# Patient Record
Sex: Male | Born: 1975
Health system: Southern US, Community
[De-identification: ages and names within clinical notes are randomized; demographics above are authoritative.]

## PROBLEM LIST (undated history)

## (undated) ENCOUNTER — Emergency Department: Discharge status: Absent without leave | Payer: Self-pay

## (undated) DIAGNOSIS — I10 Essential (primary) hypertension: Secondary | ICD-10-CM

## (undated) DIAGNOSIS — T7840XA Allergy, unspecified, initial encounter: Secondary | ICD-10-CM

## (undated) DIAGNOSIS — G4730 Sleep apnea, unspecified: Secondary | ICD-10-CM

## (undated) DIAGNOSIS — J189 Pneumonia, unspecified organism: Secondary | ICD-10-CM

## (undated) DIAGNOSIS — E7401 von Gierke disease: Secondary | ICD-10-CM

## (undated) DIAGNOSIS — M1990 Unspecified osteoarthritis, unspecified site: Secondary | ICD-10-CM

## (undated) DIAGNOSIS — K602 Anal fissure, unspecified: Secondary | ICD-10-CM

## (undated) DIAGNOSIS — F329 Major depressive disorder, single episode, unspecified: Secondary | ICD-10-CM

## (undated) DIAGNOSIS — F32A Depression, unspecified: Secondary | ICD-10-CM

## (undated) DIAGNOSIS — F419 Anxiety disorder, unspecified: Secondary | ICD-10-CM

## (undated) DIAGNOSIS — E785 Hyperlipidemia, unspecified: Secondary | ICD-10-CM

## (undated) HISTORY — DX: Anxiety disorder, unspecified: F41.9

## (undated) HISTORY — DX: Unspecified osteoarthritis, unspecified site: M19.90

## (undated) HISTORY — DX: Hyperlipidemia, unspecified: E78.5

## (undated) HISTORY — DX: Von Gierke disease: E74.01

## (undated) HISTORY — DX: Allergy, unspecified, initial encounter: T78.40XA

## (undated) HISTORY — DX: Essential (primary) hypertension: I10

## (undated) HISTORY — DX: Anal fissure, unspecified: K60.2

## (undated) HISTORY — DX: Depression, unspecified: F32.A

## (undated) HISTORY — PX: APPENDECTOMY: SHX54

## (undated) HISTORY — DX: Sleep apnea, unspecified: G47.30

## (undated) HISTORY — DX: Major depressive disorder, single episode, unspecified: F32.9

---

## 1997-08-16 ENCOUNTER — Emergency Department (HOSPITAL_COMMUNITY): Admission: EM | Admit: 1997-08-16 | Discharge: 1997-08-16 | Payer: Self-pay | Admitting: Internal Medicine

## 1998-11-19 ENCOUNTER — Emergency Department (HOSPITAL_COMMUNITY): Admission: EM | Admit: 1998-11-19 | Discharge: 1998-11-19 | Payer: Self-pay | Admitting: Emergency Medicine

## 2001-11-07 ENCOUNTER — Emergency Department (HOSPITAL_COMMUNITY): Admission: EM | Admit: 2001-11-07 | Discharge: 2001-11-07 | Payer: Self-pay | Admitting: Emergency Medicine

## 2002-09-10 ENCOUNTER — Encounter: Admission: RE | Admit: 2002-09-10 | Discharge: 2002-09-10 | Payer: Self-pay | Admitting: Internal Medicine

## 2002-09-10 ENCOUNTER — Encounter: Payer: Self-pay | Admitting: Internal Medicine

## 2003-08-16 ENCOUNTER — Emergency Department (HOSPITAL_COMMUNITY): Admission: EM | Admit: 2003-08-16 | Discharge: 2003-08-17 | Payer: Self-pay | Admitting: Emergency Medicine

## 2003-08-22 ENCOUNTER — Encounter: Admission: RE | Admit: 2003-08-22 | Discharge: 2003-08-22 | Payer: Self-pay | Admitting: Internal Medicine

## 2004-04-12 ENCOUNTER — Encounter: Admission: RE | Admit: 2004-04-12 | Discharge: 2004-04-12 | Payer: Self-pay | Admitting: Internal Medicine

## 2005-04-18 ENCOUNTER — Ambulatory Visit (HOSPITAL_BASED_OUTPATIENT_CLINIC_OR_DEPARTMENT_OTHER): Admission: RE | Admit: 2005-04-18 | Discharge: 2005-04-18 | Payer: Self-pay | Admitting: General Surgery

## 2005-04-18 ENCOUNTER — Encounter (INDEPENDENT_AMBULATORY_CARE_PROVIDER_SITE_OTHER): Payer: Self-pay | Admitting: Specialist

## 2006-02-07 HISTORY — PX: CYSTECTOMY: SUR359

## 2007-12-02 ENCOUNTER — Emergency Department (HOSPITAL_BASED_OUTPATIENT_CLINIC_OR_DEPARTMENT_OTHER): Admission: EM | Admit: 2007-12-02 | Discharge: 2007-12-02 | Payer: Self-pay | Admitting: Emergency Medicine

## 2007-12-31 ENCOUNTER — Ambulatory Visit (HOSPITAL_BASED_OUTPATIENT_CLINIC_OR_DEPARTMENT_OTHER): Admission: RE | Admit: 2007-12-31 | Discharge: 2007-12-31 | Payer: Self-pay | Admitting: Unknown Physician Specialty

## 2008-10-07 ENCOUNTER — Emergency Department (HOSPITAL_BASED_OUTPATIENT_CLINIC_OR_DEPARTMENT_OTHER): Admission: EM | Admit: 2008-10-07 | Discharge: 2008-10-07 | Payer: Self-pay | Admitting: Emergency Medicine

## 2010-01-07 ENCOUNTER — Ambulatory Visit: Payer: Self-pay | Admitting: Cardiovascular Disease

## 2010-02-28 ENCOUNTER — Encounter: Payer: Self-pay | Admitting: Internal Medicine

## 2010-04-06 ENCOUNTER — Ambulatory Visit (INDEPENDENT_AMBULATORY_CARE_PROVIDER_SITE_OTHER): Payer: BC Managed Care – PPO | Admitting: Cardiovascular Disease

## 2010-04-06 DIAGNOSIS — I119 Hypertensive heart disease without heart failure: Secondary | ICD-10-CM

## 2010-05-15 LAB — CBC
HCT: 44.5 % (ref 39.0–52.0)
Hemoglobin: 15.2 g/dL (ref 13.0–17.0)
MCHC: 34.1 g/dL (ref 30.0–36.0)
MCV: 88.7 fL (ref 78.0–100.0)
RDW: 12.1 % (ref 11.5–15.5)

## 2010-05-15 LAB — GLUCOSE, CAPILLARY: Glucose-Capillary: 87 mg/dL (ref 70–99)

## 2010-05-15 LAB — DIFFERENTIAL
Basophils Absolute: 0.1 10*3/uL (ref 0.0–0.1)
Basophils Relative: 1 % (ref 0–1)
Eosinophils Relative: 1 % (ref 0–5)
Lymphocytes Relative: 23 % (ref 12–46)
Neutro Abs: 6 10*3/uL (ref 1.7–7.7)

## 2010-05-15 LAB — BASIC METABOLIC PANEL
BUN: 13 mg/dL (ref 6–23)
Chloride: 101 mEq/L (ref 96–112)
GFR calc Af Amer: >60 mL/min (ref >60)
GFR calc non Af Amer: >60 mL/min (ref >60)
Potassium: 4.1 mEq/L (ref 3.5–5.1)

## 2010-05-31 ENCOUNTER — Other Ambulatory Visit: Payer: Self-pay | Admitting: *Deleted

## 2010-05-31 DIAGNOSIS — E7800 Pure hypercholesterolemia, unspecified: Secondary | ICD-10-CM

## 2010-06-02 ENCOUNTER — Encounter: Payer: Self-pay | Admitting: Cardiovascular Disease

## 2010-06-02 DIAGNOSIS — F419 Anxiety disorder, unspecified: Secondary | ICD-10-CM | POA: Insufficient documentation

## 2010-06-02 DIAGNOSIS — F329 Major depressive disorder, single episode, unspecified: Secondary | ICD-10-CM | POA: Insufficient documentation

## 2010-06-08 ENCOUNTER — Telehealth: Payer: Self-pay | Admitting: *Deleted

## 2010-06-08 ENCOUNTER — Ambulatory Visit: Payer: BC Managed Care – PPO | Admitting: Cardiovascular Disease

## 2010-06-08 ENCOUNTER — Other Ambulatory Visit: Payer: BC Managed Care – PPO | Admitting: *Deleted

## 2010-06-08 NOTE — Telephone Encounter (Signed)
Sent missed visit and fasting lab letter.Alfonso Ramus RN

## 2010-06-25 NOTE — Op Note (Signed)
NAMEEUSTACE, HUR              ACCOUNT NO.:  [id number]   MEDICAL RECORD NO.:  [id number]          PATIENT TYPE:  AMB   LOCATION:  DSC                          FACILITY:  [facility]   PHYSICIAN:  Ollen Gross. Vernell Morgans, M.D. DATE OF BIRTH:  Oct 04, 1975   DATE OF PROCEDURE:  04/18/2005  DATE OF DISCHARGE:  04/18/2005                                 OPERATIVE REPORT   PREOPERATIVE DIAGNOSIS:  Sebaceous cyst of the left axilla.   POSTOPERATIVE DIAGNOSIS:  Sebaceous cyst of the left axilla.   PROCEDURE:  Excision of sebaceous cyst from left axilla.   SURGEON:  Ollen Gross. Carolynne Edouard, M.D.   ANESTHESIA:  General via LMA.   DESCRIPTION OF PROCEDURE:  After informed consent was obtained, the patient  was brought to the operating room and placed in the supine position on the  operating table.  After adequate induction of general anesthesia, the  patient's left axilla was prepped with Betadine and draped in the usual  sterile manner.  The mass in the left axilla was palpable.  An elliptical  skin incision was made overlying this mass.  This incision was carried down  through the skin and into the subcutaneous tissue of the axilla sharply with  the electrocautery. The mass itself was completely excised in a  circumferential manner from the rest of subcutaneous fatty tissue. Once it  was removed, it was sent to pathology for further evaluation.  Hemostasis  was achieved using Bovie electrocautery.  The wound was irrigated with  copious amounts of saline.  The deep layer of the wound was then closed with  interrupted 3-0 Vicryl stitches and the skin was with a closed with a  running for Monocryl subcuticular stitch. Dermabond dressing was applied.  The patient tolerated the procedure well.  At the end of the case, all  needle, sponge and instrument counts were correct.  The patient was then  awakened and taken to the recovery room in stable condition.      Ollen Gross. Vernell Morgans, M.D.  Electronically  Signed     PST/MEDQ  D:  04/19/2005  T:  04/20/2005  Job:  [id number]

## 2010-11-09 LAB — CBC: Hemoglobin: 14.5

## 2010-11-09 LAB — DIFFERENTIAL
Basophils Absolute: 0
Eosinophils Relative: 2
Lymphs Abs: 2.2
Monocytes Absolute: 0.6

## 2010-11-09 LAB — COMPREHENSIVE METABOLIC PANEL
ALT: 20
Alkaline Phosphatase: 68
CO2: 27
Calcium: 9.3
Chloride: 103
GFR calc Af Amer: >60
GFR calc non Af Amer: >60
Sodium: 139
Total Protein: 7.2

## 2010-11-09 LAB — CK: Total CK: 121

## 2010-11-09 LAB — LIPASE, BLOOD: Lipase: 178

## 2010-11-09 LAB — SEDIMENTATION RATE: Sed Rate: 0

## 2010-12-07 ENCOUNTER — Telehealth: Payer: Self-pay | Admitting: Nurse Practitioner

## 2010-12-07 NOTE — Telephone Encounter (Signed)
Paul Freeman called in this evening stating that he wasn't feeling 'great,' and checked his bp and noted that it was 160/110.  He's wondering what he should do.  i advised to take an additional lisinopril 20mg  x 1 now and call the office in the am to get an appt with lori gerhardt, np or dr. Elease Hashimoto in the near future as it has been some time since he was last seen.  He verbalized understanding and plans to call the office in am.

## 2010-12-08 ENCOUNTER — Telehealth: Payer: Self-pay | Admitting: Cardiovascular Disease

## 2010-12-08 MED ORDER — NEBIVOLOL HCL 20 MG PO TABS: 20.0000 mg | ORAL_TABLET | Freq: Two times a day (BID) | ORAL | Status: DC

## 2010-12-08 NOTE — Telephone Encounter (Signed)
Called, will discuss with dr Ian Bushman

## 2010-12-08 NOTE — Telephone Encounter (Signed)
Spoke wih pt and confirmed bp issues, spoke with dr Elease Hashimoto and he advised him to take an extra bystolic daily/ monitor bp daily and have f/u app. Pt advised to call with questions and concerns.

## 2010-12-08 NOTE — Telephone Encounter (Signed)
Pt has a elevated b/p last night at 8:30p it was 167/110 he has not taken this am. Pt having headaches and been hot.

## 2010-12-08 NOTE — Telephone Encounter (Signed)
Can you address

## 2010-12-29 ENCOUNTER — Ambulatory Visit (INDEPENDENT_AMBULATORY_CARE_PROVIDER_SITE_OTHER): Payer: BC Managed Care – PPO | Admitting: Cardiovascular Disease

## 2010-12-29 ENCOUNTER — Encounter: Payer: Self-pay | Admitting: Cardiovascular Disease

## 2010-12-29 VITALS — BP 135/85 | HR 92 | Resp 18 | Ht 74.0 in | Wt 278.4 lb

## 2010-12-29 DIAGNOSIS — I10 Essential (primary) hypertension: Secondary | ICD-10-CM

## 2010-12-29 MED ORDER — HYDROCHLOROTHIAZIDE 25 MG PO TABS: 25.0000 mg | ORAL_TABLET | Freq: Every day | ORAL | Status: DC

## 2010-12-29 MED ORDER — POTASSIUM CHLORIDE ER 10 MEQ PO TBCR: 10.0000 meq | EXTENDED_RELEASE_TABLET | Freq: Every day | ORAL | Status: DC

## 2010-12-29 MED ORDER — NEBIVOLOL HCL 20 MG PO TABS: 20.0000 mg | ORAL_TABLET | Freq: Two times a day (BID) | ORAL | Status: DC

## 2010-12-29 MED ORDER — POTASSIUM CHLORIDE ER 10 MEQ PO TBCR: 10.0000 meq | EXTENDED_RELEASE_TABLET | Freq: Two times a day (BID) | ORAL | Status: DC

## 2010-12-29 MED ORDER — LISINOPRIL 20 MG PO TABS: 20.0000 mg | ORAL_TABLET | Freq: Two times a day (BID) | ORAL | Status: DC

## 2010-12-29 NOTE — Progress Notes (Signed)
Paul Freeman Date of Birth  03-28-1975 [facility] [address]    [address] [place], [place]  [address] [phone]  Fax  [phone]  History of Present Illness:  Paul Freeman is a 35 year old gentleman with a history of hypertension. He has had some problems with elevated blood pressures recently. We increased his lisinopril and his Bystolic. He states his blood pressure supplement elevated.  He's not getting any exercise. 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 He's been trying to avoid eating extra salt.  He quit smoking 3 weeks ago.  He also found that  his testosterone levels were low. He's been getting testosterone injections  Current Outpatient Prescriptions on File Prior to Visit  Medication Sig Dispense Refill  . fish oil-omega-3 fatty acids 1000 MG capsule Take 2 g by mouth daily. 2 DAILY       . lisinopril (PRINIVIL,ZESTRIL) 20 MG tablet Take 20 mg by mouth 2 (two) times daily.       . nebivolol 20 MG TABS Take 1 tablet (20 mg total) by mouth 2 (two) times daily.      . propranolol (INDERAL) 10 MG tablet Take 10 mg by mouth as needed.          Allergies  Allergen Reactions  . Penicillins     Past Medical History  Diagnosis Date  . Hypertension   . Anxiety   . Depression     No past surgical history on file.  History  Smoking status  . Current Everyday Smoker -- 0.5 packs/day  Smokeless tobacco  . Not on file    History  Alcohol Use No    No family history on file.  Reviw of Systems:  Reviewed in the HPI.  All other systems are negative.  Physical Exam: BP 135/85  Pulse 92  Resp 18  Ht 6\' 2"  (1.88 m)  Wt 278 lb 6.4 oz (126.281 kg)  BMI 35.74 kg/m2 The patient is alert and oriented x 3.  The mood and affect are normal.   Skin: warm and dry.  Color is normal.    HEENT:   Normocephalic/atraumatic. He is normal carotids. No JVD.  Lungs: His lung exam is clear.   Heart: Regular S1-S2.     Abdomen: The abdomen is soft it is no hepatomegaly. He has good bowel sounds.  Extremities:  No clubbing cyanosis or edema.  Neuro:  Exam is nonfocal. Gait is normal.    ECG: Normal sinus rhythm. He has no ST or T wave changes. Has early repolarization changes.  Assessment / Plan:

## 2010-12-29 NOTE — Assessment & Plan Note (Signed)
His diastolic blood pressure remains mildly elevated. We'll add HCTZ 25 mg a day as well as potassium chloride 10 mEq a day. We'll recheck him again in 3 months. We'll check basic metabolic profile at that time.

## 2010-12-29 NOTE — Patient Instructions (Signed)
Your physician recommends that you schedule a follow-up appointment in: 3 months  Your physician has recommended you make the following change in your medication:   1) start HCTZ 25 mg one time in morning 2) start potassium 10 meq one time daily in morning  Your physician recommends that you return for lab work in: 3 months/ bmp

## 2011-01-10 ENCOUNTER — Telehealth: Payer: Self-pay | Admitting: Cardiology

## 2011-01-10 NOTE — Telephone Encounter (Signed)
Call from [facility] to clarify Lisinopril 20 mg BID per last OV; and K-dur 10 meq daily per last OV. They will dispense the additional medication for Lisinopril.

## 2011-01-10 NOTE — Telephone Encounter (Signed)
New message:  Has a question about her potassium medication and dosage/  Please use reference number [id number].

## 2011-03-06 ENCOUNTER — Encounter (HOSPITAL_BASED_OUTPATIENT_CLINIC_OR_DEPARTMENT_OTHER): Payer: Self-pay | Admitting: *Deleted

## 2011-03-06 ENCOUNTER — Emergency Department (HOSPITAL_BASED_OUTPATIENT_CLINIC_OR_DEPARTMENT_OTHER)
Admission: EM | Admit: 2011-03-06 | Discharge: 2011-03-06 | Disposition: A | Discharge status: Home / Self Care | Payer: BC Managed Care – PPO | Attending: Emergency Medicine | Admitting: Emergency Medicine

## 2011-03-06 DIAGNOSIS — IMO0001 Reserved for inherently not codable concepts without codable children: Secondary | ICD-10-CM | POA: Insufficient documentation

## 2011-03-06 DIAGNOSIS — I10 Essential (primary) hypertension: Secondary | ICD-10-CM | POA: Insufficient documentation

## 2011-03-06 DIAGNOSIS — J111 Influenza due to unidentified influenza virus with other respiratory manifestations: Secondary | ICD-10-CM | POA: Insufficient documentation

## 2011-03-06 DIAGNOSIS — F341 Dysthymic disorder: Secondary | ICD-10-CM | POA: Insufficient documentation

## 2011-03-06 LAB — RAPID STREP SCREEN (MED CTR MEBANE ONLY): Streptococcus, Group A Screen (Direct): NEGATIVE

## 2011-03-06 MED ORDER — OSELTAMIVIR PHOSPHATE 75 MG PO CAPS: 75.0000 mg | ORAL_CAPSULE | Freq: Two times a day (BID) | ORAL | Status: AC

## 2011-03-06 MED ORDER — OSELTAMIVIR PHOSPHATE 75 MG PO CAPS: 75.0000 mg | ORAL_CAPSULE | Freq: Two times a day (BID) | ORAL | Status: DC

## 2011-03-06 NOTE — ED Provider Notes (Signed)
History     CSN: [id number]  Arrival date & time 03/06/11  1227   First MD Initiated Contact with Patient 03/06/11 1316      Chief Complaint  Patient presents with  . Cough  . Generalized Body Aches    (Consider location/radiation/quality/duration/timing/severity/associated sxs/prior treatment) Patient is a 36 y.o. male presenting with cough. The history is provided by the patient.  Cough This is a new problem. The current episode started yesterday. The problem occurs constantly. The problem has been gradually worsening. The cough is non-productive. The maximum temperature recorded prior to his arrival was 100 to 100.9 F. Associated symptoms include chills, sweats, ear congestion, ear pain, rhinorrhea and sore throat. Pertinent negatives include no chest pain, no shortness of breath and no wheezing. He has tried decongestants for the symptoms. The treatment provided no relief. He is a smoker. His past medical history does not include bronchitis or pneumonia.    Past Medical History  Diagnosis Date  . Hypertension   . Anxiety   . Depression     Past Surgical History  Procedure Date  . Appendectomy     No family history on file.  History  Substance Use Topics  . Smoking status: Former Smoker -- 0.5 packs/day    Quit date: 09/06/2010  . Smokeless tobacco: Not on file  . Alcohol Use: No      Review of Systems  Constitutional: Positive for chills.  HENT: Positive for ear pain, sore throat and rhinorrhea.   Respiratory: Positive for cough. Negative for shortness of breath and wheezing.   Cardiovascular: Negative for chest pain.  All other systems reviewed and are negative.    Allergies  Penicillins  Home Medications   Current Outpatient Rx  Name Route Sig Dispense Refill  . OMEGA-3 FATTY ACIDS 1000 MG PO CAPS Oral Take 2 g by mouth daily. 2 DAILY     . HYDROCHLOROTHIAZIDE 25 MG PO TABS Oral Take 1 tablet (25 mg total) by mouth daily. 90 tablet 3  . LISINOPRIL  20 MG PO TABS Oral Take 1 tablet (20 mg total) by mouth 2 (two) times daily. 180 tablet 3  . NEBIVOLOL HCL 20 MG PO TABS Oral Take 1 tablet (20 mg total) by mouth 2 (two) times daily. 180 tablet 3  . POTASSIUM CHLORIDE ER 10 MEQ PO TBCR Oral Take 1 tablet (10 mEq total) by mouth daily. 90 tablet 3  . PROPRANOLOL HCL 10 MG PO TABS Oral Take 10 mg by mouth as needed.      . TESTOSTERONE ENANTHATE 200 MG/ML IM OIL Intramuscular Inject into the muscle every 14 (fourteen) days. For IM use only       BP 131/79  Pulse 101  Temp(Src) 98.7 F (37.1 C) (Oral)  Resp 20  Ht 6\' 2"  (1.88 m)  Wt 270 lb (122.471 kg)  BMI 34.67 kg/m2  SpO2 99%  Physical Exam  Nursing note and vitals reviewed. Constitutional: He is oriented to person, place, and time. He appears well-developed and well-nourished. No distress.  HENT:  Head: Normocephalic and atraumatic.  Right Ear: Tympanic membrane and ear canal normal.  Left Ear: Tympanic membrane and ear canal normal.  Nose: Mucosal edema and rhinorrhea present.  Mouth/Throat: Posterior oropharyngeal erythema present. No oropharyngeal exudate or posterior oropharyngeal edema.  Eyes: Conjunctivae and EOM are normal. Pupils are equal, round, and reactive to light.  Neck: Normal range of motion. Neck supple.  Cardiovascular: Normal rate, regular rhythm and intact distal pulses.  No murmur heard. Pulmonary/Chest: Effort normal and breath sounds normal. No respiratory distress. He has no wheezes. He has no rales.  Abdominal: Soft. He exhibits no distension. There is no tenderness. There is no rebound and no guarding.  Musculoskeletal: Normal range of motion. He exhibits no edema and no tenderness.  Lymphadenopathy:    He has no cervical adenopathy.  Neurological: He is alert and oriented to person, place, and time.  Skin: Skin is warm and dry. No rash noted. No erythema.  Psychiatric: He has a normal mood and affect. His behavior is normal.    ED Course    Procedures (including critical care time)   Labs Reviewed  RAPID STREP SCREEN   No results found.   No diagnosis found.    MDM   Pt with symptoms consistent with influenza.  Normal exam here but is febrile.  No signs of breathing difficulty  No signs of strep pharyngitis, otitis or abnormal abdominal findings.   rapid strep wnl.  Will continue antipyretica and rest and fluids and return for any further problems and tamiflu.         Gwyneth Sprout, MD 03/06/11 1425

## 2011-03-06 NOTE — ED Notes (Signed)
Cough productive for yellow sputum, body aches , chills x 1 day

## 2011-03-06 NOTE — ED Notes (Signed)
Patient c/o generalized aches, HA, spitting up congestion

## 2011-04-05 ENCOUNTER — Ambulatory Visit (INDEPENDENT_AMBULATORY_CARE_PROVIDER_SITE_OTHER): Payer: BC Managed Care – PPO | Admitting: Cardiovascular Disease

## 2011-04-05 ENCOUNTER — Encounter: Payer: Self-pay | Admitting: Cardiovascular Disease

## 2011-04-05 DIAGNOSIS — G4730 Sleep apnea, unspecified: Secondary | ICD-10-CM

## 2011-04-05 DIAGNOSIS — R079 Chest pain, unspecified: Secondary | ICD-10-CM

## 2011-04-05 NOTE — Assessment & Plan Note (Addendum)
Paul Freeman Presents with an hour-long left-sided chest pain. It occurred while he was driving and it lasted for about an hour. He has a long history of smoking but he did quit smoking 6 months ago.  The chest pain was described as an intense pressure. It radiated from left-sided and then to the right side.  He has a long history hypertension.  We performed a stress echo on him in the past and he was not able to reach target heart rate. For that reason I think it would be to perform a [employer].     The study will have to be done over a two-day period because of his weight 275  pounds.

## 2011-04-05 NOTE — Progress Notes (Signed)
Sincere F Brimage Date of Birth  12-23-1975 [facility]     [place]  [address]    [address]   [address] [place], [place]  [address]    [place], [place]  [address] [phone]  Fax  [phone]  [phone]  Fax [phone]  Problem List: 1. Hypertension 2.  History of Present Illness:  Paul Freeman is doing fairly well.  He did have some chest pain 4 days ago while driving to [place] to race his [place] [employer].  The pain lasted about 1 hour and has not recurred.  He stopped his HCTZ because it made him urinate too much.  He is under lots of stress.  He quit smoking 6 ago.  Current Outpatient Prescriptions on File Prior to Visit  Medication Sig Dispense Refill  . fish oil-omega-3 fatty acids 1000 MG capsule Take 2 g by mouth daily. 2 DAILY       . lisinopril (PRINIVIL,ZESTRIL) 20 MG tablet Take 1 tablet (20 mg total) by mouth 2 (two) times daily.  180 tablet  3  . Nebivolol HCl 20 MG TABS Take 1 tablet (20 mg total) by mouth 2 (two) times daily.  180 tablet  3  . propranolol (INDERAL) 10 MG tablet Take 10 mg by mouth as needed.          Allergies  Allergen Reactions  . Penicillins     Past Medical History  Diagnosis Date  . Hypertension   . Anxiety   . Depression     Past Surgical History  Procedure Date  . Appendectomy     History  Smoking status  . Former Smoker -- 0.5 packs/day  . Quit date: 09/06/2010  Smokeless tobacco  . Not on file    History  Alcohol Use No    No family history on file.  Reviw of Systems:  Reviewed in the HPI.  All other systems are negative.  Physical Exam: Blood pressure 130/82, pulse 75, height 6\' 2"  (1.88 m), weight 275 lb 12.8 oz (125.102 kg). General: Well developed, well nourished, in no acute distress.  Head: Normocephalic, atraumatic, sclera non-icteric, mucus membranes are moist,   Neck: Supple. Carotids are 2 + without bruits. No JVD  Lungs: Clear bilaterally to auscultation.  Heart: regular  rate  With normal  S1 S2. No murmurs, gallops or rubs.  Abdomen: Soft, non-tender, non-distended with normal bowel sounds. No hepatomegaly. No rebound/guarding. No masses.  Msk:  Strength and tone are normal  Extremities: No clubbing or cyanosis. No edema.  Distal pedal pulses are 2+ and equal bilaterally.  Neuro: Alert and oriented X 3. Moves all extremities spontaneously.  Psych:  Responds to questions appropriately with a normal affect.  ECG:  Assessment / Plan:

## 2011-04-05 NOTE — Patient Instructions (Addendum)
Your physician recommends that you schedule a follow-up appointment in: 3 months    Your physician has requested that you have a lexiscan myoview. Please follow instruction sheet, as given.

## 2011-04-18 ENCOUNTER — Ambulatory Visit (HOSPITAL_COMMUNITY): Payer: BC Managed Care – PPO | Attending: Cardiology | Admitting: Radiology

## 2011-04-18 ENCOUNTER — Other Ambulatory Visit (INDEPENDENT_AMBULATORY_CARE_PROVIDER_SITE_OTHER): Payer: BC Managed Care – PPO

## 2011-04-18 DIAGNOSIS — I10 Essential (primary) hypertension: Secondary | ICD-10-CM

## 2011-04-18 DIAGNOSIS — E7800 Pure hypercholesterolemia, unspecified: Secondary | ICD-10-CM

## 2011-04-18 DIAGNOSIS — Z87891 Personal history of nicotine dependence: Secondary | ICD-10-CM | POA: Insufficient documentation

## 2011-04-18 DIAGNOSIS — R079 Chest pain, unspecified: Secondary | ICD-10-CM | POA: Insufficient documentation

## 2011-04-18 LAB — HEPATIC FUNCTION PANEL
ALT: 48 U/L (ref 0–53)
AST: 27 U/L (ref 0–37)
Albumin: 4.2 g/dL (ref 3.5–5.2)

## 2011-04-18 LAB — LIPID PANEL
Cholesterol: 153 mg/dL (ref 0–200)
LDL Cholesterol: 97 mg/dL (ref 0–99)

## 2011-04-18 LAB — BASIC METABOLIC PANEL
BUN: 13 mg/dL (ref 6–23)
Chloride: 104 mEq/L (ref 96–112)
Glucose, Bld: 100 mg/dL — ABNORMAL HIGH (ref 70–99)
Potassium: 4 mEq/L (ref 3.5–5.1)

## 2011-04-18 MED ORDER — REGADENOSON 0.4 MG/5ML IV SOLN
0.4000 mg | Freq: Once | INTRAVENOUS | Status: AC
  Administered 2011-04-18: 0.4 mg via INTRAVENOUS

## 2011-04-18 MED ORDER — TECHNETIUM TC 99M TETROFOSMIN IV KIT
30.0000 | PACK | Freq: Once | INTRAVENOUS | Status: AC | PRN
  Administered 2011-04-18: 30 via INTRAVENOUS

## 2011-04-18 NOTE — Progress Notes (Signed)
[facility] SITE 3 NUCLEAR MED [address] [place] [address] [phone]  Cardiology Nuclear Med Study  Paul Freeman is a 36 y.o. male [id number] 06/10/1975   Nuclear Med Background Indication for Stress Test:  Evaluation for Ischemia History: '09 ECHO: Stress: (-) NL LVH trace trace MR/TR Cardiac Risk Factors: History of Smoking and Hypertension  Symptoms:  Chest Pain with Exertion    Nuclear Pre-Procedure Caffeine/Decaff Intake:  None NPO After: 7:00pm   Lungs:  clear IV 0.9% NS with Angio Cath:  20g  IV Site: R Antecubital  IV Started by:  Stanton Kidney, EMT-P  Chest Size (in):  48 Cup Size: n/a  Height: 6\' 2"  (1.88 m)  Weight:  268 lb (121.564 kg)  BMI:  Body mass index is 34.41 kg/(m^2). Tech Comments:  Bystolic held since 12 pm yesterday. The patient was going to walk but he was unable due to vasovagal response.    Nuclear Med Study 1 or 2 day study: 2 day  Stress Test Type:  Eugenie Birks  Reading MD: Charlton Haws, MD  Order Authorizing Provider:  P.Nahser  Resting Radionuclide: Technetium 74m Tetrofosmin  Resting Radionuclide Dose: 32.8 mCi on 04/19/11   Stress Radionuclide:  Technetium 60m Tetrofosmin  Stress Radionuclide Dose: 33.0 mCi on 04/18/11           Stress Protocol Rest HR:59 Stress HR: 87  Rest BP: 115/72 Stress BP: 117/40  Exercise Time (min): n/a METS: n/a   Predicted Max HR: 186 bpm % Max HR: 46.77 bpm Rate Pressure Product: 21308   Dose of Adenosine (mg):  n/a Dose of Lexiscan: 0.4 mg  Dose of Atropine (mg): n/a Dose of Dobutamine: n/a mcg/kg/min (at max HR)  Stress Test Technologist: Milana Na, EMT-P  Nuclear Technologist:  Doyne Keel, CNMT     Rest Procedure:  Myocardial perfusion imaging was performed at rest 45 minutes following the intravenous administration of Technetium 62m Tetrofosmin. Rest ECG: Sinus Bradycardia  Stress Procedure:  The patient received IV Lexiscan 0.4 mg over 15-seconds.  Technetium 16m  Tetrofosmin injected at 30-seconds.  There were no significant changes and sob with Lexiscan.  Quantitative spect images were obtained after a 45 minute delay. Stress ECG: No significant change from baseline ECG  QPS Raw Data Images:  Normal; no motion artifact; normal heart/lung ratio. Stress Images:  Normal homogeneous uptake in all areas of the myocardium. Rest Images:  Normal homogeneous uptake in all areas of the myocardium. Subtraction (SDS):  Normal Transient Ischemic Dilatation (Normal <1.22):  0.90 Lung/Heart Ratio (Normal <0.45):  0.36  Quantitative Gated Spect Images QGS EDV:  138 ml QGS ESV:  55 ml QGS cine images:  NL LV Function; NL Wall Motion QGS EF: 60%  Impression Exercise Capacity:  Lexiscan with no exercise. BP Response:  Normal blood pressure response. Clinical Symptoms:  There is dyspnea. ECG Impression:  No significant ST segment change suggestive of ischemia. Comparison with Prior Nuclear Study: No images to compare  Overall Impression:  Normal stress nuclear study.    Charlton Haws

## 2011-04-19 ENCOUNTER — Ambulatory Visit (HOSPITAL_COMMUNITY): Payer: BC Managed Care – PPO | Attending: Cardiovascular Disease

## 2011-04-19 DIAGNOSIS — R0989 Other specified symptoms and signs involving the circulatory and respiratory systems: Secondary | ICD-10-CM

## 2011-04-19 MED ORDER — TECHNETIUM TC 99M TETROFOSMIN IV KIT
30.0000 | PACK | Freq: Once | INTRAVENOUS | Status: AC | PRN
  Administered 2011-04-19: 30 via INTRAVENOUS

## 2011-04-20 NOTE — Progress Notes (Signed)
Addended by: Domenic Polite on: 04/20/2011 09:37 AM   Modules accepted: Orders

## 2011-05-05 ENCOUNTER — Emergency Department (HOSPITAL_BASED_OUTPATIENT_CLINIC_OR_DEPARTMENT_OTHER)
Admission: EM | Admit: 2011-05-05 | Discharge: 2011-05-05 | Disposition: A | Discharge status: Home / Self Care | Payer: BC Managed Care – PPO | Attending: Emergency Medicine | Admitting: Emergency Medicine

## 2011-05-05 ENCOUNTER — Encounter (HOSPITAL_BASED_OUTPATIENT_CLINIC_OR_DEPARTMENT_OTHER): Payer: Self-pay | Admitting: *Deleted

## 2011-05-05 DIAGNOSIS — I10 Essential (primary) hypertension: Secondary | ICD-10-CM | POA: Insufficient documentation

## 2011-05-05 DIAGNOSIS — F3289 Other specified depressive episodes: Secondary | ICD-10-CM | POA: Insufficient documentation

## 2011-05-05 DIAGNOSIS — Z87891 Personal history of nicotine dependence: Secondary | ICD-10-CM | POA: Insufficient documentation

## 2011-05-05 DIAGNOSIS — Z880 Allergy status to penicillin: Secondary | ICD-10-CM | POA: Insufficient documentation

## 2011-05-05 DIAGNOSIS — F329 Major depressive disorder, single episode, unspecified: Secondary | ICD-10-CM | POA: Insufficient documentation

## 2011-05-05 DIAGNOSIS — F411 Generalized anxiety disorder: Secondary | ICD-10-CM | POA: Insufficient documentation

## 2011-05-05 DIAGNOSIS — J329 Chronic sinusitis, unspecified: Secondary | ICD-10-CM | POA: Insufficient documentation

## 2011-05-05 MED ORDER — AZITHROMYCIN 250 MG PO TABS: 250.0000 mg | ORAL_TABLET | Freq: Every day | ORAL | Status: AC

## 2011-05-05 MED ORDER — FLUTICASONE PROPIONATE 50 MCG/ACT NA SUSP: 2.0000 | Freq: Every day | NASAL | Status: DC

## 2011-05-05 MED ORDER — KETOROLAC TROMETHAMINE 60 MG/2ML IM SOLN
60.0000 mg | Freq: Once | INTRAMUSCULAR | Status: AC
  Administered 2011-05-05: 60 mg via INTRAMUSCULAR

## 2011-05-05 MED ORDER — KETOROLAC TROMETHAMINE 60 MG/2ML IM SOLN
INTRAMUSCULAR | Status: AC
  Filled 2011-05-05: qty 2

## 2011-05-05 NOTE — Discharge Instructions (Signed)

## 2011-05-05 NOTE — ED Notes (Signed)
Drove himself here. Head pain feels like sinusitis.

## 2011-05-05 NOTE — ED Provider Notes (Signed)
Medical screening examination/treatment/procedure(s) were performed by non-physician practitioner and as supervising physician I was immediately available for consultation/collaboration.   Abbygail Willhoite, MD 05/05/11 2326 

## 2011-05-05 NOTE — ED Provider Notes (Signed)
History     CSN: [id number]  Arrival date & time 05/05/11  4098   First MD Initiated Contact with Patient 05/05/11 1847      Chief Complaint  Patient presents with  . Headache    (Consider location/radiation/quality/duration/timing/severity/associated sxs/prior treatment) HPI Comments: Pt states that he has tried otc medications without relief:pt states that he had a cold for a couple of days before the symptoms started  Patient is a 36 y.o. male presenting with headaches. The history is provided by the patient. No language interpreter was used.  Headache  This is a new problem. The current episode started more than 2 days ago. The problem occurs constantly. The problem has not changed since onset.Associated with: congestion and certain positions. The pain is located in the frontal region. The quality of the pain is described as throbbing. The pain is moderate. The pain radiates to the face. Pertinent negatives include no fever, no shortness of breath, no nausea and no vomiting.    Past Medical History  Diagnosis Date  . Hypertension   . Anxiety   . Depression     Past Surgical History  Procedure Date  . Appendectomy     No family history on file.  History  Substance Use Topics  . Smoking status: Former Smoker -- 0.5 packs/day    Quit date: 09/06/2010  . Smokeless tobacco: Not on file  . Alcohol Use: No      Review of Systems  Constitutional: Negative for fever.  Eyes: Negative.   Respiratory: Negative for shortness of breath.   Cardiovascular: Negative.   Gastrointestinal: Negative for nausea and vomiting.  Neurological: Positive for headaches.    Allergies  Penicillins  Home Medications   Current Outpatient Rx  Name Route Sig Dispense Refill  . ALPRAZOLAM 0.5 MG PO TABS Oral Take 0.5 mg by mouth Once daily as needed. For anxiety.    Marland Kitchen LISINOPRIL 20 MG PO TABS Oral Take 20 mg by mouth 2 (two) times daily.    . NEBIVOLOL HCL 20 MG PO TABS Oral Take 20  mg by mouth 2 (two) times daily.    Marland Kitchen PROPRANOLOL HCL 10 MG PO TABS Oral Take 10 mg by mouth daily as needed. For high blood pressure.    . AZITHROMYCIN 250 MG PO TABS Oral Take 1 tablet (250 mg total) by mouth daily. Take first 2 tablets together, then 1 every day until finished. 6 tablet 0  . FLUTICASONE PROPIONATE 50 MCG/ACT NA SUSP Nasal Place 2 sprays into the nose daily. 16 g 2    BP 139/87  Pulse 71  Temp(Src) 98.2 F (36.8 C) (Oral)  Resp 20  Ht 6\' 2"  (1.88 m)  Wt 268 lb (121.564 kg)  BMI 34.41 kg/m2  SpO2 99%  Physical Exam  Nursing note and vitals reviewed. Constitutional: He appears well-developed and well-nourished.  HENT:  Right Ear: External ear normal.  Left Ear: External ear normal.  Nose: Mucosal edema present. Right sinus exhibits frontal sinus tenderness. Left sinus exhibits frontal sinus tenderness.  Mouth/Throat: Oropharynx is clear and moist.       Yellowish green drainage  Cardiovascular: Normal rate and regular rhythm.   Pulmonary/Chest: Effort normal and breath sounds normal.    ED Course  Procedures (including critical care time)  Labs Reviewed - No data to display No results found.   1. Sinusitis       MDM  Pt treated for sinusitis based on history and exam  Teressa Lower, NP 05/05/11 1924

## 2011-05-05 NOTE — ED Notes (Signed)
Headache x 4 days. Has been taking Advil and Tylenol with no relief.

## 2011-05-05 NOTE — ED Notes (Signed)
Patient states that his headache started Sunday night. Patient states that he has tried multiple remedies with no relief. Patent stated that he has tried tylenol sinus today, zyrtec today, Advil last night 800 mg. Patient stated that he usually does not have headaches. Headache seems to be throbbing and pulsating in nature. Patient states that he occasionally gets a sharp pain. Patient has sinus congestion, some sputum expectorated yellowish in color.

## 2011-06-20 ENCOUNTER — Ambulatory Visit: Payer: BC Managed Care – PPO | Admitting: Cardiovascular Disease

## 2011-11-16 ENCOUNTER — Emergency Department (HOSPITAL_BASED_OUTPATIENT_CLINIC_OR_DEPARTMENT_OTHER): Payer: BC Managed Care – PPO

## 2011-11-16 ENCOUNTER — Encounter (HOSPITAL_BASED_OUTPATIENT_CLINIC_OR_DEPARTMENT_OTHER): Payer: Self-pay

## 2011-11-16 ENCOUNTER — Emergency Department (HOSPITAL_BASED_OUTPATIENT_CLINIC_OR_DEPARTMENT_OTHER)
Admission: EM | Admit: 2011-11-16 | Discharge: 2011-11-16 | Disposition: A | Discharge status: Home / Self Care | Payer: BC Managed Care – PPO | Attending: Emergency Medicine | Admitting: Emergency Medicine

## 2011-11-16 DIAGNOSIS — R059 Cough, unspecified: Secondary | ICD-10-CM | POA: Insufficient documentation

## 2011-11-16 DIAGNOSIS — I10 Essential (primary) hypertension: Secondary | ICD-10-CM | POA: Insufficient documentation

## 2011-11-16 DIAGNOSIS — Z87891 Personal history of nicotine dependence: Secondary | ICD-10-CM | POA: Insufficient documentation

## 2011-11-16 DIAGNOSIS — F329 Major depressive disorder, single episode, unspecified: Secondary | ICD-10-CM | POA: Insufficient documentation

## 2011-11-16 DIAGNOSIS — F3289 Other specified depressive episodes: Secondary | ICD-10-CM | POA: Insufficient documentation

## 2011-11-16 DIAGNOSIS — R05 Cough: Secondary | ICD-10-CM | POA: Insufficient documentation

## 2011-11-16 DIAGNOSIS — F411 Generalized anxiety disorder: Secondary | ICD-10-CM | POA: Insufficient documentation

## 2011-11-16 DIAGNOSIS — R51 Headache: Secondary | ICD-10-CM | POA: Insufficient documentation

## 2011-11-16 DIAGNOSIS — Z79899 Other long term (current) drug therapy: Secondary | ICD-10-CM | POA: Insufficient documentation

## 2011-11-16 HISTORY — DX: Pneumonia, unspecified organism: J18.9

## 2011-11-16 LAB — COMPREHENSIVE METABOLIC PANEL
ALT: 53 U/L (ref 0–53)
Alkaline Phosphatase: 63 U/L (ref 39–117)
BUN: 15 mg/dL (ref 6–23)
CO2: 28 mEq/L (ref 19–32)
Chloride: 101 mEq/L (ref 96–112)
GFR calc Af Amer: >90 mL/min (ref >90)
GFR calc non Af Amer: >90 mL/min (ref >90)
Glucose, Bld: 100 mg/dL — ABNORMAL HIGH (ref 70–99)
Potassium: 4.4 mEq/L (ref 3.5–5.1)
Total Bilirubin: 0.9 mg/dL (ref 0.3–1.2)
Total Protein: 7.2 g/dL (ref 6.0–8.3)

## 2011-11-16 LAB — CBC WITH DIFFERENTIAL/PLATELET
Eosinophils Absolute: 0.1 10*3/uL (ref 0.0–0.7)
Hemoglobin: 14.9 g/dL (ref 13.0–17.0)
Lymphocytes Relative: 23 % (ref 12–46)
Lymphs Abs: 1.8 10*3/uL (ref 0.7–4.0)
MCH: 30.1 pg (ref 26.0–34.0)
MCV: 86.5 fL (ref 78.0–100.0)
Monocytes Relative: 6 % (ref 3–12)
Neutrophils Relative %: 69 % (ref 43–77)
RBC: 4.95 MIL/uL (ref 4.22–5.81)
WBC: 7.9 10*3/uL (ref 4.0–10.5)

## 2011-11-16 MED ORDER — DIPHENHYDRAMINE HCL 50 MG/ML IJ SOLN
50.0000 mg | Freq: Once | INTRAMUSCULAR | Status: AC
  Administered 2011-11-16: 50 mg via INTRAVENOUS
  Filled 2011-11-16: qty 1

## 2011-11-16 MED ORDER — PROMETHAZINE HCL 25 MG/ML IJ SOLN
INTRAMUSCULAR | Status: AC
  Administered 2011-11-16: 25 mg
  Filled 2011-11-16: qty 1

## 2011-11-16 MED ORDER — OXYCODONE-ACETAMINOPHEN 5-325 MG PO TABS: 1.0000 | ORAL_TABLET | ORAL | Status: DC | PRN

## 2011-11-16 MED ORDER — HYDROMORPHONE HCL PF 1 MG/ML IJ SOLN
1.0000 mg | Freq: Once | INTRAMUSCULAR | Status: AC
  Administered 2011-11-16: 1 mg via INTRAVENOUS
  Filled 2011-11-16: qty 1

## 2011-11-16 MED ORDER — METHYLPREDNISOLONE SODIUM SUCC 125 MG IJ SOLR
125.0000 mg | Freq: Once | INTRAMUSCULAR | Status: AC
  Administered 2011-11-16: 125 mg via INTRAVENOUS
  Filled 2011-11-16: qty 2

## 2011-11-16 MED ORDER — SODIUM CHLORIDE 0.9 % IV BOLUS (SEPSIS)
1000.0000 mL | Freq: Once | INTRAVENOUS | Status: AC
  Administered 2011-11-16: 1000 mL via INTRAVENOUS

## 2011-11-16 NOTE — ED Notes (Signed)
Patient transported to CT via stretcher.

## 2011-11-16 NOTE — ED Provider Notes (Signed)
History     CSN: [id number]  Arrival date & time 11/16/11  1478   First MD Initiated Contact with Patient 11/16/11 [phone]      Chief Complaint  Patient presents with  . Migraine    (Consider location/radiation/quality/duration/timing/severity/associated sxs/prior treatment) HPI Patient complaining of headache for 2 days with gradual onset. He has had headaches in the past but states that this does feel somewhat different from prior headache that he was seen here 4. This headache came on gradually and is bifrontal in nature. It is dull and pressure like with maximum 8/10. He has taken over-the-counter medicines with some intermittent relief. He also has had some productive cough with subjective fever and chills. He states that he was seen 2 months ago at an urgent care and told that he had pneumonia. He completed the course of antibiotics but has continued to have some productive cough. He quit smoking a year ago. He denies any alcohol use. He has not noted any change in vision, sore throat, chest pain, vomiting, or diarrhea. He has had some nausea. He has not had any focal neurological deficits or difficulty walking. Past Medical History  Diagnosis Date  . Hypertension   . Anxiety   . Depression   . Pneumonia     Past Surgical History  Procedure Date  . Appendectomy     No family history on file.  History  Substance Use Topics  . Smoking status: Former Smoker -- 0.5 packs/day    Quit date: 09/06/2010  . Smokeless tobacco: Not on file  . Alcohol Use: No      Review of Systems  Constitutional: Negative for fever and chills.  HENT: Negative for neck stiffness.   Eyes: Negative for visual disturbance.  Respiratory: Negative for shortness of breath.   Cardiovascular: Negative for chest pain.  Gastrointestinal: Negative for vomiting, diarrhea and blood in stool.  Genitourinary: Negative for dysuria, frequency and decreased urine volume.  Musculoskeletal: Negative for  myalgias and joint swelling.  Skin: Negative for rash.  Neurological: Negative for weakness.  Hematological: Negative for adenopathy.  Psychiatric/Behavioral: Negative for agitation.    Allergies  Penicillins  Home Medications   Current Outpatient Rx  Name Route Sig Dispense Refill  . ALPRAZOLAM 0.5 MG PO TABS Oral Take 0.5 mg by mouth Once daily as needed. For anxiety.    Marland Kitchen FLUTICASONE PROPIONATE 50 MCG/ACT NA SUSP Nasal Place 2 sprays into the nose daily. 16 g 2  . LISINOPRIL 20 MG PO TABS Oral Take 20 mg by mouth 2 (two) times daily.    . NEBIVOLOL HCL 20 MG PO TABS Oral Take 20 mg by mouth 2 (two) times daily.    Marland Kitchen PROPRANOLOL HCL 10 MG PO TABS Oral Take 10 mg by mouth daily as needed. For high blood pressure.      BP 145/93  Pulse 80  Temp 98.2 F (36.8 C) (Oral)  Resp 18  Ht 6\' 2"  (1.88 m)  Wt 270 lb (122.471 kg)  BMI 34.67 kg/m2  SpO2 95%  Physical Exam  Nursing note and vitals reviewed. Constitutional: He is oriented to person, place, and time. He appears well-developed and well-nourished.  HENT:  Head: Normocephalic and atraumatic.  Right Ear: Tympanic membrane, external ear and ear canal normal.  Left Ear: Tympanic membrane and external ear normal.  Nose: Nose normal.  Mouth/Throat: Oropharynx is clear and moist.  Eyes: Conjunctivae normal and EOM are normal. Pupils are equal, round, and reactive to light.  Neck: Normal range of motion.  Cardiovascular: Normal rate, regular rhythm and normal heart sounds.   Pulmonary/Chest: Effort normal and breath sounds normal.  Abdominal: Soft. Bowel sounds are normal.  Musculoskeletal: Normal range of motion.  Neurological: He is alert and oriented to person, place, and time. He has normal reflexes.  Skin: Skin is warm and dry.  Psychiatric: He has a normal mood and affect. His behavior is normal. Judgment and thought content normal.    ED Course  Procedures (including critical care time)  Labs Reviewed    COMPREHENSIVE METABOLIC PANEL - Abnormal; Notable for the following:    Glucose, Bld 100 (*)     All other components within normal limits  CBC WITH DIFFERENTIAL   Dg Chest 2 View  11/16/2011  *RADIOLOGY REPORT*  Clinical Data: Cough  CHEST - 2 VIEW  Comparison: Chest radiograph 04/12/2004  Findings: The heart, mediastinal, and hilar contours are within normal limits.  Pulmonary vascularity is normal.  The lung volumes are low.  No airspace disease, effusion, or pneumothorax. The trachea is midline.  The bony thorax and visualized upper abdomen are unremarkable.  IMPRESSION: Low lung volumes.  No acute findings.   Original Report Authenticated By: Britta Mccreedy, M.D.    Ct Head Wo Contrast  11/16/2011  *RADIOLOGY REPORT*  Clinical Data: Acute onset severe headache  CT HEAD WITHOUT CONTRAST  Technique:  Contiguous axial images were obtained from the base of the skull through the vertex without contrast.  Comparison: None.  Findings:  Ventricles are normal in size and configuration.  There is no mass, hemorrhage, extra-axial fluid collection, or midline shift.  Gray-white compartments are normal.  Bony calvarium appears intact.  The mastoid air cells are clear.  IMPRESSION: Normal study.   Original Report Authenticated By: Arvin Collard. WOODRUFF III, M.D.      No diagnosis found.    MDM  Patient received drill and Solu-Medrol without leak. He additionally received Phenergan without relief. He has had a head CT that did not show any acute abnormality as well as a chest x-Mycheal V[place]hu[place] without any acute infiltrates. He has received 1 mg Dilaudid earlier awaiting his response to   Patient states pain decreased after dilaudid to 3/10.     Hilario Quarry, MD 11/16/11 [phone]

## 2011-11-16 NOTE — ED Notes (Signed)
Pt reports a migraine x 2 weeks. States he had pneumonia 2 months ago and not fully recovered. He continues to cough.

## 2011-12-01 ENCOUNTER — Other Ambulatory Visit (HOSPITAL_COMMUNITY): Payer: Self-pay | Admitting: Endocrinology

## 2011-12-01 DIAGNOSIS — E0590 Thyrotoxicosis, unspecified without thyrotoxic crisis or storm: Secondary | ICD-10-CM

## 2011-12-09 ENCOUNTER — Other Ambulatory Visit: Payer: Self-pay | Admitting: Cardiovascular Disease

## 2011-12-09 MED ORDER — NEBIVOLOL HCL 20 MG PO TABS: 20.0000 mg | ORAL_TABLET | Freq: Every day | ORAL | Status: DC

## 2011-12-09 NOTE — Telephone Encounter (Signed)
Pt needs appointment then refill can be made 

## 2011-12-10 ENCOUNTER — Other Ambulatory Visit: Payer: Self-pay | Admitting: Cardiovascular Disease

## 2011-12-13 ENCOUNTER — Ambulatory Visit (HOSPITAL_COMMUNITY): Payer: BC Managed Care – PPO

## 2011-12-14 ENCOUNTER — Other Ambulatory Visit (HOSPITAL_COMMUNITY): Payer: BC Managed Care – PPO

## 2011-12-15 ENCOUNTER — Other Ambulatory Visit: Payer: Self-pay | Admitting: *Deleted

## 2011-12-15 ENCOUNTER — Encounter (HOSPITAL_COMMUNITY)
Admission: RE | Admit: 2011-12-15 | Discharge: 2011-12-15 | Disposition: A | Discharge status: Home / Self Care | Payer: BC Managed Care – PPO | Source: Ambulatory Visit | Attending: Endocrinology | Admitting: Endocrinology

## 2011-12-15 DIAGNOSIS — E0590 Thyrotoxicosis, unspecified without thyrotoxic crisis or storm: Secondary | ICD-10-CM

## 2011-12-15 NOTE — Telephone Encounter (Signed)
Pt needs appointment then refill can be made 

## 2011-12-16 ENCOUNTER — Encounter (HOSPITAL_COMMUNITY)
Admission: RE | Admit: 2011-12-16 | Discharge: 2011-12-16 | Disposition: A | Discharge status: Home / Self Care | Payer: BC Managed Care – PPO | Source: Ambulatory Visit | Attending: Endocrinology | Admitting: Endocrinology

## 2011-12-16 MED ORDER — SODIUM IODIDE I 131 CAPSULE
10.5000 | Freq: Once | INTRAVENOUS | Status: AC | PRN
  Administered 2011-12-15: 10.5 via ORAL

## 2011-12-16 MED ORDER — SODIUM PERTECHNETATE TC 99M INJECTION
10.5000 | Freq: Once | INTRAVENOUS | Status: AC | PRN
  Administered 2011-12-16: 11 via INTRAVENOUS

## 2012-04-06 ENCOUNTER — Other Ambulatory Visit: Payer: Self-pay | Admitting: *Deleted

## 2012-04-06 MED ORDER — LISINOPRIL 20 MG PO TABS: 20.0000 mg | ORAL_TABLET | Freq: Two times a day (BID) | ORAL | Status: DC

## 2012-04-06 MED ORDER — NEBIVOLOL HCL 20 MG PO TABS: 20.0000 mg | ORAL_TABLET | Freq: Every day | ORAL | Status: DC

## 2012-04-25 ENCOUNTER — Emergency Department
Admission: EM | Admit: 2012-04-25 | Discharge: 2012-04-25 | Disposition: A | Discharge status: Home / Self Care | Payer: BC Managed Care – PPO | Source: Home / Self Care | Attending: Family Medicine | Admitting: Family Medicine

## 2012-04-25 ENCOUNTER — Encounter: Payer: Self-pay | Admitting: *Deleted

## 2012-04-25 DIAGNOSIS — J069 Acute upper respiratory infection, unspecified: Secondary | ICD-10-CM

## 2012-04-25 LAB — POCT RAPID STREP A (OFFICE): Rapid Strep A Screen: NEGATIVE

## 2012-04-25 NOTE — ED Provider Notes (Signed)
History     CSN: [id number]  Arrival date & time 04/25/12  4098   First MD Initiated Contact with Patient 04/25/12 [phone]      Chief Complaint  Patient presents with  . Sore Throat  . Generalized Body Aches   HPI  SORE THROAT  Onset: 2 days  Description: sore throat, generalized malaise, mild cough  Modifying factors: none   Symptoms  Fever:  no URI symptoms: mild Cough: mild Headache: no Rash:  no Swollen glands:   mild Recent Strep Exposure: unsure  LUQ pain: no Heartburn/brash: no Allergy Symptoms: no  Red Flags STD exposure: no Breathing difficulty: no Drooling: no Trismus: no   Past Medical History  Diagnosis Date  . Hypertension   . Anxiety   . Depression   . Pneumonia     Past Surgical History  Procedure Laterality Date  . Appendectomy      Family History  Problem Relation Age of Onset  . Cancer Mother     breast  . Cancer Father     prostate    History  Substance Use Topics  . Smoking status: Former Smoker -- 0.50 packs/day    Quit date: 09/06/2010  . Smokeless tobacco: Not on file  . Alcohol Use: No      Review of Systems  All other systems reviewed and are negative.    Allergies  Penicillins  Home Medications   Current Outpatient Rx  Name  Route  Sig  Dispense  Refill  . ALPRAZolam (XANAX) 0.5 MG tablet   Oral   Take 0.5 mg by mouth Once daily as needed. For anxiety.         . fluticasone (FLONASE) 50 MCG/ACT nasal spray   Nasal   Place 2 sprays into the nose daily.   16 g   2   . lisinopril (PRINIVIL,ZESTRIL) 20 MG tablet      TAKE 1 TABLET TWICE A DAY   180 tablet   0     Patient must have office visit for additional refi ...   . lisinopril (PRINIVIL,ZESTRIL) 20 MG tablet   Oral   Take 1 tablet (20 mg total) by mouth 2 (two) times daily.   180 tablet   1     Patient is due to see Cardiologist ASAP for furthe ...   . Nebivolol HCl (BYSTOLIC) 20 MG TABS   Oral   Take 1 tablet (20 mg total) by mouth  daily.   180 tablet   1     Patient is due to see Cardiologist ASAP for furthe ...   . oxyCODONE-acetaminophen (PERCOCET/ROXICET) 5-325 MG per tablet   Oral   Take 1 tablet by mouth every 4 (four) hours as needed for pain.   6 tablet   0   . propranolol (INDERAL) 10 MG tablet   Oral   Take 10 mg by mouth daily as needed. For high blood pressure.           BP 152/79  Pulse 72  Temp(Src) 98 F (36.7 C) (Oral)  Ht 6\' 2"  (1.88 m)  Wt 261 lb (118.389 kg)  BMI 33.5 kg/m2  SpO2 97%  Physical Exam  Constitutional: He appears well-developed and well-nourished.  HENT:  Head: Normocephalic and atraumatic.  Right Ear: External ear normal.  Left Ear: External ear normal.  Mouth/Throat: No oropharyngeal exudate.  +nasal erythema, rhinorrhea bilaterally, + post oropharyngeal erythema    Eyes: Conjunctivae are normal. Pupils are equal, round, and  reactive to light.  Neck: Normal range of motion. Neck supple.  Cardiovascular: Normal rate, regular rhythm and normal heart sounds.   Pulmonary/Chest: Effort normal and breath sounds normal.  Abdominal: Soft.  Musculoskeletal: Normal range of motion.  Lymphadenopathy:    He has cervical adenopathy.  Neurological: He is alert.  Skin: Skin is warm.    ED Course  Procedures (including critical care time)  Labs Reviewed  POCT RAPID STREP A (OFFICE)   No results found.   1. URI (upper respiratory infection)       MDM  Likely viral URI.  Rapid strep negative. Centor score 2. Will culture.  Discussed supportive care and infectious/resp/ENT red flags.  Follow up as needed.     The patient and/or caregiver has been counseled thoroughly with regard to treatment plan and/or medications prescribed including dosage, schedule, interactions, rationale for use, and possible side effects and they verbalize understanding. Diagnoses and expected course of recovery discussed and will return if not improved as expected or if the condition  worsens. Patient and/or caregiver verbalized understanding.             Doree Albee, MD 04/25/12 [phone]

## 2012-04-25 NOTE — ED Notes (Signed)
Paul Freeman c/o sore throat, body aches and fatigue x yesterday.

## 2012-04-27 ENCOUNTER — Telehealth: Payer: Self-pay | Admitting: Emergency Medicine

## 2012-05-29 ENCOUNTER — Emergency Department (HOSPITAL_BASED_OUTPATIENT_CLINIC_OR_DEPARTMENT_OTHER)
Admission: EM | Admit: 2012-05-29 | Discharge: 2012-05-29 | Disposition: A | Discharge status: Home / Self Care | Payer: BC Managed Care – PPO | Attending: Emergency Medicine | Admitting: Emergency Medicine

## 2012-05-29 ENCOUNTER — Emergency Department (HOSPITAL_BASED_OUTPATIENT_CLINIC_OR_DEPARTMENT_OTHER): Payer: BC Managed Care – PPO

## 2012-05-29 ENCOUNTER — Encounter (HOSPITAL_BASED_OUTPATIENT_CLINIC_OR_DEPARTMENT_OTHER): Payer: Self-pay | Admitting: Emergency Medicine

## 2012-05-29 DIAGNOSIS — F3289 Other specified depressive episodes: Secondary | ICD-10-CM | POA: Insufficient documentation

## 2012-05-29 DIAGNOSIS — Z87891 Personal history of nicotine dependence: Secondary | ICD-10-CM | POA: Insufficient documentation

## 2012-05-29 DIAGNOSIS — Z79899 Other long term (current) drug therapy: Secondary | ICD-10-CM | POA: Insufficient documentation

## 2012-05-29 DIAGNOSIS — F329 Major depressive disorder, single episode, unspecified: Secondary | ICD-10-CM | POA: Insufficient documentation

## 2012-05-29 DIAGNOSIS — M546 Pain in thoracic spine: Secondary | ICD-10-CM

## 2012-05-29 DIAGNOSIS — F411 Generalized anxiety disorder: Secondary | ICD-10-CM | POA: Insufficient documentation

## 2012-05-29 DIAGNOSIS — I10 Essential (primary) hypertension: Secondary | ICD-10-CM | POA: Insufficient documentation

## 2012-05-29 DIAGNOSIS — Z880 Allergy status to penicillin: Secondary | ICD-10-CM | POA: Insufficient documentation

## 2012-05-29 DIAGNOSIS — R0789 Other chest pain: Secondary | ICD-10-CM

## 2012-05-29 DIAGNOSIS — Z8701 Personal history of pneumonia (recurrent): Secondary | ICD-10-CM | POA: Insufficient documentation

## 2012-05-29 LAB — BASIC METABOLIC PANEL
BUN: 15 mg/dL (ref 6–23)
Calcium: 10 mg/dL (ref 8.4–10.5)
Creatinine, Ser: 1 mg/dL (ref 0.50–1.35)
GFR calc Af Amer: >90 mL/min (ref >90)
GFR calc non Af Amer: >90 mL/min (ref >90)
Glucose, Bld: 109 mg/dL — ABNORMAL HIGH (ref 70–99)

## 2012-05-29 LAB — CBC
Hemoglobin: 15.1 g/dL (ref 13.0–17.0)
MCH: 30.1 pg (ref 26.0–34.0)
MCHC: 34.8 g/dL (ref 30.0–36.0)
Platelets: 181 10*3/uL (ref 150–400)
RDW: 12.1 % (ref 11.5–15.5)

## 2012-05-29 LAB — URINALYSIS, ROUTINE W REFLEX MICROSCOPIC
Glucose, UA: NEGATIVE mg/dL
Ketones, ur: NEGATIVE mg/dL
Leukocytes, UA: NEGATIVE
Protein, ur: NEGATIVE mg/dL
Urobilinogen, UA: 0.2 mg/dL (ref 0.0–1.0)

## 2012-05-29 NOTE — ED Notes (Signed)
Pt c/o of left flank pain that radiates to left lower back, denies cp, sob, n/v, does report difficulty urinating at times and is followed by urologist

## 2012-05-29 NOTE — ED Notes (Signed)
EKG done and shown to Dr. Manus Gunning. An old EKG was pulled from the [facility] system and also given to the doctor.

## 2012-05-29 NOTE — ED Notes (Signed)
Pt c/o pain under left side of rib in axillary area, radiating to left shoulder and scapula area x 3 weeks.

## 2012-05-29 NOTE — ED Provider Notes (Signed)
History     CSN: [id number]  Arrival date & time 05/29/12  2026   First MD Initiated Contact with Patient 05/29/12 2135      Chief Complaint  Patient presents with  . Back Pain    (Consider location/radiation/quality/duration/timing/severity/associated sxs/prior treatment) HPI  Patient presents with chest and back discomfort on the left side that has been going on for at least 3 weeks, but he is unsure exactly how long, says it could be up to 3 months. He says during the day time it is in his left chest over his lower ribs on the side.  He says at night time it seems to move to his L back and shoulder.  He says switching positions can help.  He denies dyspnea, cough, fever, exertional changes of the discomfort, palpitations, LE edema and calf pain.  Past Medical History  Diagnosis Date  . Hypertension   . Anxiety   . Depression   . Pneumonia     Past Surgical History  Procedure Laterality Date  . Appendectomy      Family History  Problem Relation Age of Onset  . Cancer Mother     breast  . Cancer Father     prostate    History  Substance Use Topics  . Smoking status: Former Smoker -- 0.50 packs/day    Quit date: 09/06/2010  . Smokeless tobacco: Not on file  . Alcohol Use: No      Review of Systems  Allergies  Penicillins  Home Medications   Current Outpatient Rx  Name  Route  Sig  Dispense  Refill  . ALPRAZolam (XANAX) 0.5 MG tablet   Oral   Take 0.5 mg by mouth Once daily as needed. For anxiety.         Marland Kitchen buPROPion (WELLBUTRIN XL) 300 MG 24 hr tablet   Oral   Take 300 mg by mouth daily.         Marland Kitchen lisinopril (PRINIVIL,ZESTRIL) 20 MG tablet   Oral   Take 1 tablet (20 mg total) by mouth 2 (two) times daily.   180 tablet   1     Patient is due to see Cardiologist ASAP for furthe ...   . Nebivolol HCl (BYSTOLIC) 20 MG TABS   Oral   Take 1 tablet (20 mg total) by mouth daily.   180 tablet   1     Patient is due to see Cardiologist ASAP  for furthe ...   . EXPIRED: fluticasone (FLONASE) 50 MCG/ACT nasal spray   Nasal   Place 2 sprays into the nose daily.   16 g   2   . lisinopril (PRINIVIL,ZESTRIL) 20 MG tablet      TAKE 1 TABLET TWICE A DAY   180 tablet   0     Patient must have office visit for additional refi ...   . oxyCODONE-acetaminophen (PERCOCET/ROXICET) 5-325 MG per tablet   Oral   Take 1 tablet by mouth every 4 (four) hours as needed for pain.   6 tablet   0   . propranolol (INDERAL) 10 MG tablet   Oral   Take 10 mg by mouth daily as needed. For high blood pressure.           BP 134/84  Pulse 85  Temp(Src) 98.4 F (36.9 C) (Oral)  Resp 18  Ht 6\' 2"  (1.88 m)  Wt 250 lb (113.399 kg)  BMI 32.08 kg/m2  SpO2 96%  Physical Exam General appearance:  alert, cooperative and no distress Neck: no adenopathy, no JVD and supple, symmetrical, trachea midline Lungs: clear to auscultation bilaterally Chest wall: no tenderness Heart: regular rate and rhythm, S1, S2 normal, no murmur, click, rub or gallop Abdomen: soft, non-tender; bowel sounds normal; no masses,  no organomegaly Extremities: extremities normal, atraumatic, no cyanosis or edema and Homans sign is negative, no sign of DVT Pulses: 2+ and symmetric ED Course  Procedures (including critical care time)  Labs Reviewed  BASIC METABOLIC PANEL - Abnormal; Notable for the following:    Glucose, Bld 109 (*)    All other components within normal limits  TROPONIN I  CBC  URINALYSIS, ROUTINE W REFLEX MICROSCOPIC  D-DIMER, QUANTITATIVE   Dg Chest 2 View  05/29/2012  *RADIOLOGY REPORT*  Clinical Data: Chest pain.  CHEST - 2 VIEW  Comparison: 11/18/2011 and 04/12/2004 chest radiographs  Findings: The cardiomediastinal silhouette is unremarkable. Mild peribronchial thickening is unchanged. There is no evidence of focal airspace disease, pulmonary edema, suspicious pulmonary nodule/mass, pleural effusion, or pneumothorax. No acute bony abnormalities  are identified. There has been no significant change since 2006.  IMPRESSION: No evidence of active cardiopulmonary disease.   Original Report Authenticated By: Harmon Pier, M.D.     Date: 05/29/2012  Rate: 72  Rhythm: normal sinus rhythm  QRS Axis: normal  Intervals: normal  ST/T Wave abnormalities: normal  Conduction Disutrbances:none  Narrative Interpretation: Normal ECG  Old EKG Reviewed: unchanged    1. Chest pain, non-cardiac   2. Back pain, thoracic       MDM  Discussed normal imaging, ECG and blood work with patient.  Advised f/u with PCP.         Ardyth Gal, MD 05/29/12 2241

## 2012-05-30 NOTE — ED Provider Notes (Signed)
I saw and evaluated the patient, reviewed the resident's note and I agree with the findings and plan. If applicable, I agree with the resident's interpretation of the EKG.  If applicable, I was present for critical portions of any procedures performed.   3 weeks-3 months of L chest pain, worse with palpation, worse with movement.  No SOB, cough, fever. PERC negative.  Glynn Octave, MD 05/30/12 [phone]

## 2012-08-10 ENCOUNTER — Emergency Department (HOSPITAL_BASED_OUTPATIENT_CLINIC_OR_DEPARTMENT_OTHER): Payer: BC Managed Care – PPO

## 2012-08-10 ENCOUNTER — Encounter (HOSPITAL_BASED_OUTPATIENT_CLINIC_OR_DEPARTMENT_OTHER): Payer: Self-pay | Admitting: *Deleted

## 2012-08-10 ENCOUNTER — Emergency Department (HOSPITAL_BASED_OUTPATIENT_CLINIC_OR_DEPARTMENT_OTHER)
Admission: EM | Admit: 2012-08-10 | Discharge: 2012-08-10 | Disposition: A | Discharge status: Home / Self Care | Payer: BC Managed Care – PPO | Attending: Emergency Medicine | Admitting: Emergency Medicine

## 2012-08-10 DIAGNOSIS — Z8701 Personal history of pneumonia (recurrent): Secondary | ICD-10-CM | POA: Insufficient documentation

## 2012-08-10 DIAGNOSIS — R071 Chest pain on breathing: Secondary | ICD-10-CM | POA: Insufficient documentation

## 2012-08-10 DIAGNOSIS — Z880 Allergy status to penicillin: Secondary | ICD-10-CM | POA: Insufficient documentation

## 2012-08-10 DIAGNOSIS — R0789 Other chest pain: Secondary | ICD-10-CM

## 2012-08-10 DIAGNOSIS — Z87891 Personal history of nicotine dependence: Secondary | ICD-10-CM | POA: Insufficient documentation

## 2012-08-10 DIAGNOSIS — R109 Unspecified abdominal pain: Secondary | ICD-10-CM | POA: Insufficient documentation

## 2012-08-10 DIAGNOSIS — F411 Generalized anxiety disorder: Secondary | ICD-10-CM | POA: Insufficient documentation

## 2012-08-10 DIAGNOSIS — F3289 Other specified depressive episodes: Secondary | ICD-10-CM | POA: Insufficient documentation

## 2012-08-10 DIAGNOSIS — I10 Essential (primary) hypertension: Secondary | ICD-10-CM | POA: Insufficient documentation

## 2012-08-10 DIAGNOSIS — F329 Major depressive disorder, single episode, unspecified: Secondary | ICD-10-CM | POA: Insufficient documentation

## 2012-08-10 DIAGNOSIS — Z79899 Other long term (current) drug therapy: Secondary | ICD-10-CM | POA: Insufficient documentation

## 2012-08-10 LAB — CBC WITH DIFFERENTIAL/PLATELET
Basophils Absolute: 0 10*3/uL (ref 0.0–0.1)
Basophils Relative: 0 % (ref 0–1)
Eosinophils Absolute: 0.1 10*3/uL (ref 0.0–0.7)
Hemoglobin: 14.8 g/dL (ref 13.0–17.0)
MCH: 30.7 pg (ref 26.0–34.0)
MCHC: 35.4 g/dL (ref 30.0–36.0)
Monocytes Absolute: 0.6 10*3/uL (ref 0.1–1.0)
Monocytes Relative: 8 % (ref 3–12)
Neutrophils Relative %: 56 % (ref 43–77)
RDW: 11.9 % (ref 11.5–15.5)

## 2012-08-10 LAB — COMPREHENSIVE METABOLIC PANEL
Albumin: 4.3 g/dL (ref 3.5–5.2)
BUN: 16 mg/dL (ref 6–23)
Creatinine, Ser: 1.1 mg/dL (ref 0.50–1.35)
Potassium: 3.7 mEq/L (ref 3.5–5.1)
Total Protein: 6.8 g/dL (ref 6.0–8.3)

## 2012-08-10 MED ORDER — IOHEXOL 350 MG/ML SOLN
80.0000 mL | Freq: Once | INTRAVENOUS | Status: AC | PRN
  Administered 2012-08-10: 80 mL via INTRAVENOUS

## 2012-08-10 NOTE — ED Notes (Signed)
Left upper quadrant pain for a while per patient. Was here a few months ago with same and cause of pain was never found. Patient states he is worried because he does not have an answer to the cause of the pain.

## 2012-08-10 NOTE — ED Provider Notes (Signed)
History    CSN: [id number] Arrival date & time 08/10/12  1924  First MD Initiated Contact with Patient 08/10/12 1938     Chief Complaint  Patient presents with  . Abdominal Pain   (Consider location/radiation/quality/duration/timing/severity/associated sxs/prior Treatment) HPI Comments: Patient presents with a four month history of intermittent pain in the left side of his chest.  The pain is located between two ribs and is worse when he pushes on the area or breathes deep.  He denies any injury or trauma.  The pain comes and goes and seems to be more prominent when he is stressed.  He was seen here two months ago and has xrays and labs but nothing turned up.  He returns because the symptoms continue and have been much worse the past two days.  Patient is a 37 y.o. male presenting with chest pain. The history is provided by the patient.  Chest Pain Pain location:  L chest Pain quality: sharp   Pain radiates to:  Does not radiate Pain radiates to the back: no   Pain severity:  Moderate Onset quality:  Gradual Duration:  4 months Timing:  Intermittent Progression:  Worsening Chronicity:  New Context: not breathing and no movement   Relieved by:  Nothing Exacerbated by: palpation, deep breath. Ineffective treatments:  None tried Associated symptoms: no abdominal pain, no fever and no shortness of breath    Past Medical History  Diagnosis Date  . Hypertension   . Anxiety   . Depression   . Pneumonia    Past Surgical History  Procedure Laterality Date  . Appendectomy     Family History  Problem Relation Age of Onset  . Cancer Mother     breast  . Cancer Father     prostate   History  Substance Use Topics  . Smoking status: Former Smoker -- 0.50 packs/day    Quit date: 09/06/2010  . Smokeless tobacco: Not on file  . Alcohol Use: No    Review of Systems  Constitutional: Negative for fever.  Respiratory: Negative for shortness of breath.   Cardiovascular:  Positive for chest pain.  Gastrointestinal: Negative for abdominal pain.  All other systems reviewed and are negative.    Allergies  Penicillins  Home Medications   Current Outpatient Rx  Name  Route  Sig  Dispense  Refill  . ALPRAZolam (XANAX) 0.5 MG tablet   Oral   Take 0.5 mg by mouth Once daily as needed. For anxiety.         Marland Kitchen buPROPion (WELLBUTRIN XL) 300 MG 24 hr tablet   Oral   Take 300 mg by mouth daily.         Marland Kitchen EXPIRED: fluticasone (FLONASE) 50 MCG/ACT nasal spray   Nasal   Place 2 sprays into the nose daily.   16 g   2   . lisinopril (PRINIVIL,ZESTRIL) 20 MG tablet      TAKE 1 TABLET TWICE A DAY   180 tablet   0     Patient must have office visit for additional refi ...   . lisinopril (PRINIVIL,ZESTRIL) 20 MG tablet   Oral   Take 1 tablet (20 mg total) by mouth 2 (two) times daily.   180 tablet   1     Patient is due to see Cardiologist ASAP for furthe ...   . Nebivolol HCl (BYSTOLIC) 20 MG TABS   Oral   Take 1 tablet (20 mg total) by mouth daily.  180 tablet   1     Patient is due to see Cardiologist ASAP for furthe ...   . oxyCODONE-acetaminophen (PERCOCET/ROXICET) 5-325 MG per tablet   Oral   Take 1 tablet by mouth every 4 (four) hours as needed for pain.   6 tablet   0   . propranolol (INDERAL) 10 MG tablet   Oral   Take 10 mg by mouth daily as needed. For high blood pressure.          BP 148/86  Pulse 79  Temp(Src) 98.3 F (36.8 C) (Oral)  Resp 20  Ht 6\' 1"  (1.854 m)  Wt 250 lb (113.399 kg)  BMI 32.99 kg/m2  SpO2 98% Physical Exam  Nursing note and vitals reviewed. Constitutional: He is oriented to person, place, and time. He appears well-developed and well-nourished. No distress.  HENT:  Head: Normocephalic and atraumatic.  Mouth/Throat: Oropharynx is clear and moist.  Neck: Normal range of motion. Neck supple.  Cardiovascular: Normal rate, regular rhythm and normal heart sounds.   No murmur  heard. Pulmonary/Chest: Breath sounds normal. No respiratory distress. He has no wheezes.  There is ttp in the left lateral lower ribs.  I am unable to palpate any abnormality and lungs are clear and equal.  Abdominal: Soft. Bowel sounds are normal. He exhibits no distension. There is no tenderness.  Musculoskeletal: Normal range of motion. He exhibits no edema.  Lymphadenopathy:    He has no cervical adenopathy.  Neurological: He is alert and oriented to person, place, and time.  Skin: Skin is warm and dry. He is not diaphoretic.    ED Course  Procedures (including critical care time) Labs Reviewed  CBC WITH DIFFERENTIAL  COMPREHENSIVE METABOLIC PANEL   No results found. No diagnosis found.  MDM  The patient presents here with pain in the left posterior chest wall that seems musculoskeletal in nature.  It has lingered from quite some time and he is concerned there is something bad.  The ct and labs are unremarkable revealing no pathology.  He will discharged with instructions to take nsaids, rest, and follow up prn.  Geoffery Lyons, MD 08/10/12 [phone]

## 2013-10-18 ENCOUNTER — Encounter: Payer: Self-pay | Admitting: Cardiovascular Disease

## 2016-03-10 ENCOUNTER — Institutional Professional Consult (permissible substitution): Payer: Self-pay | Admitting: Cardiovascular Disease

## 2016-04-15 ENCOUNTER — Other Ambulatory Visit: Payer: Self-pay | Admitting: Internal Medicine

## 2016-04-15 DIAGNOSIS — Z8249 Family history of ischemic heart disease and other diseases of the circulatory system: Secondary | ICD-10-CM

## 2016-04-22 ENCOUNTER — Other Ambulatory Visit: Payer: Self-pay

## 2016-05-02 ENCOUNTER — Other Ambulatory Visit: Payer: Self-pay

## 2016-05-03 ENCOUNTER — Institutional Professional Consult (permissible substitution): Payer: Self-pay | Admitting: Cardiovascular Disease

## 2016-05-10 ENCOUNTER — Ambulatory Visit
Admission: RE | Admit: 2016-05-10 | Discharge: 2016-05-10 | Disposition: A | Discharge status: Home / Self Care | Payer: No Typology Code available for payment source | Source: Ambulatory Visit | Attending: Internal Medicine | Admitting: Internal Medicine

## 2016-05-10 DIAGNOSIS — Z8249 Family history of ischemic heart disease and other diseases of the circulatory system: Secondary | ICD-10-CM

## 2017-02-13 DIAGNOSIS — I2584 Coronary atherosclerosis due to calcified coronary lesion: Secondary | ICD-10-CM | POA: Diagnosis not present

## 2017-02-13 DIAGNOSIS — I10 Essential (primary) hypertension: Secondary | ICD-10-CM | POA: Diagnosis not present

## 2017-02-13 DIAGNOSIS — I2510 Atherosclerotic heart disease of native coronary artery without angina pectoris: Secondary | ICD-10-CM | POA: Diagnosis not present

## 2017-02-13 DIAGNOSIS — E785 Hyperlipidemia, unspecified: Secondary | ICD-10-CM | POA: Diagnosis not present

## 2017-02-24 DIAGNOSIS — E785 Hyperlipidemia, unspecified: Secondary | ICD-10-CM | POA: Diagnosis not present

## 2017-02-24 DIAGNOSIS — I10 Essential (primary) hypertension: Secondary | ICD-10-CM | POA: Diagnosis not present

## 2017-02-24 DIAGNOSIS — I2510 Atherosclerotic heart disease of native coronary artery without angina pectoris: Secondary | ICD-10-CM | POA: Diagnosis not present

## 2017-02-28 DIAGNOSIS — I10 Essential (primary) hypertension: Secondary | ICD-10-CM | POA: Diagnosis not present

## 2017-02-28 DIAGNOSIS — I2510 Atherosclerotic heart disease of native coronary artery without angina pectoris: Secondary | ICD-10-CM | POA: Diagnosis not present

## 2017-03-09 DIAGNOSIS — I10 Essential (primary) hypertension: Secondary | ICD-10-CM | POA: Diagnosis not present

## 2017-03-09 DIAGNOSIS — I2584 Coronary atherosclerosis due to calcified coronary lesion: Secondary | ICD-10-CM | POA: Diagnosis not present

## 2017-03-09 DIAGNOSIS — E785 Hyperlipidemia, unspecified: Secondary | ICD-10-CM | POA: Diagnosis not present

## 2017-03-09 DIAGNOSIS — I2510 Atherosclerotic heart disease of native coronary artery without angina pectoris: Secondary | ICD-10-CM | POA: Diagnosis not present

## 2017-04-24 DIAGNOSIS — Z0000 Encounter for general adult medical examination without abnormal findings: Secondary | ICD-10-CM | POA: Diagnosis not present

## 2017-04-24 DIAGNOSIS — I10 Essential (primary) hypertension: Secondary | ICD-10-CM | POA: Diagnosis not present

## 2017-04-24 DIAGNOSIS — E039 Hypothyroidism, unspecified: Secondary | ICD-10-CM | POA: Diagnosis not present

## 2017-05-18 DIAGNOSIS — Z0001 Encounter for general adult medical examination with abnormal findings: Secondary | ICD-10-CM | POA: Diagnosis not present

## 2017-06-08 DIAGNOSIS — I2510 Atherosclerotic heart disease of native coronary artery without angina pectoris: Secondary | ICD-10-CM | POA: Diagnosis not present

## 2017-06-08 DIAGNOSIS — J029 Acute pharyngitis, unspecified: Secondary | ICD-10-CM | POA: Diagnosis not present

## 2017-06-08 DIAGNOSIS — E785 Hyperlipidemia, unspecified: Secondary | ICD-10-CM | POA: Diagnosis not present

## 2017-06-09 DIAGNOSIS — J029 Acute pharyngitis, unspecified: Secondary | ICD-10-CM | POA: Diagnosis not present

## 2017-06-12 DIAGNOSIS — R5381 Other malaise: Secondary | ICD-10-CM | POA: Diagnosis not present

## 2017-06-12 DIAGNOSIS — I10 Essential (primary) hypertension: Secondary | ICD-10-CM | POA: Diagnosis not present

## 2017-06-12 DIAGNOSIS — R5383 Other fatigue: Secondary | ICD-10-CM | POA: Diagnosis not present

## 2017-06-12 DIAGNOSIS — E785 Hyperlipidemia, unspecified: Secondary | ICD-10-CM | POA: Diagnosis not present

## 2017-06-23 DIAGNOSIS — I10 Essential (primary) hypertension: Secondary | ICD-10-CM | POA: Insufficient documentation

## 2017-06-23 DIAGNOSIS — H43392 Other vitreous opacities, left eye: Secondary | ICD-10-CM | POA: Diagnosis not present

## 2017-07-14 ENCOUNTER — Emergency Department (HOSPITAL_BASED_OUTPATIENT_CLINIC_OR_DEPARTMENT_OTHER)
Admission: EM | Admit: 2017-07-14 | Discharge: 2017-07-14 | Disposition: A | Discharge status: Home / Self Care | Payer: BLUE CROSS/BLUE SHIELD | Attending: Emergency Medicine | Admitting: Emergency Medicine

## 2017-07-14 ENCOUNTER — Other Ambulatory Visit: Payer: Self-pay

## 2017-07-14 ENCOUNTER — Encounter (HOSPITAL_BASED_OUTPATIENT_CLINIC_OR_DEPARTMENT_OTHER): Payer: Self-pay

## 2017-07-14 ENCOUNTER — Emergency Department (HOSPITAL_BASED_OUTPATIENT_CLINIC_OR_DEPARTMENT_OTHER): Payer: BLUE CROSS/BLUE SHIELD

## 2017-07-14 DIAGNOSIS — Z79899 Other long term (current) drug therapy: Secondary | ICD-10-CM | POA: Insufficient documentation

## 2017-07-14 DIAGNOSIS — R0602 Shortness of breath: Secondary | ICD-10-CM | POA: Diagnosis not present

## 2017-07-14 DIAGNOSIS — R0789 Other chest pain: Secondary | ICD-10-CM | POA: Diagnosis not present

## 2017-07-14 DIAGNOSIS — Z87891 Personal history of nicotine dependence: Secondary | ICD-10-CM | POA: Insufficient documentation

## 2017-07-14 DIAGNOSIS — I10 Essential (primary) hypertension: Secondary | ICD-10-CM | POA: Diagnosis not present

## 2017-07-14 DIAGNOSIS — R079 Chest pain, unspecified: Secondary | ICD-10-CM | POA: Diagnosis not present

## 2017-07-14 LAB — BASIC METABOLIC PANEL
ANION GAP: 9 (ref 5–15)
BUN: 15 mg/dL (ref 6–20)
CHLORIDE: 103 mmol/L (ref 101–111)
CO2: 28 mmol/L (ref 22–32)
Calcium: 9.3 mg/dL (ref 8.9–10.3)
Creatinine, Ser: 0.92 mg/dL (ref 0.61–1.24)
GFR calc Af Amer: >60 mL/min (ref >60)
GFR calc non Af Amer: >60 mL/min (ref >60)
Glucose, Bld: 115 mg/dL — ABNORMAL HIGH (ref 65–99)
POTASSIUM: 3.2 mmol/L — AB (ref 3.5–5.1)
SODIUM: 140 mmol/L (ref 135–145)

## 2017-07-14 LAB — CBC WITH DIFFERENTIAL/PLATELET
Basophils Absolute: 0 10*3/uL (ref 0.0–0.1)
Basophils Relative: 0 %
EOS ABS: 0.1 10*3/uL (ref 0.0–0.7)
Eosinophils Relative: 1 %
HCT: 40.3 % (ref 39.0–52.0)
HEMOGLOBIN: 14 g/dL (ref 13.0–17.0)
LYMPHS ABS: 2.8 10*3/uL (ref 0.7–4.0)
LYMPHS PCT: 31 %
MCH: 30.6 pg (ref 26.0–34.0)
MCHC: 34.7 g/dL (ref 30.0–36.0)
MCV: 88.2 fL (ref 78.0–100.0)
Monocytes Absolute: 0.6 10*3/uL (ref 0.1–1.0)
Monocytes Relative: 7 %
NEUTROS PCT: 61 %
Neutro Abs: 5.7 10*3/uL (ref 1.7–7.7)
Platelets: 164 10*3/uL (ref 150–400)
RBC: 4.57 MIL/uL (ref 4.22–5.81)
RDW: 12.3 % (ref 11.5–15.5)
WBC: 9.2 10*3/uL (ref 4.0–10.5)

## 2017-07-14 LAB — D-DIMER, QUANTITATIVE: D-Dimer, Quant: <0.27 ug/mL-FEU (ref 0.00–0.50)

## 2017-07-14 LAB — TROPONIN I: Troponin I: <0.03 ng/mL (ref <0.03)

## 2017-07-14 MED ORDER — POTASSIUM CHLORIDE CRYS ER 20 MEQ PO TBCR
40.0000 meq | EXTENDED_RELEASE_TABLET | Freq: Once | ORAL | Status: AC
  Administered 2017-07-14: 40 meq via ORAL
  Filled 2017-07-14: qty 2

## 2017-07-14 MED ORDER — ASPIRIN 81 MG PO CHEW
162.0000 mg | CHEWABLE_TABLET | Freq: Once | ORAL | Status: AC
  Administered 2017-07-14: 162 mg via ORAL
  Filled 2017-07-14: qty 2

## 2017-07-14 NOTE — ED Notes (Signed)
Patient transported to X-ray 

## 2017-07-14 NOTE — ED Notes (Signed)
At registration: pt alert, NAD, calm, interactive, resps e/u, speaking in clear complete sentences, no dyspnea noted, steady gait to b/r, urine sample up given.

## 2017-07-14 NOTE — ED Triage Notes (Signed)
C/o SOB yesterday then better-pain to left axilla and left arm started yesterday was better and started back approx 11am-NAD-steady gait

## 2017-07-14 NOTE — ED Notes (Signed)
Pt verbalizes understanding of d/c instructions and denies any further needs at this time. 

## 2017-07-14 NOTE — ED Provider Notes (Signed)
[facility] EMERGENCY DEPARTMENT Provider Note   CSN: [id number] Arrival date & time: 07/14/17  1946     History   Chief Complaint Chief Complaint  Patient presents with  . Shortness of Breath    HPI Paul Freeman is a 42 y.o. male.  HPI   42 year old male with history of anxiety, hypertension, depression, pneumonia who presents the emergency department today to be evaluated for chest pain and shortness of breath that began yesterday.  Patient reports chest pain is to the left side of his chest.  Radiates into the left axilla down the left arm and into the left upper part of the back.  feels like a throbbing pain. States that pain is mild currently and rates it 2/10.  It is constant in nature. Pain is not worse with movement or palpation.  Is not worse with exertion.  He also reports that he feels short of breath and chest pain is worse with inspiration.  States he has a history of chronic headaches, sweating and nausea and his symptoms are at baseline right now.  No nausea.  No vomiting.  No abdominal pain or flank pain.  No vision changes, lightheadedness or dizziness.  No recent fevers.  No URI symptoms. Denies leg pain/swelling, hemoptysis, recent surgery/trauma, recent long travel, hormone use, personal hx of cancer, or hx of DVT/PE.   Denies current tobacco use, states that he smoked from the age of 70 to 45 years old.  Quit 6 years ago.  Of note, patient states that he sees Dr. Einar Gip, cardiologist, and had a stress echo 2 months ago which was negative.   Past Medical History:  Diagnosis Date  . Anxiety   . Depression   . Hypertension   . Pneumonia     Patient Active Problem List   Diagnosis Date Noted  . Sleep apnea 04/05/2011  . Chest pain 04/05/2011  . Hypertension   . Anxiety   . Depression     Past Surgical History:  Procedure Laterality Date  . APPENDECTOMY          Home Medications    Prior to Admission medications   Medication Sig  Start Date End Date Taking? Authorizing Provider  ALPRAZolam Duanne Moron) 0.5 MG tablet Take 0.5 mg by mouth Once daily as needed. For anxiety.    [provider]  buPROPion (WELLBUTRIN XL) 300 MG 24 hr tablet Take 300 mg by mouth daily.    [provider]  fluticasone (FLONASE) 50 MCG/ACT nasal spray Place 2 sprays into the nose daily. 05/05/11 05/04/12  Glendell Docker, NP  lisinopril (PRINIVIL,ZESTRIL) 20 MG tablet TAKE 1 TABLET TWICE A DAY 12/10/11   Nahser, Wonda Cheng, MD  lisinopril (PRINIVIL,ZESTRIL) 20 MG tablet Take 1 tablet (20 mg total) by mouth 2 (two) times daily. 04/06/12   Nahser, Wonda Cheng, MD  Nebivolol HCl (BYSTOLIC) 20 MG TABS Take 1 tablet (20 mg total) by mouth daily. 04/06/12   Nahser, Wonda Cheng, MD  oxyCODONE-acetaminophen (PERCOCET/ROXICET) 5-325 MG per tablet Take 1 tablet by mouth every 4 (four) hours as needed for pain. 11/16/11   Pattricia Boss, MD  propranolol (INDERAL) 10 MG tablet Take 10 mg by mouth daily as needed. For high blood pressure.    [provider]    Family History Family History  Problem Relation Age of Onset  . Cancer Mother        breast  . Cancer Father        prostate  Social History Social History   Tobacco Use  . Smoking status: Former Smoker    Packs/day: 0.50    Last attempt to quit: 09/06/2010    Years since quitting: 6.8  . Smokeless tobacco: Never Used  Substance Use Topics  . Alcohol use: Yes    Comment: occ  . Drug use: No     Allergies   Penicillins   Review of Systems Review of Systems  Constitutional: Negative for fever.  HENT: Negative for congestion, ear pain and sore throat.   Eyes: Negative for visual disturbance.  Respiratory: Positive for shortness of breath. Negative for cough and wheezing.   Cardiovascular: Positive for chest pain. Negative for palpitations and leg swelling.  Gastrointestinal: Positive for nausea (chronic). Negative for abdominal pain, blood in stool, constipation,  diarrhea and vomiting.  Genitourinary: Negative for flank pain.  Musculoskeletal: Positive for back pain. Negative for neck pain.  Skin: Negative for wound.  Neurological: Positive for headaches (chronic, unchanged). Negative for dizziness, weakness, light-headedness and numbness.    Physical Exam Updated Vital Signs BP (!) 124/91 (BP Location: Right Arm)   Pulse 75   Temp 97.8 F (36.6 C) (Oral)   Resp 16   Ht 6\' 2"  (1.88 m)   Wt 120.5 kg (265 lb 10.5 oz)   SpO2 96%   BMI 34.11 kg/m   Physical Exam  Constitutional: He appears well-developed and well-nourished.  Non-toxic appearance. He does not appear ill. No distress.  HENT:  Head: Normocephalic and atraumatic.  Mouth/Throat: Oropharynx is clear and moist.  Eyes: Pupils are equal, round, and reactive to light. Conjunctivae and EOM are normal.  Neck: Neck supple.  Cardiovascular: Normal rate, regular rhythm, normal heart sounds and intact distal pulses.  No murmur heard. Pulmonary/Chest: Effort normal and breath sounds normal. No respiratory distress. He has no decreased breath sounds. He has no wheezes. He has no rhonchi. He has no rales.  Abdominal: Soft. Bowel sounds are normal. He exhibits no distension and no mass. There is no tenderness. There is no guarding.  Musculoskeletal: He exhibits no edema.       Right lower leg: Normal. He exhibits no tenderness and no edema.       Left lower leg: Normal. He exhibits no tenderness and no edema.  No sniffing tenderness to the chest wall, left upper back or left axilla.  Neurological: He is alert.  Skin: Skin is warm and dry. Capillary refill takes less than 2 seconds.  Psychiatric: He has a normal mood and affect.  Nursing note and vitals reviewed.  ED Treatments / Results  Labs (all labs ordered are listed, but only abnormal results are displayed) Labs Reviewed  BASIC METABOLIC PANEL - Abnormal; Notable for the following components:      Result Value   Potassium 3.2 (*)      Glucose, Bld 115 (*)    All other components within normal limits  CBC WITH DIFFERENTIAL/PLATELET  TROPONIN I  D-DIMER, QUANTITATIVE (NOT AT [facility])    EKG EKG Interpretation  Date/Time:  Friday July 14 2017 20:01:35 EDT Ventricular Rate:  72 PR Interval:    QRS Duration: 117 QT Interval:  399 QTC Calculation: 437 R Axis:   72 Text Interpretation:  Sinus rhythm Nonspecific intraventricular conduction delay Baseline wander in lead(s) V1 V2 V3 similar to prior 4/14 Confirmed by Aletta Edouard [phone]) on 07/14/2017 8:34:58 PM   Radiology Dg Chest 2 View  Result Date: 07/14/2017 CLINICAL DATA:  Upper left chest pain  for 24 hours EXAM: CHEST - 2 VIEW COMPARISON:  May 29, 2012 FINDINGS: The heart size and mediastinal contours are within normal limits. Both lungs are clear. The visualized skeletal structures are unremarkable. IMPRESSION: No active cardiopulmonary disease. Electronically Signed   By: Abelardo Diesel M.D.   On: 07/14/2017 21:13    Procedures Procedures (including critical care time)  Medications Ordered in ED Medications  potassium chloride SA (K-DUR,KLOR-CON) CR tablet 40 mEq (40 mEq Oral Given 07/14/17 2124)  aspirin chewable tablet 162 mg (162 mg Oral Given 07/14/17 2227)     Initial Impression / Assessment and Plan / ED Course  I have reviewed the triage vital signs and the nursing notes.  Pertinent labs & imaging results that were available during my care of the patient were reviewed by me and considered in my medical decision making (see chart for details).    Case has been discussed with Dr. Melina Copa who agrees with the above plan.   Reevaluated patient and he states that he feels fine.  He denies any chest pain or shortness of breath.  Nursing ambulated the patient and he maintains sats at 94 to 97% while walking.  Final Clinical Impressions(s) / ED Diagnoses   Final diagnoses:  Atypical chest pain   Patient with mild 2/10 chest pain and intermittent  shortness of breath for the last 2 days.  No URI symptoms.  Normal vital signs.  Cardiopulmonary exam is within normal limits.  Chest x-ray negative for acute infiltrate, pneumothorax or widened mediastinum.  No evidence of pulmonary edema or cardiomegaly.  Doubt CHF.  Doubt dissection.  Patient is PERC negative, d-dimer is negative and doubt PE.  Doubt ACS.  Patient's EKG is unchanged.  His troponin is negative.  Patient advised to contact his cardiologist in regards to today's visit and to follow-up with PCP in 1 week.  Pt has been advised to return to the ED if CP becomes exertional, associated with diaphoresis or nausea, radiates to left jaw/arm, worsens or becomes concerning in any way. Pt appears reliable for follow up and is agreeable to discharge.   ED Discharge Orders    None       Bishop Dublin 07/14/17 2250    Hayden Rasmussen, MD 07/15/17 1240

## 2017-07-14 NOTE — Discharge Instructions (Signed)
Please follow-up with your cardiologist in regards to today's visit.  Follow-up with your primary care doctor in 1 week for reevaluation and return to the ER if you have any new or worsening symptoms.

## 2017-07-14 NOTE — ED Notes (Signed)
O2 while ambulating ranged 94%-97% HR 94

## 2017-09-20 DIAGNOSIS — J029 Acute pharyngitis, unspecified: Secondary | ICD-10-CM | POA: Diagnosis not present

## 2017-10-05 DIAGNOSIS — K1379 Other lesions of oral mucosa: Secondary | ICD-10-CM | POA: Diagnosis not present

## 2017-11-16 DIAGNOSIS — F411 Generalized anxiety disorder: Secondary | ICD-10-CM | POA: Diagnosis not present

## 2017-11-16 DIAGNOSIS — I10 Essential (primary) hypertension: Secondary | ICD-10-CM | POA: Diagnosis not present

## 2018-01-01 ENCOUNTER — Encounter: Payer: Self-pay | Admitting: Neurology

## 2018-01-03 ENCOUNTER — Encounter: Payer: Self-pay | Admitting: Neurology

## 2018-01-03 ENCOUNTER — Ambulatory Visit (INDEPENDENT_AMBULATORY_CARE_PROVIDER_SITE_OTHER): Payer: BLUE CROSS/BLUE SHIELD | Admitting: Neurology

## 2018-01-03 VITALS — BP 132/79 | HR 74 | Ht 74.0 in | Wt 267.0 lb

## 2018-01-03 DIAGNOSIS — G478 Other sleep disorders: Secondary | ICD-10-CM | POA: Diagnosis not present

## 2018-01-03 DIAGNOSIS — M791 Myalgia, unspecified site: Secondary | ICD-10-CM | POA: Insufficient documentation

## 2018-01-03 DIAGNOSIS — R5382 Chronic fatigue, unspecified: Secondary | ICD-10-CM | POA: Diagnosis not present

## 2018-01-03 DIAGNOSIS — G4733 Obstructive sleep apnea (adult) (pediatric): Secondary | ICD-10-CM

## 2018-01-03 DIAGNOSIS — R945 Abnormal results of liver function studies: Secondary | ICD-10-CM

## 2018-01-03 DIAGNOSIS — R7989 Other specified abnormal findings of blood chemistry: Secondary | ICD-10-CM

## 2018-01-03 DIAGNOSIS — R0683 Snoring: Secondary | ICD-10-CM

## 2018-01-03 DIAGNOSIS — M2619 Other specified anomalies of jaw-cranial base relationship: Secondary | ICD-10-CM

## 2018-01-03 DIAGNOSIS — G8929 Other chronic pain: Secondary | ICD-10-CM

## 2018-01-03 NOTE — Progress Notes (Addendum)
SLEEP MEDICINE CLINIC   Provider:  Larey Seat, MD   Primary Care Physician:  Deland Pretty, MD   Referring Provider: Adrian Prows, MD    Chief Complaint  Patient presents with  . Sleep Consult    Np for sleep consult. Alone. Rm 11. Patient mentioned that he has been dealing with disturbed sleep and fatigue for 25 years. Third sleep evaluation in 10 years - "exhausted 24/ 7, chronic pain 24/7, weak 24/7 ."    HPI:  Paul Freeman is a 42 y.o. male patient , seen on 01-03-2018  Upon referral from Dr. Einar Gip.  I have the pleasure of meeting today for the first time [name], originally from [place], [place]. 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   Paul Freeman reports that he cannot remember ever having restorative or refreshing sleep, he goes to bed he knows he sleeps but when he wakes up he is as fatigued and exhausted as he felt the evening before.   He had sleep 3 preceding sleep evaluations, the first one 12 -15 years ago in [place], [place], by [facility] Dr. Tonia Ghent. He was given a sleep study and told to use CPAP - and he couldn' t tolerate CPAP. Dr Shelia Media had seen him afterwards for a new evaluation, ONO was normal, ended in a referral to [facility]. He started on anxiety medications but he felt his sleep did not benefit. He also felt sore all the time.  He is not sure if he had another sleep test since. Dr Einar Gip tested him for fatigue - and found no evidence of any cardiac disease- normal Echo, EKG and he referred here.  He started him on cholesterol medication. He mentioned his generalized anxiety disorder, possible ADD, ADHD.  The patient was tested for attention deficit disorder by a psychologist and the results are negative..   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   He usually comes home for dinner at about 6 PM, he will spend the evening with his wife and family, he goes to the bedroom at 10 PM but he is not asleep before midnight, and he watches TV in his bedroom. His wife is equally unhappy about this habit but she has told him that he snores less than he used to.  He usually goes to sleep on his left side, he has one pillow that he embraces or cradles as he sleeps.  He wakes up at least once in the morning at 4 AM for bathroom break. He used not to dream for many years.  Rises at 6.45 with difficulties- his wife tries to wake him from before 6 AM on. He drives the children to school.  He is groggy and achy until 10 AM- its better after that.   Sleep Medical history:  OSA 12 years ago, couldn't tolerate CPAP- anxiety, ADD(?). Coronary calcification.  Detailed review of Dr. Irven Shelling notes.  Had elevated LFTs in 2005- " through the roof" north of 800 units.   Brother with MS, sees Dr.Sater, all paternal male 38 members had severe fatigue, achiness and soreness.- starting in their teens or twenties.  Mother from [place].  Social history: married, self employed, 2 children 90 year old daughter, 19 year old son, former smoker - quit 2013- ETOH , 1-2  drinks a month, caffeine - coffee 2-6 cups a day ( Espresso).   Review of Systems: Out of a complete 14 system review, the patient complains of only the following symptoms, and all other reviewed systems are negative. Snoring, fatigue- no naps.   Epworth Sleepiness Score : 9/ 24  , Fatigue severity score 41/ 63 points   , depression score- n/a    Social History   Socioeconomic History  . Marital status: Married    Spouse name: Not on file  . Number of children: Not on file  . Years of education: Not on file  . Highest education level: Not on file  Occupational History  . Not on file  Social Needs  . Financial resource strain: Not on file  . Food insecurity:    Worry: Not on file    Inability: Not on  file  . Transportation needs:    Medical: Not on file    Non-medical: Not on file  Tobacco Use  . Smoking status: Former Smoker    Packs/day: 0.50    Years: 24.00    Pack years: 12.00    Last attempt to quit: 2013    Years since quitting: 6.9  . Smokeless tobacco: Never Used  Substance and Sexual Activity  . Alcohol use: Yes    Comment: occ  . Drug use: No Quit smoking 2013   . Caffeine  Espresso - up to 8 a day!!  Lifestyle  . Physical activity:    Days per week:     Minutes per session:   . Stress:   Relationships  . Social connections:                                . Intimate partner violence:                   Forced sexual activity:   Other Topics Concern  . Not on file  Social History Narrative  . Not on file    Family History  Problem Relation Age of Onset  . Cancer Mother        breast  . Cancer Father        prostate  . Hypertension Father   . Atrial fibrillation Father   . Multiple sclerosis Brother     Past Medical History:  Diagnosis Date  . Anxiety   . Depression   . Hypertension   . Mild hyperlipidemia   . Pneumonia   . Sleep apnea     Past Surgical History:  Procedure Laterality Date  . APPENDECTOMY    . CYSTECTOMY  2008   cyst removal from left armpit    Current Outpatient Medications  Medication Sig Dispense Refill  . Ascorbic Acid (VITAMIN C) 1000 MG tablet Take 1,000 mg by mouth daily.    Marland Kitchen aspirin EC 81 MG tablet Take 81 mg by mouth daily.    Marland Kitchen buPROPion (WELLBUTRIN XL) 150 MG 24 hr tablet Take 3 tablets by mouth daily.  2  . butalbital-acetaminophen-caffeine (FIORICET, ESGIC) 50-325-40 MG tablet     . Coenzyme Q10 (CO Q-10) 100 MG CAPS Take by mouth.    . diazepam (VALIUM) 10 MG tablet TAKE 1/2 TO 1 TABLET BY MOUTH 2-3 TIMES DAILY AS NEEDED    . lisinopril-hydrochlorothiazide (PRINZIDE,ZESTORETIC) 20-12.5 MG tablet 1 tablet daily.    . methocarbamol (ROBAXIN) 500 MG tablet TAKE 1 TO 2 TABLETS TWICE A  DAY AS NEEDED     . metoprolol tartrate (LOPRESSOR) 100 MG tablet Take 100 mg by mouth 2 (two) times daily.     . Multiple Vitamin (MULTIVITAMIN) tablet Take 1 tablet by mouth daily.    . rosuvastatin (CRESTOR) 10 MG tablet TAKE 1 TABLET BY MOUTH EVERY DAY    . fluticasone (FLONASE) 50 MCG/ACT nasal spray Place 2 sprays into the nose daily. 16 g 2   No current facility-administered medications for this visit.     Allergies as of 01/03/2018 - Review Complete 01/03/2018  Allergen Reaction Noted  . Penicillins Other (See Comments) 06/02/2010    Vitals: BP 132/79   Pulse 74   Ht 6\' 2"  (1.88 m)   Wt 267 lb (121.1 kg)   BMI 34.28 kg/m  Last Weight:  Wt Readings from Last 1 Encounters:  01/03/18 267 lb (121.1 kg)   ZOX:WRUE mass index is 34.28 kg/m.     Last Height:   Ht Readings from Last 1 Encounters:  01/03/18 6\' 2"  (1.88 m)    Physical exam:  General: The patient is awake, alert and appears not in acute distress. The patient is well groomed. Head: Normocephalic, atraumatic. Neck is supple. Mallampati 2 elongated uvula. ,  neck circumference:18.25 . Nasal airflow congested ,Retrognathia is noted. .  Cardiovascular:  Regular rate and rhythm , without  murmurs or carotid bruit, and without distended neck veins. Respiratory: Lungs are clear to auscultation. Skin:  Without evidence of edema, or rash Trunk: BMI is 34.28. The patient's posture is stooped.   Neurologic exam : The patient is awake and alert, oriented to place and time.   Attention span & concentration ability appears normal.  Speech is fluent, without dysarthria, dysphonia or aphasia.  Mood and affect are aloof   Cranial nerves: Pupils are equal and briskly reactive to light.  Extraocular movements  in vertical and horizontal planes intact and without nystagmus. Visual fields by finger perimetry are intact. Hearing to finger rub intact. Facial sensation intact to fine touch.Facial motor strength is symmetric and tongue and uvula  move midline. Shoulder shrug was symmetrical.   Motor exam:   Normal tone, muscle bulk and symmetric strength in all extremities.  Sensory:  Fine touch, pinprick and vibration were tested in all extremities. Proprioception tested in the upper extremities was normal.  Coordination: Rapid alternating movements in the fingers/hands was normal. Finger-to-nose maneuver  normal without evidence of ataxia, dysmetria or tremor.  Gait and station: Patient walks without assistive device and is able unassisted to climb up to the exam table. Strength within normal limits.  Stance is stable and normal.  Toe and heel stand were tested . Turns with 3 Steps. Romberg testing is negative.  Deep tendon reflexes: in the  upper and lower extremities are symmetric and intact. Babinski maneuver response is  downgoing.    Assessment:  After physical and neurologic examination, review of laboratory studies,  Personal review of imaging studies, reports of other /same  Imaging studies, results of polysomnography and / or neurophysiology testing and pre-existing records as far as provided in visit., my assessment is   1) Favism, Thalassemia- SICILIAN ANCESTRY . I believe this causes his soreness, achiness and tendency to elevated LFTs.   2) I am happy to check for OSA- he has retrognathia - he has anxiety, and this interfered with CPAP compliance. He may well respond to a dental device !   3) Obesity- needs to change diet - high protein and  lower carb.   The patient was advised of the nature of the diagnosed disorder, the treatment options and the  risks for general health and wellness arising from not treating the condition.   I spent more than 50 minutes of face to face time with the patient.  Greater than 50% of time was spent in counseling and coordination of care. We have discussed the diagnosis and differential and I answered the patient's questions.    Plan:  Treatment plan and additional workup  :  Thalassemia panel, CK CKMB, sed rate , c-reactive protein, ANA and sleep study.    Rv after sleep test/   Paul Seat, MD 02/77/4128, 7:86 AM  Certified in Neurology by ABPN Certified in [place] by [facility] [address] [place], [place] [address]    Addendum 01-17-2018- still waiting for thalassemia panel!

## 2018-01-03 NOTE — Patient Instructions (Signed)
Hemolytic Anemia Anemia is a condition in which you do not have enough red blood cells to carry oxygen throughout your body. Hemolytic anemia occurs when your red blood cells are being destroyed faster than they are being produced. Hemolytic anemia can affect people of all ages. It may worsen existing heart or lung disease. There are many types of hemolytic anemia, and they can be divided into two different groups: inherited and acquired. Inherited hemolytic anemia is due to a gene that your parents passed on to you. The abnormal cells may break down while moving through your circulatory system. Your spleen may remove the abnormal blood cells and debris from your blood stream. Acquired hemolytic anemia occurs when your red blood cells are destroyed either by certain medicines that you have used or as a result of infections or diseases that you have. What are the causes? Hemolytic anemia is caused by red blood cell destruction. Sometimes the reasons for the destruction are not clear. Known causes include:  Inherited disorders, such as  thalassemias.  Use of certain medicines.  Blood infection (septicemia).  Exposure to toxic chemicals or excessive radiation.  Reactions to blood transfusions.  Certain immune disorders.  Artificial heart valves.  Enlarged spleens.  What are the signs or symptoms?  Pale skin, eyes, and fingernails.  Irregular or fast heartbeat.  Headaches.  Tiredness (fatigue) and weakness, muscle pain and aches.  Dizziness or fainting.  Shortness of breath.  Yellowing of the skin or eyes (jaundice).  Chest pain.  Cold hands and feet. How is this diagnosed? Your health care provider will do a physical exam and ask questions about your symptoms. Blood tests, urine tests, and taking bone marrow tissue (biopsies) may be done to help find the cause of your anemia. How is this treated? Treatment depends on the cause of your anemia. Treatment may  include:  Medicines.  Blood transfusions.  Plasmapheresis.  Blood and bone marrow stem cell transplant.  Surgery to remove the spleen.  Follow these instructions at home:  Only take over-the-counter or prescription medicines as directed by your health care provider. If you are given antibiotics, take them as directed. Finish them even if you start to feel better.  Take over-the-counter iron supplements as directed by your health care provider.  Decrease the chances of getting sick by: ? Washing your hands often. ? Staying away from people who are sick. ? Getting a flu shot and pneumonia shot if recommended by your health care provider.  Avoid certain kinds of foods that can expose you to bacteria, such as uncooked foods.  Keep all follow-up appointments with your health care provider. Contact a health care provider if:  You become dizzy or tired easily.  Your skin looks pale.  You feel your heart beating faster than normal.  You feel like your heart has skipped or stopped beats (irregular heartbeat). Get help right away if:  Your skin and eyes turn yellow.  You develop chest pain.  You become short of breath.  You faint.  You develop an uncontrolled cough. This information is not intended to replace advice given to you by your health care provider. Make sure you discuss any questions you have with your health care provider. Document Released: 01/24/2005 Document Revised: 07/02/2015 Document Reviewed: 06/13/2012 Elsevier Interactive Patient Education  2017 [employer].

## 2018-01-09 DIAGNOSIS — L600 Ingrowing nail: Secondary | ICD-10-CM | POA: Diagnosis not present

## 2018-01-18 ENCOUNTER — Telehealth: Payer: Self-pay | Admitting: *Deleted

## 2018-01-18 LAB — C-REACTIVE PROTEIN: CRP: 2 mg/L (ref 0–10)

## 2018-01-18 LAB — CK TOTAL AND CKMB (NOT AT ARMC)
CK-MB Index: <1 ng/mL (ref 0.0–10.4)
Total CK: 131 U/L (ref 24–204)

## 2018-01-18 LAB — ANA W/REFLEX IF POSITIVE: Anti Nuclear Antibody(ANA): NEGATIVE

## 2018-01-18 LAB — ALPHA-THALASSEMIA GENOTYPR

## 2018-01-18 LAB — SEDIMENTATION RATE: Sed Rate: 3 mm/hr (ref 0–15)

## 2018-01-18 NOTE — Telephone Encounter (Signed)
Per Dr Brett Fairy, spoke with patient and informed him that his labs that are results are normal. Advised him the Thalassemia lab is still pending. He asked to be called as soon as it is available, verbalized understanding, appreciation of call.

## 2018-01-24 ENCOUNTER — Other Ambulatory Visit: Payer: Self-pay | Admitting: Neurology

## 2018-01-24 DIAGNOSIS — M791 Myalgia, unspecified site: Secondary | ICD-10-CM

## 2018-01-24 DIAGNOSIS — G4733 Obstructive sleep apnea (adult) (pediatric): Secondary | ICD-10-CM

## 2018-01-24 DIAGNOSIS — R0683 Snoring: Secondary | ICD-10-CM

## 2018-01-24 DIAGNOSIS — R5382 Chronic fatigue, unspecified: Secondary | ICD-10-CM

## 2018-01-24 DIAGNOSIS — G8929 Other chronic pain: Secondary | ICD-10-CM

## 2018-01-24 NOTE — Telephone Encounter (Signed)
Called and advised the patient that the last lab work came back negative. Dr Brett Fairy is asking that the patient come in and have another lab draw to rule out. Pt verbalized understanding. Gave the pt the lab hours and advised the order was placed. Patient will come in Thursday he states to have it drawn.

## 2018-01-25 ENCOUNTER — Other Ambulatory Visit (INDEPENDENT_AMBULATORY_CARE_PROVIDER_SITE_OTHER): Payer: Self-pay

## 2018-01-25 ENCOUNTER — Ambulatory Visit (INDEPENDENT_AMBULATORY_CARE_PROVIDER_SITE_OTHER): Payer: BLUE CROSS/BLUE SHIELD | Admitting: Neurology

## 2018-01-25 DIAGNOSIS — G4733 Obstructive sleep apnea (adult) (pediatric): Secondary | ICD-10-CM | POA: Diagnosis not present

## 2018-01-25 DIAGNOSIS — R5382 Chronic fatigue, unspecified: Secondary | ICD-10-CM

## 2018-01-25 DIAGNOSIS — M2619 Other specified anomalies of jaw-cranial base relationship: Secondary | ICD-10-CM

## 2018-01-25 DIAGNOSIS — M791 Myalgia, unspecified site: Secondary | ICD-10-CM | POA: Diagnosis not present

## 2018-01-25 DIAGNOSIS — G8929 Other chronic pain: Secondary | ICD-10-CM

## 2018-01-25 DIAGNOSIS — G478 Other sleep disorders: Secondary | ICD-10-CM

## 2018-01-25 DIAGNOSIS — R0683 Snoring: Secondary | ICD-10-CM | POA: Diagnosis not present

## 2018-01-25 DIAGNOSIS — Z0289 Encounter for other administrative examinations: Secondary | ICD-10-CM

## 2018-01-26 NOTE — Procedures (Signed)
IENT'S NAME:  Paul Freeman, Paul Freeman DOB:      12-03-1975      MR#:    [id number]     DATE OF RECORDING: 01/25/2018 MR REFERRING M.D.:  Yates Decamp, MD Study Performed:   Baseline Polysomnogram HISTORY:  Chief complaint according to patient:  "High degree of fatigue- non restorative sleep "  Paul Freeman is a 42 y.o. male patient who reports that he cannot remember ever having restorative or refreshing sleep, he goes to bed and knows that he sleeps but when he wakes up he is as fatigued and exhausted as he felt the evening before.   He had sleep 3 preceding sleep evaluations, the first one 12 -15 years ago in [place], [place], by [facility] Dr. Johnell Comings. He was given a sleep study and told to use CPAP - and he couldn't tolerate CPAP. Dr. Renne Crigler had seen him afterwards for a new evaluation, ONO was normal, ending in a referral to [facility]. He started on anxiety medications but he felt his sleep did not benefit. He also felt sore all the time. Dr. Jacinto Halim tested him for fatigue - and found no evidence of any cardiac disease- normal Echo, EKG and he referred here.  He started him on cholesterol medication. He mentioned his generalized anxiety disorder, possible ADD, ADHD.  The patient was tested for attention deficit disorder by a psychologist and the results are negative. Drinks a lot of coffee, 6-8 Espressi.   The patient endorsed the Epworth Sleepiness Scale at 9 points, the FSS at 41/63 points.   The patient's weight 267 pounds with a height of 74 (inches), resulting in a BMI of 34.2 kg/m2. The patient's neck circumference measured 18.2 inches.  CURRENT MEDICATIONS: Vitamin C, Aspirin, Wellbutrin, Fioricet, CoQ10, Valium, Prinzide, Robaxin, Lopressor, Multivitamin, Crestor, Flonase   PROCEDURE:  This is a multichannel digital polysomnogram utilizing the Somnostar 11.2 system.  Electrodes and sensors were applied and monitored per AASM Specifications.   EEG, EOG, Chin and Limb  EMG, were sampled at 200 Hz.  ECG, Snore and Nasal Pressure, Thermal Airflow, Respiratory Effort, CPAP Flow and Pressure, Oximetry was sampled at 50 Hz. Digital video and audio were recorded.      BASELINE STUDY: Lights Out was at 22:19 and Lights On at 05:01.  Total recording time (TRT) was 402.5 minutes, with a total sleep time (TST) of 384 minutes.   The patient's sleep latency was brief at 4.5 minutes.  REM latency was prolonged at 151.5 minutes.  The sleep efficiency was very high at 95.4 %.     SLEEP ARCHITECTURE: WASO (Wake after sleep onset) was 12 minutes.  There were 8 minutes in Stage N1, 330.5 minutes Stage N2, 0 minutes Stage N3 and 45.5 minutes in Stage REM.  The percentage of Stage N1 was 2.1%, Stage N2 was 88.1%, Stage N3 was 0% and Stage R (REM sleep) was 9.8%.   RESPIRATORY ANALYSIS:  There were a total of 18 respiratory events:  1 obstructive apnea, 0 central apneas and 17 hypopneas. The patient also had several respiratory event related arousals (RERAs). The total APNEA/HYPOPNEA INDEX (AHI) was 2.8 /hour and the total RESPIRATORY DISTURBANCE INDEX was 3.5 /hour.  6 events occurred in REM sleep and 22 events in NREM. The REM AHI was 7.9 /hour, versus a non-REM AHI of 2.1. The patient spent 275 minutes of total sleep time in the supine position and 109 minutes in non-supine.. The supine AHI was 3.7 versus a  non-supine AHI of 0.6.  OXYGEN SATURATION & C02:  The Wake baseline 02 saturation was 94%, with the lowest being 83%. Time spent below 89% saturation equaled 41 minutes.   AROUSALS/ PERIODIC LIMB MOVEMENTS:  The arousals were noted as: 18 were spontaneous, 0 were associated with PLMs, and 3 were associated with respiratory events. The patient had a total of 0 Periodic Limb Movements.    Audio and video analysis did not show any abnormal or unusual movements, behaviors, phonations or vocalizations. It was unusual how long the patient slept in one position- motionless. The EMG leg  channels were barely activated for several hours.   Snoring was noted. EKG was in keeping with normal sinus rhythm (NSR).  Post-study, the patient indicated that sleep was better than usual. He achieved 6.5 hours of almost uninterrupted sleep,   IMPRESSION:  1. Primary Snoring- Loud, but not with significant apnea, hypoxemia, EKG changes. Snoring can be addressed by dental device.  2. High Sleep efficiency without N3 sleep. 3. Reduced REM sleep in proportion to NREM sleep- likely a side effect of medication. 4. Direct arousals out of REM sleep (Dream induced?) 5. Unusual was the motionless sleep as seen on video and lack of activation of EMG channels.     RECOMMENDATIONS: There is a different sleep perception from the objective sleep data- and fatigue can be related to medication side effects.  In context with the patient's description of a severe fatigue, I have pursued testing for thalassemia and for hemolytic Fava anemia.     I certify that I have reviewed the entire raw data recording prior to the issuance of this report in accordance with the Standards of Accreditation of the American Academy of Sleep Medicine (AASM)    Melvyn Novas, MD    01-26-2018  Diplomat, American Board of Psychiatry and Neurology  Diplomat, American Board of Sleep Medicine Medical Director, [facility] [employer]

## 2018-01-30 LAB — GLUCOSE 6 PHOSPHATE DEHYDROGENASE: Hemoglobin: 15.5 g/dL (ref 13.0–17.7)

## 2018-02-01 ENCOUNTER — Telehealth: Payer: Self-pay | Admitting: Neurology

## 2018-02-01 NOTE — Telephone Encounter (Signed)
-----   Message from Larey Seat, MD sent at 02/01/2018  2:10 PM EST ----- Yes, Favism, hemolytic anemia can be aggravated by antibiotics, any infection and is known to leave fatigue and lethargy - this can be an axplanaition for the sensation of fatigue.  Note the normal sleep study in comparison.

## 2018-02-01 NOTE — Telephone Encounter (Signed)
Called the patient and reviewed the sleep study with him advising that the study was pretty normal with minimal apnea. The lab work that we had tested to look to see if he carried the gene for favism came back decreased which would confirm that he has this and its hemolytic anemia. This can be aggravated by antibiotics and infections and can leave the patient lethargic. There is not a treatment necessarily that can be started to help this as it is mainly avoiding certain medications and foods that cause the fatigue to become worse. Patient was thrilled to have and answer but wanted to scheduled a follow up apt. Offered first available was 03/14/17 at 10:30 am. Will place on wait list in case something else opens up. Pt verbalized understanding.

## 2018-02-28 ENCOUNTER — Encounter: Payer: Self-pay | Admitting: Neurology

## 2018-02-28 ENCOUNTER — Ambulatory Visit: Payer: BLUE CROSS/BLUE SHIELD | Admitting: Neurology

## 2018-02-28 VITALS — BP 130/90 | HR 76 | Ht 74.0 in | Wt 267.0 lb

## 2018-02-28 DIAGNOSIS — G4734 Idiopathic sleep related nonobstructive alveolar hypoventilation: Secondary | ICD-10-CM | POA: Diagnosis not present

## 2018-02-28 DIAGNOSIS — D550 Anemia due to glucose-6-phosphate dehydrogenase [G6PD] deficiency: Secondary | ICD-10-CM

## 2018-02-28 DIAGNOSIS — R0683 Snoring: Secondary | ICD-10-CM | POA: Diagnosis not present

## 2018-02-28 NOTE — Patient Instructions (Signed)
  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

## 2018-02-28 NOTE — Progress Notes (Signed)
0.3 level of  G-6-PD enzyme.  SLEEP MEDICINE CLINIC   Provider:  Larey Seat, MD   Primary Care Physician:  Paul Pretty, MD, but referral through Paul Paul Gip, MD.    Referring Provider: Adrian Prows, MD    Chief Complaint  Patient presents with  . Sleep Consult    Np for sleep consult. Alone. Rm 11. Patient mentioned that he has been dealing with disturbed sleep and fatigue for 25 years. 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 The disease is known to cause LFT elevation , sometimes even  jaundice and nausea, and severe fatigue, often followed by abdominal pain and flank pain and myalgia.    HPI:  Paul Freeman is a 43 y.o. male patient , seen on 01-03-2018  Upon referral from Paul. Einar Freeman. I have the pleasure of meeting today for the first time [name], originally from [place], [place]. 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   Mr. Paul Freeman reports that he cannot remember ever having restorative or refreshing sleep, he goes to bed he knows he sleeps but when he wakes up he is as fatigued and exhausted as he felt the evening before.   He had sleep 3 preceding sleep evaluations, the first one 12 -15 years ago in [place], [place], by [facility] Paul. Tonia Freeman. He was given a sleep study and told to use CPAP - and he couldn' t tolerate CPAP. Paul Freeman had seen him afterwards for a new evaluation, ONO was normal, ended in a referral to Paul Freeman. He started on anxiety medications but he felt his sleep did not benefit. He also felt sore all the time.  He is not sure if he had another sleep test since. Paul Paul Freeman tested him for fatigue - and found no evidence of any cardiac disease- normal Echo, EKG and he referred here.  He started  him on cholesterol medication. He mentioned his generalized anxiety disorder, possible ADD, ADHD.  The patient was tested for attention deficit disorder by a psychologist and the results are negative..   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  He usually comes home for dinner at about 6 PM, he will spend the evening with his wife and family, he goes to the bedroom at 10 PM but he is not asleep before midnight, and he watches TV in his bedroom. His wife is equally unhappy about this habit but she has told him that he snores less than he used to.  He usually goes to sleep on his left side, he has one pillow that he embraces or cradles as he sleeps.  He wakes up at least once in the morning at 4 AM for bathroom break. He used not to dream for many years.  Rises at 6.45 with difficulties- his wife tries to wake him from before 6 AM on. He drives the children to school.  He is groggy and achy until 10 AM- its better after that.   Sleep Medical history:  OSA 12 years ago, couldn't tolerate CPAP- anxiety, ADD(?). Coronary calcification.  Detailed  review of Paul. Irven Freeman notes.   Had elevated LFTs in 2005- " through the roof" north of 800 units. Lasted for 6 month , trigger unknown, resolved. Nausea stayed.   Brother with MS, sees Paul Freeman, all paternal male 26 members had severe fatigue, achiness and soreness.- starting in their teens or twenties.  Mother of [place] ancestry. .  Social history: married, self employed, 2 children 40 year old daughter, 58 year old son, former smoker - quit 2013- ETOH , 1-2 drinks a month, caffeine - coffee 2-6 cups a day ( Espresso).   Review of Systems: Out of a complete 14 system review, the patient complains of only the following symptoms, and all other reviewed systems are negative. Snoring, fatigue-  no naps.   Epworth Sleepiness Score : 9/ 24  , Fatigue severity score 41/ 63 points   , depression score- n/a     IMPRESSION:  1. Primary Snoring- Loud, but not with significant apnea,  hypoxemia, EKG changes. Snoring can be addressed by dental  device.  2. High Sleep efficiency without N3 sleep. 3. Reduced REM sleep in proportion to NREM sleep- likely a side  effect of medication. 4. Direct arousals out of REM sleep (Dream induced?) 5. Unusual was the motionless sleep as seen on video and lack of  activation of EMG channels.  Social History   Socioeconomic History  . Marital status: Married    Spouse name: Not on file  . Number of children: Not on file  . Years of education: Not on file  . Highest education level: Not on file  Occupational History  . Not on file  Social Needs  . Financial resource strain: Not on file  . Food insecurity:    Worry: Not on file    Inability: Not on file  . Transportation needs:    Medical: Not on file    Non-medical: Not on file  Tobacco Use  . Smoking status: Former Smoker    Packs/day: 0.50    Years: 24.00    Pack years: 12.00    Last attempt to quit: 2013    Years since quitting: 6.9  . Smokeless tobacco: Never Used  Substance and Sexual Activity  . Alcohol use: Yes    Comment: occ  . Drug use: No Quit smoking 2013   . Caffeine  Espresso - up to 8 a day!!  Lifestyle  . Physical activity:    Days per week:     Minutes per session:   . Stress:   Relationships  . Social connections:                                . Intimate partner violence:                   Forced sexual activity:   Other Topics Concern  . Not on file  Social History Narrative  . Not on file    Family History  Problem Relation Age of Onset  . Cancer Mother        breast  . Cancer Father        prostate  . Hypertension Father   . Atrial fibrillation Father   . Multiple sclerosis Brother     Past Medical History:  Diagnosis Date  .  Anxiety   . Depression   . Hypertension   . Mild hyperlipidemia   . Pneumonia   . Sleep apnea  Past Surgical History:  Procedure Laterality Date  . APPENDECTOMY    . CYSTECTOMY  2008   cyst removal from left armpit    Current Outpatient Medications  Medication Sig Dispense Refill  . buPROPion (WELLBUTRIN XL) 150 MG 24 hr tablet Take 3 tablets by mouth daily.  2  . butalbital-acetaminophen-caffeine (FIORICET, ESGIC) 50-325-40 MG tablet     . Coenzyme Q10 (CO Q-10) 100 MG CAPS Take by mouth.    . diazepam (VALIUM) 10 MG tablet TAKE 1/2 TO 1 TABLET BY MOUTH 2-3 TIMES DAILY AS NEEDED    . fluticasone (FLONASE) 50 MCG/ACT nasal spray Place into both nostrils daily.    . methocarbamol (ROBAXIN) 500 MG tablet TAKE 1 TO 2 TABLETS TWICE A DAY AS NEEDED    . metoprolol tartrate (LOPRESSOR) 100 MG tablet Take 100 mg by mouth 2 (two) times daily.     . Multiple Vitamin (MULTIVITAMIN) tablet Take 1 tablet by mouth daily.    . rosuvastatin (CRESTOR) 10 MG tablet TAKE 1 TABLET BY MOUTH EVERY DAY    . valsartan (DIOVAN) 160 MG tablet     . escitalopram (LEXAPRO) 10 MG tablet      No current facility-administered medications for this visit.     Allergies as of 02/28/2018 - Review Complete 02/28/2018  Allergen Reaction Noted  . Penicillins Other (See Comments) 06/02/2010    Vitals: BP 130/90   Pulse 76   Ht 6\' 2"  (1.88 m)   Wt 267 lb (121.1 kg)   BMI 34.28 kg/m  Last Weight:  Wt Readings from Last 1 Encounters:  02/28/18 267 lb (121.1 kg)   RCV:ELFY mass index is 34.28 kg/m.     Last Height:   Ht Readings from Last 1 Encounters:  02/28/18 6\' 2"  (1.88 m)    Physical exam:  General: The patient is awake, alert and appears not in acute distress. The patient is well groomed. Head: Normocephalic, atraumatic. Neck is supple. Mallampati 2 elongated uvula. ,  neck circumference:18.25 . Nasal airflow congested ,Retrognathia is noted. .  Cardiovascular:  Regular rate and rhythm ,  without  murmurs or carotid bruit, and without distended neck veins. Respiratory: Lungs are clear to auscultation. Skin:  Without evidence of edema, or rash Trunk: BMI is 34.28. The patient's posture is stooped.   Neurologic exam : The patient is awake and alert, oriented to place and time.   Attention span & concentration ability appears normal.  Speech is fluent, without dysarthria, dysphonia or aphasia.  Mood and affect are aloof   Cranial nerves: Pupils are equal and briskly reactive to light.  Extraocular movements  in vertical and horizontal planes intact and without nystagmus. Visual fields by finger perimetry are intact. Hearing to finger rub intact. Facial sensation intact to fine touch.Facial motor strength is symmetric and tongue and uvula move midline. Shoulder shrug was symmetrical.   Motor exam:   Normal tone, muscle bulk and symmetric strength in all extremities.  Sensory:  Fine touch, pinprick and vibration were tested in all extremities. Proprioception tested in the upper extremities was normal.  Coordination: Rapid alternating movements in the fingers/hands was normal. Finger-to-nose maneuver  normal without evidence of ataxia, dysmetria or tremor.  Gait and station: Patient walks without assistive device and is able unassisted to climb up to the exam table. Strength within normal limits.  Stance is stable and normal.  Toe and heel stand were tested . Turns with 3 Steps. Romberg testing is negative.  Deep tendon  reflexes: in the  upper and lower extremities are symmetric and intact. Babinski maneuver response is  downgoing.    Assessment:  After physical and neurologic examination, review of laboratory studies,  Personal review of imaging studies, reports of other /same  Imaging studies, results of polysomnography and / or neurophysiology testing and pre-existing records as far as provided in visit., my assessment is   1) Favism, Thalassemia- SICILIAN ANCESTRY . I  believe this causes his soreness, achiness and tendency to elevated LFTs.   2) no significant OSA- he has retrognathia - he has anxiety.  He may well respond to a dental device !   3) Obesity- needs to change diet - high protein and lower carb. We need to look at this in order to avoid oxidative stress.  No red wine, no [place] pale ale or over 8 % alcohol beer (light beer is allowed). No ASA, high dose vit C, and no malaria prophylaxis.   The patient was advised of the nature of the diagnosed disorder, the treatment options and the  risks for general health and wellness arising from not treating the condition.   I spent more than 50 minutes of face to face time with the patient.  Greater than 50% of time was spent in counseling and coordination of care. We have discussed the diagnosis and differential and I answered the patient's questions.    Plan:  Treatment plan and additional workup :  Thalassemia panel, CK CKMB, sed rate , c-reactive protein, ANA and sleep study.    Rv after sleep test/   Paul Seat, MD 6/94/5038, 8:82 AM  Certified in Neurology by ABPN Certified in [place] by [facility] [address], [place], [place] [address]    Addendum 01-17-2018- still waiting for thalassemia panel!       Favism hemolytic Common More than 200,000 [place] cases per year Treatment can help, but this condition can't be cured Requires a medical diagnosis Lab tests or imaging always required Chronic: can last for years or be lifelong Glucose-6-phosphate dehydrogenase (G6PD) deficiency is an inherited condition usually occurring in males. It's more common in those of African and Mediterranean descent. Triggers include infections, stress, fava beans, aspirin, and other drugs. When symptoms are triggered, they include fever, dark urine, abdominal and back pain, fatigue, and pale skin. Most people recover in a few days without treatment. However,  patients are at risk of recurrent episodes, so avoidance of triggers is critical.

## 2018-03-02 DIAGNOSIS — B360 Pityriasis versicolor: Secondary | ICD-10-CM | POA: Diagnosis not present

## 2018-03-02 DIAGNOSIS — D1801 Hemangioma of skin and subcutaneous tissue: Secondary | ICD-10-CM | POA: Diagnosis not present

## 2018-03-02 DIAGNOSIS — D225 Melanocytic nevi of trunk: Secondary | ICD-10-CM | POA: Diagnosis not present

## 2018-03-09 DIAGNOSIS — E785 Hyperlipidemia, unspecified: Secondary | ICD-10-CM | POA: Diagnosis not present

## 2018-03-09 DIAGNOSIS — D75A Glucose-6-phosphate dehydrogenase (G6PD) deficiency without anemia: Secondary | ICD-10-CM | POA: Diagnosis not present

## 2018-03-09 DIAGNOSIS — I10 Essential (primary) hypertension: Secondary | ICD-10-CM | POA: Diagnosis not present

## 2018-03-14 ENCOUNTER — Ambulatory Visit: Payer: Self-pay | Admitting: Neurology

## 2018-04-04 ENCOUNTER — Encounter: Payer: Self-pay | Admitting: Cardiology

## 2018-04-04 ENCOUNTER — Ambulatory Visit (INDEPENDENT_AMBULATORY_CARE_PROVIDER_SITE_OTHER): Payer: BLUE CROSS/BLUE SHIELD | Admitting: Cardiology

## 2018-04-04 VITALS — BP 147/92 | HR 76 | Ht 74.0 in | Wt 260.0 lb

## 2018-04-04 VITALS — BP 147/92 | HR 72 | Ht 74.0 in

## 2018-04-04 DIAGNOSIS — I10 Essential (primary) hypertension: Secondary | ICD-10-CM | POA: Diagnosis not present

## 2018-04-04 DIAGNOSIS — D75A Glucose-6-phosphate dehydrogenase (G6PD) deficiency without anemia: Secondary | ICD-10-CM

## 2018-04-04 DIAGNOSIS — I2510 Atherosclerotic heart disease of native coronary artery without angina pectoris: Secondary | ICD-10-CM

## 2018-04-04 DIAGNOSIS — E785 Hyperlipidemia, unspecified: Secondary | ICD-10-CM

## 2018-04-04 DIAGNOSIS — R5382 Chronic fatigue, unspecified: Secondary | ICD-10-CM | POA: Diagnosis not present

## 2018-04-04 MED ORDER — ATENOLOL 100 MG PO TABS: 100.0000 mg | ORAL_TABLET | Freq: Two times a day (BID) | ORAL | 0 refills | Status: DC

## 2018-04-04 NOTE — Progress Notes (Signed)
Subjective:  Primary Physician:  [name], MD  Patient ID: Paul Freeman, male    DOB: [birth year], 43 y.o.   MRN: [id number]  Chief Complaint  Patient presents with  . Hypertension    HPI: Paul Freeman  is a 43 y.o. male  with difficulty in controlling his blood pressure, he does not have sleep apnea, he was recently diagnosed with G6PD deficiency, hence lisinopril HCT was discontinued.  Since then he has noticed marked increase in blood pressure.  But he also attributes his elevated blood pressure to be related to extreme anxiety.  He comes in for blood pressure evaluation.  No chest pain, no shortness of breath.  Otherwise remains active.  Past Medical History:  Diagnosis Date  . Anxiety   . Depression   . Hypertension   . Mild hyperlipidemia   . Pneumonia   . Sleep apnea     Past Surgical History:  Procedure Laterality Date  . APPENDECTOMY    . CYSTECTOMY  2008   cyst removal from left armpit    Social History   Socioeconomic History  . Marital status: Married    Spouse name: Not on file  . Number of children: Not on file  . Years of education: Not on file  . Highest education level: Not on file  Occupational History  . Not on file  Social Needs  . Financial resource strain: Not on file  . Food insecurity:    Worry: Not on file    Inability: Not on file  . Transportation needs:    Medical: Not on file    Non-medical: Not on file  Tobacco Use  . Smoking status: Former Smoker    Packs/day: 0.50    Years: 24.00    Pack years: 12.00    Last attempt to quit: 2013    Years since quitting: 7.1  . Smokeless tobacco: Never Used  Substance and Sexual Activity  . Alcohol use: Yes    Comment: occ  . Drug use: No  . Sexual activity: Not on file  Lifestyle  . Physical activity:    Days per week: Not on file    Minutes per session: Not on file  . Stress: Not on file  Relationships  . Social connections:    Talks on phone: Not on file    Gets  together: Not on file    Attends religious service: Not on file    Active member of club or organization: Not on file    Attends meetings of clubs or organizations: Not on file    Relationship status: Not on file  . Intimate partner violence:    Fear of current or ex partner: Not on file    Emotionally abused: Not on file    Physically abused: Not on file    Forced sexual activity: Not on file  Other Topics Concern  . Not on file  Social History Narrative  . Not on file    Current Outpatient Medications on File Prior to Visit  Medication Sig Dispense Refill  . buPROPion (WELLBUTRIN XL) 150 MG 24 hr tablet Take 3 tablets by mouth daily.  2  . diazepam (VALIUM) 10 MG tablet TAKE 1/2 TO 1 TABLET BY MOUTH 2-3 TIMES DAILY AS NEEDED    . fluticasone (FLONASE) 50 MCG/ACT nasal spray Place into both nostrils daily.    . methocarbamol (ROBAXIN) 500 MG tablet TAKE 1 TO 2 TABLETS TWICE A DAY AS NEEDED    .  Multiple Vitamin (MULTIVITAMIN) tablet Take 1 tablet by mouth daily.    . rosuvastatin (CRESTOR) 10 MG tablet TAKE 1 TABLET BY MOUTH EVERY DAY    . Coenzyme Q10 (CO Q-10) 100 MG CAPS Take by mouth.    Marland Kitchen lisinopril-hydrochlorothiazide (PRINZIDE,ZESTORETIC) 20-12.5 MG tablet Take 1 tablet by mouth daily.     No current facility-administered medications on file prior to visit.     Review of Systems  Constitutional: Positive for malaise/fatigue. Negative for weight loss.  Respiratory: Negative for cough, hemoptysis and shortness of breath.   Cardiovascular: Negative for chest pain, palpitations, claudication and leg swelling.  Gastrointestinal: Negative for abdominal pain, blood in stool, constipation, heartburn and vomiting.  Genitourinary: Negative for dysuria.  Musculoskeletal: Negative for joint pain and myalgias.  Neurological: Negative for dizziness, focal weakness and headaches.  Endo/Heme/Allergies: Does not bruise/bleed easily.  Psychiatric/Behavioral: Negative for depression. The  patient is nervous/anxious and has insomnia.   All other systems reviewed and are negative.      Objective:  Blood pressure (!) 147/92, pulse 76, height 6\' 2"  (1.88 m), weight 260 lb (117.9 kg), SpO2 96 %. Body mass index is 33.38 kg/m.  Physical Exam  Constitutional: He appears well-developed and well-nourished. No distress.  HENT:  Head: Atraumatic.  Eyes: Conjunctivae are normal.  Neck: Neck supple. No JVD present. No thyromegaly present.  Cardiovascular: Normal rate, regular rhythm, normal heart sounds and intact distal pulses. Exam reveals no gallop.  No murmur heard. Pulmonary/Chest: Effort normal and breath sounds normal.  Abdominal: Soft. Bowel sounds are normal.  Musculoskeletal: Normal range of motion.        General: No edema.  Neurological: He is alert.  Skin: Skin is warm and dry.  Psychiatric: He has a normal mood and affect.    CARDIAC STUDIES:   Echo- 02/28/2017 1. Left ventricle cavity is normal in size. Mild concentric hypertrophy of the left ventricle. Normal global wall motion. Normal diastolic filling pattern. Calculated EF 59%. 2. Left atrial cavity is mildly dilated. 3. Mild (Grade I) mitral regurgitation. 4. Mild tricuspid regurgitation. No evidence of pulmonary hypertension. 5. IVC is dilated with respiratory variation.   Coronary calcium score 05/10/2016: On a calcium score of 83.6, 94 percentile for subjects in the same age group. Based on age 50 years. Old granular  disease.    Treadmill exercise stress test 02/24/2017: Indication: Screening for CAD, hypertension The patient exercised on Bruce protocol for 9:44 min. Patient achieved 11.36 METS and reached HR 185 bpm, which is 102 % of maximum age-predicted HR. Stress test terminated due to fatigue. Resting EKG demonstrates Normal sinus rhythm. ST Changes: With peak exercise there was no ST-T changes of ischemia. Chest Pain: none. BP Response to Exercise: Normal resting BP- appropriate response.  Hypertensive at rest 164/102 mm Hg. However in recovery, patient developed severe hypotension with BP 80/60 mm Hg. Took 10 minutes before he recuperated with normal BP and dizziness subsided. No arrhythmias. Rec: Patient's BP response is probably normal but exaggerated response with vasodilatation post exercise. Excellent effort without chest pain or dyspnea. Recommend echocardiogram to evaluate LV function.   Assessment & Recommendations:   1. Essential hypertension - atenolol (TENORMIN) 100 MG tablet; Take 1 tablet (100 mg total) by mouth 2 (two) times daily for 30 days.  2. Mild hyperlipidemia 06/09/2017: Cholesterol 139, triglycerides 134, HDL 48, LDL 64. Creatinine 0.91, EGFR 105/121, potassium 4.4, CMP normal. Labs 06/23/2016: CBC normal, CMP normal, TSH normal, vitamin D 31. Total cholesterol 163,  triglycerides 132, HDL 42, LDL 95. PSA normal.   3. G6PD deficiency 01/25/18: G-6-PD, Quant 4.6 - 13.5 U/g Hb <.3Low      4. Chronic fatigue   Recommendation:  Patient is presently doing well and blood pressure is still elevated, due to G6PD deficiency, do not want to use greater than 12.5 mg of HCTZ.  However blood pressure still remains elevated.  He is also developing side effects from metoprolol and may include his chronic fatigue and also cold extremity.  I would like to try atenolol.  Patient has previously tried Arts development officer with no success.  He had use propranolol in the past, but caused him to have profound fatigue as well.  Otherwise he seems to be doing well, he is on statin therapy for hyperlipidemia, continues to follow-up with Dr. [name] for primary care needs.  I'll see him back in 2 months for follow-up.  Yates Decamp, MD, [facility] 04/04/2018, 9:26 AM [facility] Pager: [phone] Office: [phone] If no answer Cell [phone]

## 2018-04-06 ENCOUNTER — Other Ambulatory Visit: Payer: Self-pay | Admitting: Cardiology

## 2018-04-06 DIAGNOSIS — I10 Essential (primary) hypertension: Secondary | ICD-10-CM

## 2018-04-18 NOTE — Progress Notes (Deleted)
Subjective:  Primary Physician:  [name], MD  Patient ID: Paul Freeman, male    DOB: 09/21/1975, 43 y.o.   MRN: [id number]  No chief complaint on file.   HPI: Paul Freeman  is a 43 y.o. male  with difficulty in controlling his blood pressure, he does not have sleep apnea, he was recently diagnosed with G6PD deficiency, hence lisinopril HCT was discontinued.  Since then he has noticed marked increase in blood pressure.  But he also attributes his elevated blood pressure to be related to extreme anxiety.  He comes in for blood pressure evaluation.  No chest pain, no shortness of breath.  Otherwise remains active.  Past Medical History:  Diagnosis Date  . Anxiety   . Depression   . Hypertension   . Mild hyperlipidemia   . Pneumonia   . Sleep apnea     Past Surgical History:  Procedure Laterality Date  . APPENDECTOMY    . CYSTECTOMY  2008   cyst removal from left armpit    Social History   Socioeconomic History  . Marital status: Married    Spouse name: Not on file  . Number of children: Not on file  . Years of education: Not on file  . Highest education level: Not on file  Occupational History  . Not on file  Social Needs  . Financial resource strain: Not on file  . Food insecurity:    Worry: Not on file    Inability: Not on file  . Transportation needs:    Medical: Not on file    Non-medical: Not on file  Tobacco Use  . Smoking status: Former Smoker    Packs/day: 0.50    Years: 24.00    Pack years: 12.00    Last attempt to quit: 2013    Years since quitting: 7.1  . Smokeless tobacco: Never Used  Substance and Sexual Activity  . Alcohol use: Yes    Comment: occ  . Drug use: No  . Sexual activity: Not on file  Lifestyle  . Physical activity:    Days per week: Not on file    Minutes per session: Not on file  . Stress: Not on file  Relationships  . Social connections:    Talks on phone: Not on file    Gets together: Not on file    Attends  religious service: Not on file    Active member of club or organization: Not on file    Attends meetings of clubs or organizations: Not on file    Relationship status: Not on file  . Intimate partner violence:    Fear of current or ex partner: Not on file    Emotionally abused: Not on file    Physically abused: Not on file    Forced sexual activity: Not on file  Other Topics Concern  . Not on file  Social History Narrative  . Not on file    Current Outpatient Medications on File Prior to Visit  Medication Sig Dispense Refill  . atenolol (TENORMIN) 100 MG tablet Take 1 tablet (100 mg total) by mouth 2 (two) times daily for 30 days. 60 tablet 0  . buPROPion (WELLBUTRIN XL) 150 MG 24 hr tablet Take 3 tablets by mouth daily.  2  . Coenzyme Q10 (CO Q-10) 100 MG CAPS Take by mouth.    . diazepam (VALIUM) 10 MG tablet TAKE 1/2 TO 1 TABLET BY MOUTH 2-3 TIMES DAILY AS NEEDED    .  fluticasone (FLONASE) 50 MCG/ACT nasal spray Place into both nostrils daily.    Marland Kitchen lisinopril (PRINIVIL,ZESTRIL) 20 MG tablet TAKE 1 TABLET BY MOUTH EVERY DAY 30 tablet 1  . lisinopril-hydrochlorothiazide (PRINZIDE,ZESTORETIC) 20-12.5 MG tablet Take 1 tablet by mouth daily.    . methocarbamol (ROBAXIN) 500 MG tablet TAKE 1 TO 2 TABLETS TWICE A DAY AS NEEDED    . Multiple Vitamin (MULTIVITAMIN) tablet Take 1 tablet by mouth daily.    . rosuvastatin (CRESTOR) 10 MG tablet TAKE 1 TABLET BY MOUTH EVERY DAY     No current facility-administered medications on file prior to visit.     Review of Systems  Constitutional: Positive for malaise/fatigue. Negative for weight loss.  Respiratory: Negative for cough, hemoptysis and shortness of breath.   Cardiovascular: Negative for chest pain, palpitations, claudication and leg swelling.  Gastrointestinal: Negative for abdominal pain, blood in stool, constipation, heartburn and vomiting.  Genitourinary: Negative for dysuria.  Musculoskeletal: Negative for joint pain and myalgias.   Neurological: Negative for dizziness, focal weakness and headaches.  Endo/Heme/Allergies: Does not bruise/bleed easily.  Psychiatric/Behavioral: Negative for depression. The patient is nervous/anxious and has insomnia.   All other systems reviewed and are negative.      Objective:  There were no vitals taken for this visit. There is no height or weight on file to calculate BMI.  Physical Exam  Constitutional: He appears well-developed and well-nourished. No distress.  HENT:  Head: Atraumatic.  Eyes: Conjunctivae are normal.  Neck: Neck supple. No JVD present. No thyromegaly present.  Cardiovascular: Normal rate, regular rhythm, normal heart sounds and intact distal pulses. Exam reveals no gallop.  No murmur heard. Pulmonary/Chest: Effort normal and breath sounds normal.  Abdominal: Soft. Bowel sounds are normal.  Musculoskeletal: Normal range of motion.        General: No edema.  Neurological: He is alert.  Skin: Skin is warm and dry.  Psychiatric: He has a normal mood and affect.    CARDIAC STUDIES:   Echo- 02/28/2017 1. Left ventricle cavity is normal in size. Mild concentric hypertrophy of the left ventricle. Normal global wall motion. Normal diastolic filling pattern. Calculated EF 59%. 2. Left atrial cavity is mildly dilated. 3. Mild (Grade I) mitral regurgitation. 4. Mild tricuspid regurgitation. No evidence of pulmonary hypertension. 5. IVC is dilated with respiratory variation.   Coronary calcium score 05/10/2016: On a calcium score of 83.6, 94 percentile for subjects in the same age group. Based on age 23 years. Old granular  disease.    Treadmill exercise stress test 02/24/2017: Indication: Screening for CAD, hypertension The patient exercised on Bruce protocol for 9:44 min. Patient achieved 11.36 METS and reached HR 185 bpm, which is 102 % of maximum age-predicted HR. Stress test terminated due to fatigue. Resting EKG demonstrates Normal sinus rhythm. ST  Changes: With peak exercise there was no ST-T changes of ischemia. Chest Pain: none. BP Response to Exercise: Normal resting BP- appropriate response. Hypertensive at rest 164/102 mm Hg. However in recovery, patient developed severe hypotension with BP 80/60 mm Hg. Took 10 minutes before he recuperated with normal BP and dizziness subsided. No arrhythmias. Rec: Patient's BP response is probably normal but exaggerated response with vasodilatation post exercise. Excellent effort without chest pain or dyspnea. Recommend echocardiogram to evaluate LV function.   Assessment & Recommendations:   1. Essential hypertension - atenolol (TENORMIN) 100 MG tablet; Take 1 tablet (100 mg total) by mouth 2 (two) times daily for 30 days.  2. Mild hyperlipidemia  06/09/2017: Cholesterol 139, triglycerides 134, HDL 48, LDL 64. Creatinine 0.91, EGFR 105/121, potassium 4.4, CMP normal. Labs 06/23/2016: CBC normal, CMP normal, TSH normal, vitamin D 31. Total cholesterol 163, triglycerides 132, HDL 42, LDL 95. PSA normal.   3. G6PD deficiency 01/25/18: G-6-PD, Quant 4.6 - 13.5 U/g Hb <.3Low      4. Chronic fatigue   Recommendation:  Patient is presently doing well and blood pressure is still elevated, due to G6PD deficiency, do not want to use greater than 12.5 mg of HCTZ.  However blood pressure still remains elevated.  He is also developing side effects from metoprolol and may include his chronic fatigue and also cold extremity.  I would like to try atenolol.  Patient has previously tried Arts development officer with no success.  He had use propranolol in the past, but caused him to have profound fatigue as well.  Otherwise he seems to be doing well, he is on statin therapy for hyperlipidemia, continues to follow-up with Dr. [name] for primary care needs.  I'll see him back in 2 months for follow-up.  Colon Branch, MD, [facility] 04/18/2018, 2:12 PM [facility] Pager: [phone] Office: [phone] If no  answer Cell [phone]

## 2018-04-19 ENCOUNTER — Ambulatory Visit: Payer: Self-pay | Admitting: Cardiology

## 2018-04-29 ENCOUNTER — Other Ambulatory Visit: Payer: Self-pay | Admitting: Cardiology

## 2018-04-29 DIAGNOSIS — I10 Essential (primary) hypertension: Secondary | ICD-10-CM

## 2018-04-30 NOTE — Telephone Encounter (Signed)
Is this ok to refill for pt

## 2018-04-30 NOTE — Telephone Encounter (Signed)
yes

## 2018-05-02 ENCOUNTER — Other Ambulatory Visit: Payer: Self-pay

## 2018-05-02 DIAGNOSIS — I10 Essential (primary) hypertension: Secondary | ICD-10-CM

## 2018-05-02 MED ORDER — LISINOPRIL 20 MG PO TABS
20.0000 mg | ORAL_TABLET | Freq: Every day | ORAL | 3 refills | Status: DC
Start: 2018-05-02 — End: 2018-07-31

## 2018-05-02 MED ORDER — ATENOLOL 100 MG PO TABS
100.0000 mg | ORAL_TABLET | Freq: Two times a day (BID) | ORAL | 2 refills | Status: DC
Start: 2018-05-02 — End: 2018-09-19

## 2018-05-02 MED ORDER — LISINOPRIL-HYDROCHLOROTHIAZIDE 20-12.5 MG PO TABS: 1.0000 | ORAL_TABLET | Freq: Every day | ORAL | 1 refills | Status: DC

## 2018-05-17 ENCOUNTER — Other Ambulatory Visit: Payer: Self-pay | Admitting: Cardiology

## 2018-05-17 DIAGNOSIS — I10 Essential (primary) hypertension: Secondary | ICD-10-CM

## 2018-05-31 ENCOUNTER — Other Ambulatory Visit: Payer: Self-pay | Admitting: Cardiology

## 2018-05-31 DIAGNOSIS — I10 Essential (primary) hypertension: Secondary | ICD-10-CM

## 2018-07-30 ENCOUNTER — Telehealth: Payer: Self-pay | Admitting: Gastroenterology

## 2018-07-30 NOTE — Telephone Encounter (Signed)
  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 Merrily Pew Kish's.  Apparently having significant anorectal pain.   Can you please contact him and his cell phone [phone] and offer him in person office visit with me this Wednesday.  Double book if needed.

## 2018-07-30 NOTE — Telephone Encounter (Signed)
6/24 at   830 am in person appt made and pt aware

## 2018-07-31 ENCOUNTER — Telehealth: Payer: Self-pay

## 2018-07-31 NOTE — Telephone Encounter (Signed)
Covid-19 screening questions   Do you now or have you had a fever in the last 14 days no   Do you have any respiratory symptoms of shortness of breath or cough now or in the last 14 days no  Do you have any family members or close contacts with diagnosed or suspected Covid-19 in the past 14 days no  Have you been tested for Covid-19 and found to be positive no          

## 2018-08-01 ENCOUNTER — Ambulatory Visit: Payer: No Typology Code available for payment source | Admitting: Gastroenterology

## 2018-08-01 ENCOUNTER — Encounter: Payer: Self-pay | Admitting: Gastroenterology

## 2018-08-01 ENCOUNTER — Ambulatory Visit: Payer: BC Managed Care – PPO | Admitting: Gastroenterology

## 2018-08-01 VITALS — BP 124/80 | HR 76 | Temp 97.6°F | Ht 74.0 in | Wt 260.0 lb

## 2018-08-01 DIAGNOSIS — K649 Unspecified hemorrhoids: Secondary | ICD-10-CM

## 2018-08-01 MED ORDER — LIDOCAINE-HYDROCORTISONE ACE 3-2.5 % RE KIT
PACK | RECTAL | 2 refills | Status: DC
Start: 2018-08-01 — End: 2018-09-19

## 2018-08-01 NOTE — Patient Instructions (Addendum)
How to Take a [employer] A sitz bath is a warm water bath that may be used to care for your rectum, genital area, or the area between your rectum and genitals (perineum). For a sitz bath, the water only comes up to your hips and covers your buttocks. A sitz bath may done at home in a bathtub or with a portable sitz bath that fits over the toilet. Your health care provider may recommend a sitz bath to help:  Relieve pain and discomfort after delivering a baby.  Relieve pain and itching from hemorrhoids or anal fissures.  Relieve pain after certain surgeries.  Relax muscles that are sore or tight. How to take a sitz bath Take TWO sitz baths a day. Bathtub sitz bath To take a sitz bath in a bathtub: 1. Partially fill a bathtub with warm water. The water should be deep enough to cover your hips and buttocks when you are sitting in the tub. 2. If your health care provider told you to put medicine in the water, follow his or her instructions. 3. Sit in the water. 4. Open the tub drain a little, and leave it open during your bath. 5. Turn on the warm water again, enough to replace the water that is draining out. Keep the water running throughout your bath. This helps keep the water at the right level and the right temperature. 6. Soak in the water for 15-20 minutes, or as long as told by your health care provider. 7. When you are done, be careful when you stand up. You may feel dizzy. 8. After the sitz bath, pat yourself dry. Do not rub your skin to dry it.  Over-the-toilet sitz bath To take a sitz bath with an over-the-toilet basin: 1. Follow the manufacturer's instructions. 2. Fill the basin with warm water. 3. If your health care provider told you to put medicine in the water, follow his or her instructions. 4. Sit on the seat. Make sure the water covers your buttocks and perineum. 5. Soak in the water for 15-20 minutes, or as long as told by your health care provider. 6. After the sitz  bath, pat yourself dry. Do not rub your skin to dry it. 7. Clean and dry the basin between uses. 8. Discard the basin if it cracks, or according to the manufacturer's instructions. Contact a health care provider if:  Your symptoms get worse. Do not continue with sitz baths if your symptoms get worse.  You have new symptoms. If this happens, do not continue with sitz baths until you talk with your health care provider. Summary  A sitz bath is a warm water bath in which the water only comes up to your hips and covers your buttocks.  A sitz bath may help relieve itching, relieve pain, and relax muscles that are sore or tight in the lower part of your body, including your genital area.  Take 3-4 sitz baths a day, or as many as told by your health care provider. Soak in the water for 15-20 minutes.  Do not continue with sitz baths if your symptoms get worse. This information is not intended to replace advice given to you by your health care provider. Make sure you discuss any questions you have with your health care provider. Document Released: 10/17/2003 Document Revised: 01/26/2017 Document Reviewed: 01/26/2017 Elsevier Interactive Patient Education  2019 [employer].  We have sent the following medications to your pharmacy for you to pick up at your convenience: Anamantle  cream Use twice daily after sitz bath  Take Citrucel fiber Supplement daily  Call our office in two weeks to let us know how you are doing  Thank you for entrusting me with your care and choosing [facility].  Dr Ardis Hughs

## 2018-08-01 NOTE — Progress Notes (Signed)
HPI: This is a very pleasant 43 year old man who was self-referred through a colleague.  He has a friend of Dr. Lyndon Code.  Chief complaint is anal discomfort  He has chronic constipation.  Generally will sit on the toilet for about an hour a day, usually has to push and strain to get a bowel movement out.  Starting within the past week or so he has had significant anal discomfort, minor rectal bleeding.  He has felt up a bump at his bottom that was exquisitely tender over the weekend.  He started taking some fiber supplements on the advice of his wife and his bowels have moved a little easier.  He is used some over-the-counter agents including Preparation H suppositories.  The pain has subsided somewhat but is still fairly uncomfortable.    Review of systems: Pertinent positive and negative review of systems were noted in the above HPI section. All other review negative.   Past Medical History:  Diagnosis Date  . Anxiety   . Depression   . Hypertension   . Mild hyperlipidemia   . Pneumonia   . Sleep apnea     Past Surgical History:  Procedure Laterality Date  . APPENDECTOMY    . CYSTECTOMY  2008   cyst removal from left armpit    Current Outpatient Medications  Medication Sig Dispense Refill  . buPROPion (WELLBUTRIN XL) 150 MG 24 hr tablet Take 3 tablets by mouth daily.  2  . Coenzyme Q10 (CO Q-10) 100 MG CAPS Take by mouth.    . diazepam (VALIUM) 10 MG tablet TAKE 1/2 TO 1 TABLET BY MOUTH 2-3 TIMES DAILY AS NEEDED    . fluticasone (FLONASE) 50 MCG/ACT nasal spray Place into both nostrils daily.    Marland Kitchen lisinopril-hydrochlorothiazide (PRINZIDE,ZESTORETIC) 20-12.5 MG tablet Take 1 tablet by mouth daily. 90 tablet 1  . Multiple Vitamin (MULTIVITAMIN) tablet Take 1 tablet by mouth daily.    . rosuvastatin (CRESTOR) 10 MG tablet TAKE 1 TABLET BY MOUTH EVERY DAY    . atenolol (TENORMIN) 100 MG tablet Take 1 tablet (100 mg total) by mouth 2 (two) times daily for 30 days. 180 tablet 2    No current facility-administered medications for this visit.     Allergies as of 08/01/2018 - Review Complete 08/01/2018  Allergen Reaction Noted  . Penicillins Other (See Comments) 06/02/2010    Family History  Problem Relation Age of Onset  . Breast cancer Mother   . Hypertension Father   . Atrial fibrillation Father   . Prostate cancer Father   . Multiple sclerosis Brother   . Colon cancer Neg Hx     Social History   Socioeconomic History  . Marital status: Married    Spouse name: Not on file  . Number of children: 2  . Years of education: Not on file  . Highest education level: Not on file  Occupational History  . Not on file  Social Needs  . Financial resource strain: Not on file  . Food insecurity    Worry: Not on file    Inability: Not on file  . Transportation needs    Medical: Not on file    Non-medical: Not on file  Tobacco Use  . Smoking status: Former Smoker    Packs/day: 0.50    Years: 24.00    Pack years: 12.00    Quit date: 2013    Years since quitting: 7.4  . Smokeless tobacco: Never Used  Substance and Sexual Activity  .  Alcohol use: Yes    Comment: occ  . Drug use: No  . Sexual activity: Not on file  Lifestyle  . Physical activity    Days per week: Not on file    Minutes per session: Not on file  . Stress: Not on file  Relationships  . Social Herbalist on phone: Not on file    Gets together: Not on file    Attends religious service: Not on file    Active member of club or organization: Not on file    Attends meetings of clubs or organizations: Not on file    Relationship status: Not on file  . Intimate partner violence    Fear of current or ex partner: Not on file    Emotionally abused: Not on file    Physically abused: Not on file    Forced sexual activity: Not on file  Other Topics Concern  . Not on file  Social History Narrative  . Not on file     Physical Exam: BP 124/80   Pulse 76   Temp 97.6 F (36.4  C)   Ht 6\' 2"  (1.88 m)   Wt 260 lb (117.9 kg)   BMI 33.38 kg/m  Constitutional: generally well-appearing Psychiatric: alert and oriented x3 Eyes: extraocular movements intact Mouth: oral pharynx moist, no lesions Neck: supple no lymphadenopathy Cardiovascular: heart regular rate and rhythm Lungs: clear to auscultation bilaterally Abdomen: soft, nontender, nondistended, no obvious ascites, no peritoneal signs, normal bowel sounds Extremities: no lower extremity edema bilaterally Skin: no lesions on visible extremities Rectal examination: Obvious right sided external anal hemorrhoid, this measures about a centimeter, it is soft, purpleish.  It is a bit tender when I palpate it.  No obvious fissures.  Digital rectal exam revealed no distal rectal masses  Assessment and plan: 43 y.o. male with chronic constipation, symptomatic external hemorrhoid  First we went over general recommendations to prevent this from happening again including staying hydrated, starting a fiber supplement on a daily basis, never sitting on the toilet for more than 5 or 10 minutes at a time.  Second he is going to perform sitz bath's twice daily and apply AnaMantle HC externally at the site twice daily as well for the next week or 2.  He will call in 2 weeks to report on his progress.  As long as he responds well I see no reason for any further testing.    Please see the "Patient Instructions" section for addition details about the plan.   Owens Loffler, MD [facility] 08/01/2018, 11:34 AM  Cc: Deland Pretty, MD

## 2018-08-06 ENCOUNTER — Other Ambulatory Visit: Payer: Self-pay

## 2018-08-06 MED ORDER — ROSUVASTATIN CALCIUM 10 MG PO TABS: 10.0000 mg | ORAL_TABLET | Freq: Every day | ORAL | 0 refills | Status: DC

## 2018-09-17 ENCOUNTER — Other Ambulatory Visit: Payer: BC Managed Care – PPO | Admitting: Internal Medicine

## 2018-09-17 ENCOUNTER — Other Ambulatory Visit: Payer: Self-pay

## 2018-09-17 DIAGNOSIS — Z13220 Encounter for screening for lipoid disorders: Secondary | ICD-10-CM

## 2018-09-17 DIAGNOSIS — Z0000 Encounter for general adult medical examination without abnormal findings: Secondary | ICD-10-CM

## 2018-09-17 LAB — COMPLETE METABOLIC PANEL WITH GFR
AG Ratio: 2.2 (calc) (ref 1.0–2.5)
ALT: 24 U/L (ref 9–46)
AST: 19 U/L (ref 10–40)
Albumin: 4.9 g/dL (ref 3.6–5.1)
Alkaline phosphatase (APISO): 52 U/L (ref 36–130)
BUN: 16 mg/dL (ref 7–25)
CO2: 29 mmol/L (ref 20–32)
Calcium: 9.7 mg/dL (ref 8.6–10.3)
Chloride: 103 mmol/L (ref 98–110)
Creat: 1 mg/dL (ref 0.60–1.35)
GFR, Est African American: 107 mL/min/{1.73_m2} (ref >=60)
GFR, Est Non African American: 92 mL/min/{1.73_m2} (ref >=60)
Globulin: 2.2 g/dL (calc) (ref 1.9–3.7)
Glucose, Bld: 90 mg/dL (ref 65–139)
Potassium: 4 mmol/L (ref 3.5–5.3)
Sodium: 140 mmol/L (ref 135–146)
Total Bilirubin: 0.8 mg/dL (ref 0.2–1.2)
Total Protein: 7.1 g/dL (ref 6.1–8.1)

## 2018-09-17 LAB — LIPID PANEL
Cholesterol: 136 mg/dL (ref <200)
HDL: 53 mg/dL (ref >=40)
LDL Cholesterol (Calc): 64 mg/dL (calc)
Non-HDL Cholesterol (Calc): 83 mg/dL (calc) (ref <130)
Total CHOL/HDL Ratio: 2.6 (calc) (ref <5.0)
Triglycerides: 107 mg/dL (ref <150)

## 2018-09-17 LAB — CBC WITH DIFFERENTIAL/PLATELET
Absolute Monocytes: 366 cells/uL (ref 200–950)
Basophils Absolute: 19 cells/uL (ref 0–200)
Basophils Relative: 0.3 %
Eosinophils Absolute: 62 cells/uL (ref 15–500)
Eosinophils Relative: 1 %
HCT: 42.6 % (ref 38.5–50.0)
Hemoglobin: 14.6 g/dL (ref 13.2–17.1)
Lymphs Abs: 1606 cells/uL (ref 850–3900)
MCH: 30.7 pg (ref 27.0–33.0)
MCHC: 34.3 g/dL (ref 32.0–36.0)
MCV: 89.5 fL (ref 80.0–100.0)
MPV: 10.5 fL (ref 7.5–12.5)
Monocytes Relative: 5.9 %
Neutro Abs: 4148 cells/uL (ref 1500–7800)
Neutrophils Relative %: 66.9 %
Platelets: 182 10*3/uL (ref 140–400)
RBC: 4.76 10*6/uL (ref 4.20–5.80)
RDW: 11.6 % (ref 11.0–15.0)
Total Lymphocyte: 25.9 %
WBC: 6.2 10*3/uL (ref 3.8–10.8)

## 2018-09-19 ENCOUNTER — Other Ambulatory Visit: Payer: Self-pay

## 2018-09-19 ENCOUNTER — Encounter: Payer: Self-pay | Admitting: Internal Medicine

## 2018-09-19 ENCOUNTER — Ambulatory Visit: Payer: BC Managed Care – PPO | Admitting: Internal Medicine

## 2018-09-19 VITALS — BP 130/88 | HR 86 | Temp 98.2°F | Ht 74.0 in | Wt 257.0 lb

## 2018-09-19 DIAGNOSIS — J309 Allergic rhinitis, unspecified: Secondary | ICD-10-CM

## 2018-09-19 DIAGNOSIS — Z0000 Encounter for general adult medical examination without abnormal findings: Secondary | ICD-10-CM | POA: Diagnosis not present

## 2018-09-19 DIAGNOSIS — D75A Glucose-6-phosphate dehydrogenase (G6PD) deficiency without anemia: Secondary | ICD-10-CM

## 2018-09-19 DIAGNOSIS — E0590 Thyrotoxicosis, unspecified without thyrotoxic crisis or storm: Secondary | ICD-10-CM | POA: Diagnosis not present

## 2018-09-19 DIAGNOSIS — I10 Essential (primary) hypertension: Secondary | ICD-10-CM

## 2018-09-19 DIAGNOSIS — M542 Cervicalgia: Secondary | ICD-10-CM | POA: Diagnosis not present

## 2018-09-19 DIAGNOSIS — F419 Anxiety disorder, unspecified: Secondary | ICD-10-CM

## 2018-09-19 DIAGNOSIS — Z23 Encounter for immunization: Secondary | ICD-10-CM | POA: Diagnosis not present

## 2018-09-19 DIAGNOSIS — F329 Major depressive disorder, single episode, unspecified: Secondary | ICD-10-CM

## 2018-09-19 DIAGNOSIS — F439 Reaction to severe stress, unspecified: Secondary | ICD-10-CM | POA: Diagnosis not present

## 2018-09-19 DIAGNOSIS — F32A Depression, unspecified: Secondary | ICD-10-CM

## 2018-09-19 LAB — POCT URINALYSIS DIPSTICK
Appearance: NEGATIVE
Bilirubin, UA: NEGATIVE
Blood, UA: NEGATIVE
Glucose, UA: NEGATIVE
Ketones, UA: NEGATIVE
Leukocytes, UA: NEGATIVE
Nitrite, UA: NEGATIVE
Odor: NEGATIVE
Protein, UA: NEGATIVE
Spec Grav, UA: 1.01 (ref 1.010–1.025)
Urobilinogen, UA: 0.2 E.U./dL
pH, UA: 6.5 (ref 5.0–8.0)

## 2018-09-19 MED ORDER — METHOCARBAMOL 500 MG PO TABS: 500.0000 mg | ORAL_TABLET | Freq: Two times a day (BID) | ORAL | 0 refills | Status: DC | PRN

## 2018-09-19 MED ORDER — CETIRIZINE HCL 10 MG PO TABS: 10.0000 mg | ORAL_TABLET | Freq: Every day | ORAL | 3 refills | Status: DC

## 2018-09-19 MED ORDER — LISINOPRIL-HYDROCHLOROTHIAZIDE 20-12.5 MG PO TABS: 1.0000 | ORAL_TABLET | Freq: Every day | ORAL | 1 refills | Status: DC

## 2018-09-19 MED ORDER — BUPROPION HCL ER (XL) 150 MG PO TB24: 150.0000 mg | ORAL_TABLET | Freq: Three times a day (TID) | ORAL | 1 refills | Status: DC

## 2018-09-19 MED ORDER — DIAZEPAM 10 MG PO TABS: ORAL_TABLET | ORAL | 2 refills | Status: DC

## 2018-09-19 MED ORDER — METOPROLOL TARTRATE 100 MG PO TABS: 100.0000 mg | ORAL_TABLET | Freq: Two times a day (BID) | ORAL | 1 refills | Status: DC

## 2018-09-19 MED ORDER — ROSUVASTATIN CALCIUM 10 MG PO TABS: 10.0000 mg | ORAL_TABLET | Freq: Every day | ORAL | 3 refills | Status: DC

## 2018-09-19 NOTE — Progress Notes (Signed)
Subjective:    Patient ID: Paul Freeman, male    DOB: 14-Dec-1975, 43 y.o.   MRN: [id number]  HPI 43 year old Male presents to the office for the first time today for health maintenance exam and evaluation of medical issues.    Previously seen by Dr. Renne Crigler for Primary Care.  He has also seen Dr. Nadara Eaton for Cardiac evaluation.  He had a very careful evaluation by Dr. Jacinto Halim in May 2019.  He has a history of hypertension.  He had an exercise stress test that was normal but had post exercise hypotension.  No evidence of ischemia on EKG.  He has a remote history of smoking.  Quit in 2013 after smoking 1 or 2 packs/day for some 24 years.  Echocardiogram was within normal limits.    Was diagnosed with floaters in left eye by Dr. Nile Riggs in 2019.  These do not bother him.  Dr. Renne Crigler saw him in October 2019.  He has a history of generalized anxiety disorder and essential hypertension.  Also noted history of restless leg syndrome.  History of low TSH consistent with hyperthyroidism and was  referred to Dr. Talmage Nap.  Had thyroid scan at [facility] long showing uniform uptake.  Was tried on methimazole but that caused nausea.  History of dysplastic compound nevus with severe melanocytic atypia upper back.  Had sleep study by Dr. Vickey Huger showing snoring but no significant apnea.  Old records indicate he had Tdap vaccine in 2013   Past medical history includes Appendectomy 1996  Has had urticaria/hives.  Patient seen in Emergency department at [facility] in  2019 with atypical chest pain.  EKG was unremarkable other than intraventricular conduction delay.   Social Hx: He is a non-smoker.  Social alcohol consumption.  Previously smoked from the age of 74 to 89 years. Married. 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 2 children- a son and a daughter.  Wife has had breast cancer.  Family history: Mother with history of breast cancer.  One brother with hypothyroidism.   Children are healthy.  Has seen Dr. Jacinto Halim who according to old records performed stress echo in the Spring 2019. Dr. Nadara Eaton has him on lisinopril, Crestor, metoprolol.  Patient also takes Robaxin as needed for neck pain.    History of allergic rhinitis for which he takes Zyrtec.  Patient has history of anxiety depression.  He and his wife have seen Para March for counseling regarding situational stress with patient's parents.  He says he was also seen at [facility] and was placed on numerous medications in the past all of which he stopped taking.  He currently is on Diazepam and Welbutrin.  These appear to work well for him.  He also evaluated for attention deficit disorder by Dr. Elisabeth Most but was told he did not have ADD.  Underwent appendectomy 1996.  Saw Dr. Vickey Huger in December 2019 for possible sleep apnea but obstructive sleep apnea was ruled out.  He was however found to have G6PD deficiency with level being less than .3.  At the time he was complaining of some muscle pain and not feeling rested after sleeping.  Total CK was normal.  Sed rate was normal.  C-reactive protein was normal.    Review of Systems situational stress with family discussed at length. Could consider counseling.     Objective:   Physical Exam Blood pressure 130/88, pulse 86, temperature 98.2 degrees, pulse oximetry 96%.  Weight 257 pounds.  BMI 33  Skin warm and dry.  Nodes none.  TMs are clear.  Neck is supple without JVD thyromegaly or carotid bruits.  Chest clear to auscultation.  Cardiac exam regular rate and rhythm normal S1 and S2.  Abdomen no hepatosplenomegaly masses or tenderness.  Abdomen soft nondistended without hepatosplenomegaly masses or tenderness.  Rectal exam deferred due to age.  No lower extremity edema.  Neuro no focal deficits.  Affect is normal.  Judgment normal       Assessment & Plan:  Situational stress with family.  Recommend counseling  Anxiety depression treated  with Valium and Wellbutrin  Hyperlipidemia treated with Crestor  Essential hypertension treated with Zestoretic and metoprolol  History of musculoskeletal pain treated with as needed Robaxin  History of G6PD deficiency-has to avoid certain antibiotics  History of hyperthyroidism-need more information.  Apparently at one point was referred to Dr. Talmage Nap  Plan: I would like to see him every 6 months.  Continue follow-up with Dr. Jacinto Halim.  Recommend annual flu vaccine.  Tdap update given today.

## 2018-10-07 NOTE — Patient Instructions (Signed)
It was a pleasure to see you today.  Please return in 6 months for follow-up.  Tetanus immunization update given today.

## 2018-11-03 ENCOUNTER — Other Ambulatory Visit: Payer: Self-pay | Admitting: Internal Medicine

## 2018-11-12 ENCOUNTER — Encounter: Payer: Self-pay | Admitting: Cardiology

## 2018-11-12 ENCOUNTER — Ambulatory Visit (INDEPENDENT_AMBULATORY_CARE_PROVIDER_SITE_OTHER): Payer: BC Managed Care – PPO | Admitting: Cardiology

## 2018-11-12 ENCOUNTER — Other Ambulatory Visit: Payer: Self-pay

## 2018-11-12 VITALS — BP 132/93 | HR 79 | Ht 74.0 in | Wt 259.6 lb

## 2018-11-12 DIAGNOSIS — I10 Essential (primary) hypertension: Secondary | ICD-10-CM

## 2018-11-12 DIAGNOSIS — E785 Hyperlipidemia, unspecified: Secondary | ICD-10-CM | POA: Diagnosis not present

## 2018-11-12 DIAGNOSIS — R0789 Other chest pain: Secondary | ICD-10-CM

## 2018-11-12 DIAGNOSIS — R931 Abnormal findings on diagnostic imaging of heart and coronary circulation: Secondary | ICD-10-CM

## 2018-11-12 MED ORDER — AMLODIPINE BESYLATE 5 MG PO TABS: 5.0000 mg | ORAL_TABLET | Freq: Every day | ORAL | 2 refills | Status: DC

## 2018-11-12 NOTE — Progress Notes (Signed)
Primary Physician/Referring:  Margaree Mackintosh, MD  Patient ID: Paul Freeman, male    DOB: Mar 22, 1975, 43 y.o.   MRN: [id number]  Chief Complaint  Patient presents with  . Chest Pain  . Follow-up   HPI:    Paul Freeman  is a 43 y.o. Caucasian male with hypertension, extreme anxiety, mild  G6PD deficiency, who has responded well to blood pressure control with lisinopril HCT, HCT utilized in spite of G6PD deficiency at a low dose.  He has tolerated this for years.  He is also on metoprolol and could not be switched to any other agent including propranolol for anxiety.  He called in stating that he has been having frequent episodes of chest discomfort and episodes of dyspnea and is concerned about coronary artery disease as his coworker had similar symptoms and was diagnosed with multivessel disease needing bypass surgery.  Chest pain is described as tightness to heaviness retrosternal pain, sometimes on the left side of the chest, feels like it radiates to his neck, to his back.  Episodes are brought on by stress and sometimes with exertion activity at other times even with exertion activity he does not seem to have any chest pain.  He has been concerned about persistent symptoms sometimes lasting for 20 to 25 minutes.  He also profoundly sweats during exertion activity and that is fairly common for him and hence does not know whether chest pain associated diaphoresis is abnormal or not.  Past Medical History:  Diagnosis Date  . Anxiety   . Depression   . Hypertension   . Mild hyperlipidemia   . Pneumonia    Past Surgical History:  Procedure Laterality Date  . APPENDECTOMY    . CYSTECTOMY  2008   cyst removal from left armpit   Social History   Socioeconomic History  . Marital status: Married    Spouse name: Not on file  . Number of children: 2  . Years of education: Not on file  . Highest education level: Not on file  Occupational History  . Not on file  Social Needs  .  Financial resource strain: Not on file  . Food insecurity    Worry: Not on file    Inability: Not on file  . Transportation needs    Medical: Not on file    Non-medical: Not on file  Tobacco Use  . Smoking status: Former Smoker    Packs/day: 0.50    Years: 24.00    Pack years: 12.00    Quit date: 2013    Years since quitting: 7.7  . Smokeless tobacco: Never Used  Substance and Sexual Activity  . Alcohol use: Yes    Comment: occ  . Drug use: No  . Sexual activity: Not on file  Lifestyle  . Physical activity    Days per week: Not on file    Minutes per session: Not on file  . Stress: Not on file  Relationships  . Social Musician on phone: Not on file    Gets together: Not on file    Attends religious service: Not on file    Active member of club or organization: Not on file    Attends meetings of clubs or organizations: Not on file    Relationship status: Not on file  . Intimate partner violence    Fear of current or ex partner: Not on file    Emotionally abused: Not on file  Physically abused: Not on file    Forced sexual activity: Not on file  Other Topics Concern  . Not on file  Social History Narrative  . Not on file   ROS  Review of Systems  Constitution: Negative for chills, decreased appetite, malaise/fatigue and weight gain.  Cardiovascular: Positive for chest pain and dyspnea on exertion. Negative for leg swelling and syncope.  Endocrine: Negative for cold intolerance.  Hematologic/Lymphatic: Does not bruise/bleed easily.  Musculoskeletal: Negative for joint swelling.  Gastrointestinal: Negative for abdominal pain, anorexia, change in bowel habit, hematochezia and melena.  Neurological: Negative for headaches and light-headedness.  Psychiatric/Behavioral: Negative for depression and substance abuse. The patient is nervous/anxious.   All other systems reviewed and are negative.  Objective   Vitals with BMI 11/12/2018 09/19/2018 08/01/2018   Height 6\' 2"  6\' 2"  6\' 2"   Weight 259 lbs 10 oz 257 lbs 260 lbs  BMI 33.32 32.98 33.37  Systolic 132 130 102  Diastolic 93 88 80  Pulse 79 86 76    Blood pressure (!) 132/93, pulse 79, height 6\' 2"  (1.88 m), weight 259 lb 9.6 oz (117.8 kg), SpO2 96 %. Body mass index is 33.33 kg/m.   Physical Exam  Constitutional:  He is well-built and mildly obese in no acute distress.  HENT:  Head: Atraumatic.  Eyes: Conjunctivae are normal.  Neck: Neck supple. No JVD present. No thyromegaly present.  Cardiovascular: Normal rate, regular rhythm, normal heart sounds and intact distal pulses. Exam reveals no gallop.  No murmur heard. Pulmonary/Chest: Effort normal and breath sounds normal.  Abdominal: Soft. Bowel sounds are normal.  Musculoskeletal: Normal range of motion.        General: No edema.  Neurological: He is alert.  Skin: Skin is warm and dry.  Psychiatric: He has a normal mood and affect.   Radiology: No results found.  Laboratory examination:   G6PD deficiency 01/25/18: G-6-PD, Quant 4.6 - 13.5 U/g Hb <.3Low    Recent Labs    09/17/18 1042  NA 140  K 4.0  CL 103  CO2 29  GLUCOSE 90  BUN 16  CREATININE 1.00  CALCIUM 9.7  GFRNONAA 92  GFRAA 107   CMP Latest Ref Rng & Units 09/17/2018 07/14/2017 08/10/2012  Glucose 65 - 139 mg/dL 90 725(D) 664(Q)  BUN 7 - 25 mg/dL 16 15 16   Creatinine 0.60 - 1.35 mg/dL 0.34 7.42 5.95  Sodium 135 - 146 mmol/L 140 140 142  Potassium 3.5 - 5.3 mmol/L 4.0 3.2(L) 3.7  Chloride 98 - 110 mmol/L 103 103 104  CO2 20 - 32 mmol/L 29 28 27   Calcium 8.6 - 10.3 mg/dL 9.7 9.3 9.6  Total Protein 6.1 - 8.1 g/dL 7.1 - 6.8  Total Bilirubin 0.2 - 1.2 mg/dL 0.8 - 0.4  Alkaline Phos 39 - 117 U/L - - 75  AST 10 - 40 U/L 19 - 15  ALT 9 - 46 U/L 24 - 18   CBC Latest Ref Rng & Units 09/17/2018 01/25/2018 07/14/2017  WBC 3.8 - 10.8 Thousand/uL 6.2 - 9.2  Hemoglobin 13.2 - 17.1 g/dL 63.8 75.6 43.3  Hematocrit 38.5 - 50.0 % 42.6 - 40.3  Platelets 140 - 400  Thousand/uL 182 - 164   Lipid Panel     Component Value Date/Time   CHOL 136 09/17/2018 1042   TRIG 107 09/17/2018 1042   HDL 53 09/17/2018 1042   CHOLHDL 2.6 09/17/2018 1042   VLDL 24.0 04/18/2011 0904   LDLCALC 64  09/17/2018 1042   HEMOGLOBIN A1C No results found for: HGBA1C, MPG TSH No results for input(s): TSH in the last 8760 hours. Medications and allergies   Allergies  Allergen Reactions  . Penicillins Other (See Comments)    UNKNOWN/childhood      Prior to Admission medications   Medication Sig Start Date End Date Taking? Authorizing Provider  buPROPion (WELLBUTRIN XL) 150 MG 24 hr tablet Take 1 tablet (150 mg total) by mouth 3 (three) times daily. Patient taking differently: Take 300 mg by mouth daily.  09/19/18  Yes Baxley, Luanna Cole, MD  cetirizine (ZYRTEC) 10 MG tablet Take 1 tablet (10 mg total) by mouth daily. Patient taking differently: Take 10 mg by mouth as needed.  09/19/18  Yes Baxley, Luanna Cole, MD  Coenzyme Q10 (COQ10 PO) Take by mouth daily.   Yes [provider]  diazepam (VALIUM) 10 MG tablet TAKE 1/2 TO 1 TABLET BY MOUTH 2-3 TIMES DAILY AS NEEDED Patient taking differently: TAKE 1/2 in the am, 1 TABLET noon, 1/2 night 09/19/18  Yes Baxley, Luanna Cole, MD  lisinopril-hydrochlorothiazide (ZESTORETIC) 20-12.5 MG tablet Take 1 tablet by mouth daily. 09/19/18  Yes Baxley, Luanna Cole, MD  methocarbamol (ROBAXIN) 500 MG tablet TAKE 1 TABLET (500 MG TOTAL) BY MOUTH 2 (TWO) TIMES DAILY AS NEEDED FOR MUSCLE SPASMS. 11/03/18  Yes Baxley, Luanna Cole, MD  metoprolol tartrate (LOPRESSOR) 100 MG tablet Take 1 tablet (100 mg total) by mouth 2 (two) times daily. 09/19/18  Yes Baxley, Luanna Cole, MD  Multiple Vitamins-Minerals (MULTIVITAMIN ADULT PO) Take by mouth daily. Vitamin pack   Yes [provider]  rosuvastatin (CRESTOR) 10 MG tablet Take 1 tablet (10 mg total) by mouth daily. 09/19/18  Yes Baxley, Luanna Cole, MD  amLODipine (NORVASC) 5 MG tablet Take 1 tablet (5 mg total) by  mouth daily. 11/12/18 02/10/19  Yates Decamp, MD     Current Outpatient Medications  Medication Instructions  . amLODipine (NORVASC) 5 mg, Oral, Daily  . buPROPion (WELLBUTRIN XL) 150 mg, Oral, 3 times daily  . cetirizine (ZYRTEC) 10 mg, Oral, Daily  . Coenzyme Q10 (COQ10 PO) Oral, Daily  . diazepam (VALIUM) 10 MG tablet TAKE 1/2 TO 1 TABLET BY MOUTH 2-3 TIMES DAILY AS NEEDED  . lisinopril-hydrochlorothiazide (ZESTORETIC) 20-12.5 MG tablet 1 tablet, Oral, Daily  . methocarbamol (ROBAXIN) 500 mg, Oral, 2 times daily PRN  . metoprolol tartrate (LOPRESSOR) 100 mg, Oral, 2 times daily  . Multiple Vitamins-Minerals (MULTIVITAMIN ADULT PO) Oral, Daily, Vitamin pack   . rosuvastatin (CRESTOR) 10 mg, Oral, Daily    Cardiac Studies:   Coronary calcium score 05/10/2016: On a calcium score of 83.6, 94 percentile for subjects in the same age group. Based on age 23 years. Old granular  disease.   Treadmill exercise stress test 02/24/2017: Indication: Screening for CAD, hypertension The patient exercised on Bruce protocol for 9:44 min. Patient achieved 11.36 METS and reached HR 185 bpm, which is 102 % of maximum age-predicted HR. Stress test terminated due to fatigue. Resting EKG demonstrates Normal sinus rhythm. ST Changes: With peak exercise there was no ST-T changes of ischemia.   Chest Pain: none. BP Response to Exercise: Normal resting BP- appropriate response. Hypertensive at rest 164/102 mm Hg. However in recovery, patient developed severe hypotension with BP 80/60 mm Hg. Took 10 minutes before he recuperated with normal BP and dizziness subsided. No arrhythmias. Rec: Patient's BP response is probably normal but exaggerated response with vasodilatation post exercise. Excellent effort without  chest pain or dyspnea.  Recommend echocardiogram to evaluate LV function.  Echo- 02/28/2017 1. Left ventricle cavity is normal in size. Mild concentric hypertrophy of the left ventricle. Normal global wall motion.  Normal diastolic filling pattern. Calculated EF 59%. 2. Left atrial cavity is mildly dilated. 3. Mild (Grade I) mitral regurgitation. 4. Mild tricuspid regurgitation. No evidence of pulmonary hypertension. 5. IVC is dilated with respiratory variation.  Assessment     ICD-10-CM   1. Other chest pain  R07.89 EKG 12-Lead    CT CORONARY MORPH W/CTA COR W/SCORE W/CA W/CM &/OR WO/CM    CT CORONARY FRACTIONAL FLOW RESERVE DATA PREP    CT CORONARY FRACTIONAL FLOW RESERVE FLUID ANALYSIS  2. Agatston coronary artery calcium score less than 100  R93.1 CT CORONARY MORPH W/CTA COR W/SCORE W/CA W/CM &/OR WO/CM    CT CORONARY FRACTIONAL FLOW RESERVE DATA PREP    CT CORONARY FRACTIONAL FLOW RESERVE FLUID ANALYSIS  3. Essential hypertension  I10 amLODipine (NORVASC) 5 MG tablet    Basic metabolic panel  4. Mild hyperlipidemia  E78.5 CT CORONARY MORPH W/CTA COR W/SCORE W/CA W/CM &/OR WO/CM    CT CORONARY FRACTIONAL FLOW RESERVE DATA PREP    CT CORONARY FRACTIONAL FLOW RESERVE FLUID ANALYSIS    EKG 11/12/2018: Normal sinus rhythm with rate of 72 bpm, normal axis, IVCD, borderline criteria for LVH.  No evidence of ischemia.  Recommendations:   Patient with hypertension, hyperlipidemia presenting with chest pain at most appears atypical for angina pectoris.  I reviewed his labs, lipids in the note are under excellent control, blood pressure is elevated today, both for possible angina and hypertension, I have added amlodipine 5 mg daily along with continued metoprolol and lisinopril HCT.  Best option in view of coronary calcification and his cardiac risk factors is to proceed with coronary CTA with possible FFR if intermediate lesions were to be found.  Patient is willing to go through with this.  I would like to see him back in 4 weeks for follow-up.  Weight loss was discussed with the patient.  Yates Decamp, MD, [facility] 11/12/2018, 6:05 PM [facility] Pager: [phone] Office:  [phone] If no answer Cell [phone]  CC: Sharlet Salina, MD; [name], MD (Patient sees both of them).

## 2018-11-28 DIAGNOSIS — I10 Essential (primary) hypertension: Secondary | ICD-10-CM | POA: Diagnosis not present

## 2018-11-29 LAB — BASIC METABOLIC PANEL
BUN/Creatinine Ratio: 10 (ref 9–20)
BUN: 10 mg/dL (ref 6–24)
CO2: 25 mmol/L (ref 20–29)
Calcium: 9.2 mg/dL (ref 8.7–10.2)
Chloride: 102 mmol/L (ref 96–106)
Creatinine, Ser: 1 mg/dL (ref 0.76–1.27)
GFR calc Af Amer: 107 mL/min/{1.73_m2} (ref >59)
GFR calc non Af Amer: 92 mL/min/{1.73_m2} (ref >59)
Glucose: 134 mg/dL — ABNORMAL HIGH (ref 65–99)
Potassium: 4 mmol/L (ref 3.5–5.2)
Sodium: 140 mmol/L (ref 134–144)

## 2018-12-06 ENCOUNTER — Telehealth (HOSPITAL_COMMUNITY): Payer: Self-pay | Admitting: Emergency Medicine

## 2018-12-06 NOTE — Telephone Encounter (Signed)
Left message on voicemail with name and callback number [name] RN Navigator Cardiac Imaging [facility] [phone] Office [phone] Cell  

## 2018-12-06 NOTE — Telephone Encounter (Signed)
Reaching out to patient to offer assistance regarding upcoming cardiac imaging study; pt verbalizes understanding of appt date/time, parking situation and where to check in, pre-test NPO status and medications ordered, and verified current allergies; name and call back number provided for further questions should they arise Jaxsin Bottomley RN Navigator Cardiac Imaging [facility] [phone] office [phone] cell 

## 2018-12-07 ENCOUNTER — Ambulatory Visit (HOSPITAL_COMMUNITY)
Admission: RE | Admit: 2018-12-07 | Discharge: 2018-12-07 | Disposition: A | Discharge status: Home / Self Care | Payer: BC Managed Care – PPO | Source: Ambulatory Visit | Attending: Cardiology | Admitting: Cardiology

## 2018-12-07 ENCOUNTER — Other Ambulatory Visit: Payer: Self-pay

## 2018-12-07 DIAGNOSIS — R0789 Other chest pain: Secondary | ICD-10-CM | POA: Diagnosis not present

## 2018-12-07 DIAGNOSIS — R931 Abnormal findings on diagnostic imaging of heart and coronary circulation: Secondary | ICD-10-CM | POA: Diagnosis not present

## 2018-12-07 DIAGNOSIS — E785 Hyperlipidemia, unspecified: Secondary | ICD-10-CM | POA: Diagnosis not present

## 2018-12-07 MED ORDER — NITROGLYCERIN 0.4 MG SL SUBL
0.8000 mg | SUBLINGUAL_TABLET | SUBLINGUAL | Status: DC | PRN
  Administered 2018-12-07: 10:00:00 0.8 mg via SUBLINGUAL

## 2018-12-07 MED ORDER — IOHEXOL 350 MG/ML SOLN
80.0000 mL | Freq: Once | INTRAVENOUS | Status: AC | PRN
  Administered 2018-12-07: 80 mL via INTRAVENOUS

## 2018-12-07 MED ORDER — NITROGLYCERIN 0.4 MG SL SUBL
SUBLINGUAL_TABLET | SUBLINGUAL | Status: AC
  Filled 2018-12-07: qty 2

## 2018-12-08 ENCOUNTER — Emergency Department (HOSPITAL_BASED_OUTPATIENT_CLINIC_OR_DEPARTMENT_OTHER)
Admission: EM | Admit: 2018-12-08 | Discharge: 2018-12-08 | Disposition: A | Discharge status: Home / Self Care | Payer: BC Managed Care – PPO | Attending: Emergency Medicine | Admitting: Emergency Medicine

## 2018-12-08 ENCOUNTER — Other Ambulatory Visit: Payer: Self-pay

## 2018-12-08 DIAGNOSIS — Y999 Unspecified external cause status: Secondary | ICD-10-CM | POA: Insufficient documentation

## 2018-12-08 DIAGNOSIS — I10 Essential (primary) hypertension: Secondary | ICD-10-CM | POA: Diagnosis not present

## 2018-12-08 DIAGNOSIS — Y9289 Other specified places as the place of occurrence of the external cause: Secondary | ICD-10-CM | POA: Insufficient documentation

## 2018-12-08 DIAGNOSIS — S29012A Strain of muscle and tendon of back wall of thorax, initial encounter: Secondary | ICD-10-CM | POA: Insufficient documentation

## 2018-12-08 DIAGNOSIS — S46811A Strain of other muscles, fascia and tendons at shoulder and upper arm level, right arm, initial encounter: Secondary | ICD-10-CM | POA: Diagnosis not present

## 2018-12-08 DIAGNOSIS — Y9389 Activity, other specified: Secondary | ICD-10-CM | POA: Diagnosis not present

## 2018-12-08 DIAGNOSIS — X503XXA Overexertion from repetitive movements, initial encounter: Secondary | ICD-10-CM | POA: Insufficient documentation

## 2018-12-08 DIAGNOSIS — Z87891 Personal history of nicotine dependence: Secondary | ICD-10-CM | POA: Diagnosis not present

## 2018-12-08 DIAGNOSIS — S161XXA Strain of muscle, fascia and tendon at neck level, initial encounter: Secondary | ICD-10-CM | POA: Diagnosis not present

## 2018-12-08 DIAGNOSIS — Z79899 Other long term (current) drug therapy: Secondary | ICD-10-CM | POA: Insufficient documentation

## 2018-12-08 DIAGNOSIS — S199XXA Unspecified injury of neck, initial encounter: Secondary | ICD-10-CM | POA: Diagnosis not present

## 2018-12-08 MED ORDER — CYCLOBENZAPRINE HCL 5 MG PO TABS: 5.0000 mg | ORAL_TABLET | Freq: Two times a day (BID) | ORAL | 0 refills | Status: DC | PRN

## 2018-12-08 MED ORDER — LIDOCAINE 5 % EX PTCH: 1.0000 | MEDICATED_PATCH | CUTANEOUS | 0 refills | Status: DC

## 2018-12-08 MED ORDER — DEXAMETHASONE SODIUM PHOSPHATE 10 MG/ML IJ SOLN
10.0000 mg | Freq: Once | INTRAMUSCULAR | Status: AC
  Administered 2018-12-08: 10 mg via INTRAMUSCULAR
  Filled 2018-12-08: qty 1

## 2018-12-08 MED ORDER — PREDNISONE 10 MG (21) PO TBPK: ORAL_TABLET | Freq: Every day | ORAL | 0 refills | Status: DC

## 2018-12-08 MED ORDER — KETOROLAC TROMETHAMINE 15 MG/ML IJ SOLN
15.0000 mg | Freq: Once | INTRAMUSCULAR | Status: AC
  Administered 2018-12-08: 15 mg via INTRAMUSCULAR
  Filled 2018-12-08: qty 1

## 2018-12-08 NOTE — Discharge Instructions (Addendum)
Take prednisone as prescribed.  Do not take other anti-inflammatories at the same time (Advil, Motrin, ibuprofen, Aleve). You may supplement with Tylenol if you need further pain control. Use flexeril as needed for muscle stiffness or soreness. Have caution, as this may make you tired or groggy. Do not drive or operate heavy machinery while taking this medication.  Use muscle creams (bengay, icy hot, salonpas) as needed for pain.  Use lidoderm patches as needed for pain. Follow up with the orthopedic doctor if pain is not improving with this treatment.  Return to the ER if you develop high fevers, numbness, you are dropping things with your hand, your hand changes colors, or with any new, worsening, or concerning symptoms.

## 2018-12-08 NOTE — ED Triage Notes (Signed)
Pt c/o neck pain with right side shoulder and arm pain x 6 days. Pt denies injury. NAD, normal gait. Pt reports 2 robaxin at 0730 with no relief. Pt denies CP, denies dizziness

## 2018-12-08 NOTE — ED Provider Notes (Signed)
[facility] DEPART[facility] Provider Note   CSN: [id number] Arrival date & time: 12/08/18  1214     History   Chief Complaint Chief Complaint  Patient presents with   Neck Pain    HPI Paul Freeman is a 43 y.o. male presenting for evaluation of right shoulder pain.  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 He states he had a lot of quick changes form fast to stop and vice versa. 2 days later he developed midline neck pain. Neck pain has since resolved, but he has been having persistent pain in his R shoulder and down his R arm. initially he had tingling and pain all the way down to his fingers, but pain is now only in his shoulder and upper arm. He has tried a dose of Tylenol, ibuprofen, naproxen, and Robaxin without improvement of symptoms.  He denies fall, trauma, or injury.  He reports increased pain when he looks down towards the floor.  Pain is improved when he rests his hand on his head and relaxes in the shoulder.  He denies previous injury or problems with his right shoulder.  He denies numbness or tingling.  He is not dropping things with his hand.     HPI  Past Medical History:  Diagnosis Date   Anxiety    Depression    Hypertension    Mild hyperlipidemia    Pneumonia     Patient Active Problem List   Diagnosis Date Noted   Situational stress 09/19/2018   Neck pain 09/19/2018   G6PD deficiency 09/19/2018   Non-restorative sleep 01/03/2018   Other chronic pain 01/03/2018   Retrognathia 01/03/2018   Loud snoring 01/03/2018   Myalgia 01/03/2018   Chronic fatigue 01/03/2018   Anxiety    Depression     Past Surgical History:  Procedure Laterality Date   APPENDECTOMY     CYSTECTOMY  2008   cyst removal from left armpit        Home Medications    Prior to Admission medications   Medication Sig Start Date End Date Taking? Authorizing Provider    amLODipine (NORVASC) 5 MG tablet Take 1 tablet (5 mg total) by mouth daily. 11/12/18 02/10/19  Adrian Prows, MD  buPROPion (WELLBUTRIN XL) 150 MG 24 hr tablet Take 1 tablet (150 mg total) by mouth 3 (three) times daily. Patient taking differently: Take 300 mg by mouth daily.  09/19/18   Elby Showers, MD  cetirizine (ZYRTEC) 10 MG tablet Take 1 tablet (10 mg total) by mouth daily. Patient taking differently: Take 10 mg by mouth as needed.  09/19/18   Elby Showers, MD  Coenzyme Q10 (COQ10 PO) Take by mouth daily.    [provider]  cyclobenzaprine (FLEXERIL) 5 MG tablet Take 1 tablet (5 mg total) by mouth 2 (two) times daily as needed for muscle spasms. 12/08/18   Kaelynn Igo, PA-C  diazepam (VALIUM) 10 MG tablet TAKE 1/2 TO 1 TABLET BY MOUTH 2-3 TIMES DAILY AS NEEDED Patient taking differently: TAKE 1/2 in the am, 1 TABLET noon, 1/2 night 09/19/18   Elby Showers, MD  lidocaine (LIDODERM) 5 % Place 1 patch onto the skin daily. Remove & Discard patch within 12 hours or as directed by MD 12/08/18   Dellia Donnelly, PA-C  lisinopril-hydrochlorothiazide (ZESTORETIC) 20-12.5 MG tablet Take 1 tablet by mouth daily. 09/19/18   Elby Showers,  MD  methocarbamol (ROBAXIN) 500 MG tablet TAKE 1 TABLET (500 MG TOTAL) BY MOUTH 2 (TWO) TIMES DAILY AS NEEDED FOR MUSCLE SPASMS. 11/03/18   Elby Showers, MD  metoprolol tartrate (LOPRESSOR) 100 MG tablet Take 1 tablet (100 mg total) by mouth 2 (two) times daily. 09/19/18   Elby Showers, MD  Multiple Vitamins-Minerals (MULTIVITAMIN ADULT PO) Take by mouth daily. Vitamin pack    [provider]  predniSONE (STERAPRED UNI-PAK 21 TAB) 10 MG (21) TBPK tablet Take by mouth daily. Take 6 tabs by mouth daily  for 2 days, then 5 tabs for 2 days, then 4 tabs for 2 days, then 3 tabs for 2 days, 2 tabs for 2 days, then 1 tab by mouth daily for 2 days 12/08/18   Harriett Azar, PA-C  rosuvastatin (CRESTOR) 10 MG tablet Take 1 tablet (10 mg total) by  mouth daily. 09/19/18   Elby Showers, MD    Family History Family History  Problem Relation Age of Onset   Breast cancer Mother    Arthritis Mother    Hypertension Father    Atrial fibrillation Father    Prostate cancer Father    Multiple sclerosis Brother    Colon cancer Neg Hx     Social History Social History   Tobacco Use   Smoking status: Former Smoker    Packs/day: 0.50    Years: 24.00    Pack years: 12.00    Quit date: 2013    Years since quitting: 7.8   Smokeless tobacco: Never Used  Substance Use Topics   Alcohol use: Yes    Comment: occ   Drug use: No     Allergies   Penicillins   Review of Systems Review of Systems  Musculoskeletal: Positive for arthralgias.  Neurological: Negative for numbness.     Physical Exam Updated Vital Signs BP 122/87 (BP Location: Left Arm)    Pulse 76    Temp 98.6 F (37 C) (Oral)    Resp 18    Ht 6\' 2"  (1.88 m)    Wt 113.4 kg    SpO2 97%    BMI 32.10 kg/m   Physical Exam Vitals signs and nursing note reviewed.  Constitutional:      General: He is not in acute distress.    Appearance: He is well-developed.     Comments: Resting comfortably in the bed in no acute distress  HENT:     Head: Normocephalic and atraumatic.  Eyes:     Conjunctiva/sclera: Conjunctivae normal.     Pupils: Pupils are equal, round, and reactive to light.  Neck:     Musculoskeletal: Normal range of motion and neck supple.     Comments: No pain over midline spine.  No step-offs or deformities.  Increased pain when looking towards the floor, no pain when turning head from side to side. Cardiovascular:     Rate and Rhythm: Normal rate and regular rhythm.     Pulses: Normal pulses.  Pulmonary:     Effort: Pulmonary effort is normal. No respiratory distress.     Breath sounds: Normal breath sounds. No wheezing.  Abdominal:     General: There is no distension.     Palpations: Abdomen is soft. There is no mass.     Tenderness:  There is no abdominal tenderness. There is no guarding or rebound.  Musculoskeletal: Normal range of motion.       Back:     Comments: Tenderness to palpation  along the right trapezius muscle.  No erythema, warmth, or swelling.  Grip strength intact bilaterally.  Radial pulses equal bilaterally.  Strength of biceps and triceps equal.  Full active range of motion of the shoulder with pain, although no pain once arm is raised.  Distal sensation intact.  Skin:    General: Skin is warm and dry.     Capillary Refill: Capillary refill takes less than 2 seconds.  Neurological:     Mental Status: He is alert and oriented to person, place, and time.     Sensory: No sensory deficit.     Motor: No weakness.      ED Treatments / Results  Labs (all labs ordered are listed, but only abnormal results are displayed) Labs Reviewed - No data to display  EKG None  Radiology Ct Coronary Morph W/cta Cor W/score W/ca W/cm &/or Wo/cm  Addendum Date: 12/07/2018   ADDENDUM REPORT: 12/07/2018 16:53 CLINICAL DATA:  Chest pain EXAM: Cardiac CTA MEDICATIONS: Sub lingual nitro. 4mg  x 2 TECHNIQUE: The patient was scanned on a Siemens [id number] slice scanner. Gantry rotation speed was 250 msecs. Collimation was 0.6 mm. A 100 kV prospective scan was triggered in the ascending thoracic aorta at 35-75% of the R-R interval. Average HR during the scan was 60 bpm. The 3D data set was interpreted on a dedicated work station using MPR, MIP and VRT modes. A total of 80cc of contrast was used. FINDINGS: Non-cardiac: See separate report from [facility]. Pulmonary veins drain normally to the left atrium. Calcium Score: 109 Agatston units. Coronary Arteries: Right dominant with no anomalies LM: No plaque or stenosis. LAD system: Soft plaque ostial LAD, mild (<50%) stenosis. Mixed plaque proximal LAD, mild (<50%) stenosis. Circumflex system: No plaque or stenosis. RCA system: No plaque or stenosis. IMPRESSION: 1. Coronary artery  calcium score 109 Agatston units. This places the patient in the 95th percentile for age and gender, suggesting high risk for future cardiac events. 2. Probably mild ostial and proximal LAD stenosis. Will confirm with FFR. Dalton Mclean Electronically Signed   By: Loralie Champagne M.D.   On: 12/07/2018 16:53   Result Date: 12/07/2018 EXAM: OVER-READ INTERPRETATION  CT CHEST The following report is an over-read performed by radiologist Dr. Rolm Baptise of [facility], [place] on 12/07/2018. This over-read does not include interpretation of cardiac or coronary anatomy or pathology. The coronary CTA interpretation by the cardiologist is attached. COMPARISON:  05/10/2016 FINDINGS: Vascular: Heart is normal size.  Visualized aorta is normal caliber. Mediastinum/Nodes: No adenopathy in the lower mediastinum or hila. Lungs/Pleura: Calcified granuloma at the right lung base. Otherwise, visualized lungs clear. No effusions. Upper Abdomen: Imaging into the upper abdomen shows no acute findings. Musculoskeletal: Chest wall soft tissues are unremarkable. No acute bony abnormality. IMPRESSION: No acute or significant extracardiac abnormality. Electronically Signed: By: Rolm Baptise M.D. On: 12/07/2018 10:12    Procedures Procedures (including critical care time)  Medications Ordered in ED Medications  dexamethasone (DECADRON) injection 10 mg (10 mg Intramuscular Given 12/08/18 1314)  ketorolac (TORADOL) 15 MG/ML injection 15 mg (15 mg Intramuscular Given 12/08/18 1314)     Initial Impression / Assessment and Plan / ED Course  I have reviewed the triage vital signs and the nursing notes.  Pertinent labs & imaging results that were available during my care of the patient were reviewed by me and considered in my medical decision making (see chart for details).        Patient presenting for  evaluation of right shoulder pain.  Physical exam reassuring, he is neurovascularly intact.  Good pulses, doubt  arterial emergency.  No swelling or erythema to indicate DVT.  No fall, trauma, injury, doubt fracture dislocation.  I do not believe he would benefit from x-rays.  No weakness to indicate myelopathy or radiculopathy.  I do not believe he needs emergent CT or MRI at this time.  Likely trapezius strain/inflammation.  As patient has tried his home Robaxin without improvement, will switch to Flexeril.  Prednisone taper for inflammatory relief.  Lidoderm patch to help with pain.  Encourage follow-up with Ortho if symptoms not improving.  At this time, patient appears safe for discharge.  Return precautions given.  Patient states he understands and agrees to plan.   Final Clinical Impressions(s) / ED Diagnoses   Final diagnoses:  Strain of right trapezius muscle, initial encounter  Acute strain of neck muscle, initial encounter    ED Discharge Orders         Ordered    predniSONE (STERAPRED UNI-PAK 21 TAB) 10 MG (21) TBPK tablet  Daily     12/08/18 1258    cyclobenzaprine (FLEXERIL) 5 MG tablet  2 times daily PRN     12/08/18 1258    lidocaine (LIDODERM) 5 %  Every 24 hours     12/08/18 1258           Diamonique Ruedas, PA-C 12/08/18 Hume, Dan, DO 12/09/18 0700

## 2018-12-09 DIAGNOSIS — R0789 Other chest pain: Secondary | ICD-10-CM | POA: Diagnosis not present

## 2018-12-12 ENCOUNTER — Emergency Department (HOSPITAL_BASED_OUTPATIENT_CLINIC_OR_DEPARTMENT_OTHER)
Admission: EM | Admit: 2018-12-12 | Discharge: 2018-12-12 | Disposition: A | Discharge status: Home / Self Care | Payer: BC Managed Care – PPO | Attending: Emergency Medicine | Admitting: Emergency Medicine

## 2018-12-12 ENCOUNTER — Other Ambulatory Visit: Payer: Self-pay

## 2018-12-12 ENCOUNTER — Emergency Department (HOSPITAL_BASED_OUTPATIENT_CLINIC_OR_DEPARTMENT_OTHER): Payer: BC Managed Care – PPO

## 2018-12-12 ENCOUNTER — Encounter (HOSPITAL_BASED_OUTPATIENT_CLINIC_OR_DEPARTMENT_OTHER): Payer: Self-pay | Admitting: Student

## 2018-12-12 DIAGNOSIS — M542 Cervicalgia: Secondary | ICD-10-CM | POA: Insufficient documentation

## 2018-12-12 DIAGNOSIS — R202 Paresthesia of skin: Secondary | ICD-10-CM | POA: Diagnosis not present

## 2018-12-12 DIAGNOSIS — I10 Essential (primary) hypertension: Secondary | ICD-10-CM | POA: Diagnosis not present

## 2018-12-12 DIAGNOSIS — Z87891 Personal history of nicotine dependence: Secondary | ICD-10-CM | POA: Diagnosis not present

## 2018-12-12 DIAGNOSIS — M25511 Pain in right shoulder: Secondary | ICD-10-CM | POA: Diagnosis not present

## 2018-12-12 DIAGNOSIS — Z79899 Other long term (current) drug therapy: Secondary | ICD-10-CM | POA: Diagnosis not present

## 2018-12-12 MED ORDER — KETOROLAC TROMETHAMINE 60 MG/2ML IM SOLN
30.0000 mg | Freq: Once | INTRAMUSCULAR | Status: AC
  Administered 2018-12-12: 12:00:00 30 mg via INTRAMUSCULAR
  Filled 2018-12-12: qty 2

## 2018-12-12 NOTE — ED Provider Notes (Signed)
[facility]ERG[facility] DEPARTMENT Provider Note   CSN: [id number] Arrival date & time: 12/12/18  0941     History   Chief Complaint Chief Complaint  Patient presents with  . Neck Pain    HPI Paul Freeman is a 43 y.o. male with a hx of anxiety, depression, HTN, hyperlipidemia, & G6PD deficiency who returns to the ER for persistent neck/RUE pain since last visit 12/08/18. 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 Initially pain was midline then seemed more to the R side with radiating into the RUE. Has intermittent R hand paresthesias to the tips of the digits- not persistent. Denies complete numbness or weakness, he is not dropping things or unable to utilize the RUE. Seen in the ED 10/31 for sxs, given IM toradol & decadron- felt much better & back to baseline a few hours after visit but then woke up the next morning with return of sxs. He was discharged w/ flexeril, lidoderm patches, & prednisone taper. It appears he has not been taking steroids as prescribed, he has only had 1 dose on Monday 11/2 as he thought that he was suppose to be taking these medicines every other day. He has not followed up with sports medicine as provided in his DC instructions. Denies saddle anesthesia, incontinence to bowel/bladder, fever, chills, IV drug use, dysuria, or hx of cancer. Patient has not had prior neck surgeries. Denies direct trauma.   HPI  Past Medical History:  Diagnosis Date  . Anxiety   . Depression   . Hypertension   . Mild hyperlipidemia   . Pneumonia     Patient Active Problem List   Diagnosis Date Noted  . Situational stress 09/19/2018  . Neck pain 09/19/2018  . G6PD deficiency 09/19/2018  . Non-restorative sleep 01/03/2018  . Other chronic pain 01/03/2018  . Retrognathia  01/03/2018  . Loud snoring 01/03/2018  . Myalgia 01/03/2018  . Chronic fatigue 01/03/2018  . Anxiety   . Depression     Past Surgical History:  Procedure Laterality Date  . APPENDECTOMY    . CYSTECTOMY  2008   cyst removal from left armpit        Home Medications    Prior to Admission medications   Medication Sig Start Date End Date Taking? Authorizing Provider  amLODipine (NORVASC) 5 MG tablet Take 1 tablet (5 mg total) by mouth daily. 11/12/18 02/10/19  Adrian Prows, MD  buPROPion (WELLBUTRIN XL) 150 MG 24 hr tablet Take 1 tablet (150 mg total) by mouth 3 (three) times daily. Patient taking differently: Take 300 mg by mouth daily.  09/19/18   Elby Showers, MD  cetirizine (ZYRTEC) 10 MG tablet Take 1 tablet (10 mg total) by mouth daily. Patient taking differently: Take 10 mg by mouth as needed.  09/19/18   Elby Showers, MD  Coenzyme Q10 (COQ10 PO) Take by mouth daily.    [provider]  cyclobenzaprine (FLEXERIL) 5 MG tablet Take 1 tablet (5 mg total) by mouth 2 (two) times daily as needed for muscle spasms. 12/08/18   Caccavale, Sophia, PA-C  diazepam (VALIUM) 10 MG tablet TAKE 1/2 TO 1 TABLET BY MOUTH 2-3 TIMES DAILY AS NEEDED Patient taking differently: TAKE 1/2 in the am, 1 TABLET noon, 1/2 night 09/19/18   Baxley, Cresenciano Lick, MD  lidocaine (  LIDODERM) 5 % Place 1 patch onto the skin daily. Remove & Discard patch within 12 hours or as directed by MD 12/08/18   Caccavale, Sophia, PA-C  lisinopril-hydrochlorothiazide (ZESTORETIC) 20-12.5 MG tablet Take 1 tablet by mouth daily. 09/19/18   Elby Showers, MD  methocarbamol (ROBAXIN) 500 MG tablet TAKE 1 TABLET (500 MG TOTAL) BY MOUTH 2 (TWO) TIMES DAILY AS NEEDED FOR MUSCLE SPASMS. 11/03/18   Elby Showers, MD  metoprolol tartrate (LOPRESSOR) 100 MG tablet Take 1 tablet (100 mg total) by mouth 2 (two) times daily. 09/19/18   Elby Showers, MD  Multiple Vitamins-Minerals (MULTIVITAMIN ADULT PO) Take by mouth daily. Vitamin pack     [provider]  predniSONE (STERAPRED UNI-PAK 21 TAB) 10 MG (21) TBPK tablet Take by mouth daily. Take 6 tabs by mouth daily  for 2 days, then 5 tabs for 2 days, then 4 tabs for 2 days, then 3 tabs for 2 days, 2 tabs for 2 days, then 1 tab by mouth daily for 2 days 12/08/18   Caccavale, Sophia, PA-C  rosuvastatin (CRESTOR) 10 MG tablet Take 1 tablet (10 mg total) by mouth daily. 09/19/18   Elby Showers, MD    Family History Family History  Problem Relation Age of Onset  . Breast cancer Mother   . Arthritis Mother   . Hypertension Father   . Atrial fibrillation Father   . Prostate cancer Father   . Multiple sclerosis Brother   . Colon cancer Neg Hx     Social History Social History   Tobacco Use  . Smoking status: Former Smoker    Packs/day: 0.50    Years: 24.00    Pack years: 12.00    Quit date: 2013    Years since quitting: 7.8  . Smokeless tobacco: Never Used  Substance Use Topics  . Alcohol use: Yes    Comment: occ  . Drug use: No     Allergies   Penicillins   Review of Systems Review of Systems  Constitutional: Negative for chills, fever and unexpected weight change.  Gastrointestinal: Negative for abdominal pain, nausea and vomiting.  Genitourinary: Negative for dysuria.  Musculoskeletal: Positive for arthralgias and neck pain.  Neurological: Negative for weakness and numbness.       Positive for intermittent R finger tip paresthesias. Negative for saddle anesthesia or bowel/bladder incontinence.    Physical Exam Updated Vital Signs There were no vitals taken for this visit.  Physical Exam Vitals signs and nursing note reviewed.  Constitutional:      General: He is not in acute distress.    Appearance: Normal appearance. He is not ill-appearing or toxic-appearing.  HENT:     Head: Normocephalic and atraumatic.  Neck:     Musculoskeletal: Neck supple.     Comments: Patient has some tenderness to the lower cervical spine midline but no step  off. Tender to palpation most primarily to the R trapezius region. Negative Spurling.  Cardiovascular:     Rate and Rhythm: Normal rate and regular rhythm.     Pulses: Normal pulses.          Radial pulses are 2+ on the right side and 2+ on the left side.  Pulmonary:     Effort: Pulmonary effort is normal. No respiratory distress.     Breath sounds: Normal breath sounds.  Musculoskeletal:     Comments: Upper extremities: No obvious deformity, appreciable swelling, edema, erythema, ecchymosis, warmth, or open wounds. Patient has intact  AROM throughout. No point/focal bony tenderness.   Skin:    General: Skin is warm and dry.     Capillary Refill: Capillary refill takes less than 2 seconds.  Neurological:     Mental Status: He is alert.     Comments: Alert. Clear speech. Sensation grossly intact to bilateral upper extremities. 5/5 symmetric strength with shoulder flexion/extension, elbow flexion/extension, & grip strength. Able to perform OK sign, thumbs up, & cross 2nd/3rd digits bilaterally. Ambulatory.   Psychiatric:        Mood and Affect: Mood normal.        Behavior: Behavior normal.    ED Treatments / Results  Labs (all labs ordered are listed, but only abnormal results are displayed) Labs Reviewed - No data to display  EKG None  Radiology Dg Shoulder Right  Result Date: 12/12/2018 CLINICAL DATA:  Right upper extremity pain and neck pain. Symptoms for 1 week. No trauma history EXAM: RIGHT SHOULDER - 2+ VIEW COMPARISON:  None. FINDINGS: There is no evidence of fracture or dislocation. There is no evidence of arthropathy or other focal bone abnormality. Soft tissues are unremarkable. IMPRESSION: Negative. Electronically Signed   By: Monte Fantasia M.D.   On: 12/12/2018 11:20   Ct Cervical Spine Wo Contrast  Result Date: 12/12/2018 CLINICAL DATA:  Neck pain with right fingers tingling. EXAM: CT CERVICAL SPINE WITHOUT CONTRAST TECHNIQUE: Multidetector CT imaging of the cervical  spine was performed without intravenous contrast. Multiplanar CT image reconstructions were also generated. COMPARISON:  None. FINDINGS: Alignment: Normal. Skull base and vertebrae: No acute fracture. No primary bone lesion or focal pathologic process. Soft tissues and spinal canal: No prevertebral fluid or swelling. No visible canal hematoma. Disc levels: Mild degenerative changes throughout the cervical spine greatest at C5-C6 and C6-C7. Mild canal narrowing is suggested at C5-6 and C6-7. Also with potential neural foraminal narrowing at C7-T1. Upper chest: Negative. Other: None. IMPRESSION: Mild degenerative changes. No fracture or traumatic malalignment. Suggestion of mild neural foraminal narrowing at C7-T1 bilaterally. MRI may be helpful given symptoms to assess for disc related changes that could explain symptoms. Electronically Signed   By: Zetta Bills M.D.   On: 12/12/2018 11:29    Procedures Procedures (including critical care time)  Medications Ordered in ED Medications  ketorolac (TORADOL) injection 30 mg (has no administration in time range)     Initial Impression / Assessment and Plan / ED Course  I have reviewed the triage vital signs and the nursing notes.  Pertinent labs & imaging results that were available during my care of the patient were reviewed by me and considered in my medical decision making (see chart for details).   Patient returns to the ER with complaints of persistent R sided neck pain & intermittent R digit paresthesias. Nontoxic appearing, vitals WNL. No fever or IVDU to raise concern for infectious process. Imaging obtained:  -Shoulder xray- negative.  -Cervical CT-  Mild degenerative changes. No fracture or traumatic malalignment. Suggestion of mild neural foraminal narrowing at C7-T1 bilaterally. MRI may be helpful given symptoms to assess for disc related changes that could explain symptoms  --> Patient with subjective intermittent paresthesias to finger  tips, no persistent paresthesias, no objective neurologic deficits on exam. Do not feel emergent MRI is necessary at this time, would like benefit as outpatient should sxs persist. Will give IM toradol in the ER and have patient take his steroids as previously prescribed. Neurosurgery follow up provided. I discussed results, treatment plan, need  for follow-up, and return precautions with the patient. Provided opportunity for questions, patient confirmed understanding and is in agreement with plan.   Findings and plan of care discussed with supervising physician Dr. Sedonia Small who is in agreement.   Final Clinical Impressions(s) / ED Diagnoses   Final diagnoses:  Neck pain    ED Discharge Orders    None       Amaryllis Dyke, PA-C 12/12/18 1150    Maudie Flakes, MD 12/13/18 1500

## 2018-12-12 NOTE — Discharge Instructions (Signed)
You were seen in the ER today for neck pain and right hand tingling coming and going.   Your shoulder xray was normal Your neck CT scan showed some degenerative/narrowing changes that could benefit from MRI to be further assessed.   We would like you to take the steroids you were prescribed daily per the prescription. Continue additional medicines as previously prescribed. Apply heating pad to the right shoulder/neck area.   Please follow up with neurosurgery within 3 days for re-evaluation.  Return to the ER for new or worsening symptoms including but not limited to worsened pain, persistent tingling, numbness, weakness, dropping things, or any other concerns.

## 2018-12-12 NOTE — ED Triage Notes (Signed)
Neck pain x 1 week. Pain radiates into R arm and causes tingling to fingers. Was seen here and given steroids and pain medication. He got relief with these but now symptoms have returned.

## 2018-12-13 ENCOUNTER — Telehealth: Payer: Self-pay

## 2018-12-13 ENCOUNTER — Ambulatory Visit: Payer: BC Managed Care – PPO | Admitting: Cardiology

## 2018-12-13 NOTE — Progress Notes (Deleted)
Primary Physician/Referring:  Margaree Mackintosh, MD  Patient ID: Arvil Persons, male    DOB: [birth year], 43 y.o.   MRN: [id number]  No chief complaint on file.  HPI:    ALHASSANE SANSON  is a 43 y.o. Caucasian male with hypertension, extreme anxiety, mild  G6PD deficiency, hypertension, HCT utilized in spite of G6PD deficiency at a low dose.  He has tolerated this for years.  He is also on metoprolol and could not be switched to any other agent including propranolol for anxiety. He was seen 4 weeks ago for atypical chest pain, underwent coronary CTA on 12/11/2018 and no presents for follow-up.  Past Medical History:  Diagnosis Date   Anxiety    Depression    Hypertension    Mild hyperlipidemia    Pneumonia    Past Surgical History:  Procedure Laterality Date   APPENDECTOMY     CYSTECTOMY  2008   cyst removal from left armpit   Social History   Socioeconomic History   Marital status: Married    Spouse name: Not on file   Number of children: 2   Years of education: Not on file   Highest education level: Not on file  Occupational History   Not on file  Social Needs   Financial resource strain: Not on file   Food insecurity    Worry: Not on file    Inability: Not on file   Transportation needs    Medical: Not on file    Non-medical: Not on file  Tobacco Use   Smoking status: Former Smoker    Packs/day: 0.50    Years: 24.00    Pack years: 12.00    Quit date: 2013    Years since quitting: 7.8   Smokeless tobacco: Never Used  Substance and Sexual Activity   Alcohol use: Yes    Comment: occ   Drug use: No   Sexual activity: Not on file  Lifestyle   Physical activity    Days per week: Not on file    Minutes per session: Not on file   Stress: Not on file  Relationships   Social connections    Talks on phone: Not on file    Gets together: Not on file    Attends religious service: Not on file    Active member of club or organization: Not on  file    Attends meetings of clubs or organizations: Not on file    Relationship status: Not on file   Intimate partner violence    Fear of current or ex partner: Not on file    Emotionally abused: Not on file    Physically abused: Not on file    Forced sexual activity: Not on file  Other Topics Concern   Not on file  Social History Narrative   Not on file   ROS  Review of Systems  Constitution: Negative for chills, decreased appetite, malaise/fatigue and weight gain.  Cardiovascular: Positive for chest pain and dyspnea on exertion. Negative for leg swelling and syncope.  Endocrine: Negative for cold intolerance.  Hematologic/Lymphatic: Does not bruise/bleed easily.  Musculoskeletal: Negative for joint swelling.  Gastrointestinal: Negative for abdominal pain, anorexia, change in bowel habit, hematochezia and melena.  Neurological: Negative for headaches and light-headedness.  Psychiatric/Behavioral: Negative for depression and substance abuse. The patient is nervous/anxious.   All other systems reviewed and are negative.  Objective   Vitals with BMI 12/12/2018 12/08/2018 12/07/2018  Height - 6\' 2"  -  Weight - 250 lbs -  BMI - 32.08 -  Systolic 113 122 161  Diastolic 74 87 81  Pulse 85 76 -    Physical Exam  Constitutional:  He is well-built and mildly obese in no acute distress.  HENT:  Head: Atraumatic.  Eyes: Conjunctivae are normal.  Neck: Neck supple. No JVD present. No thyromegaly present.  Cardiovascular: Normal rate, regular rhythm, normal heart sounds and intact distal pulses. Exam reveals no gallop.  No murmur heard. Pulmonary/Chest: Effort normal and breath sounds normal.  Abdominal: Soft. Bowel sounds are normal.  Musculoskeletal: Normal range of motion.        General: No edema.  Neurological: He is alert.  Skin: Skin is warm and dry.  Psychiatric: He has a normal mood and affect.    Laboratory examination:   G6PD deficiency 01/25/18: G-6-PD,  Quant 4.6 - 13.5 U/g Hb <.3Low    Recent Labs    09/17/18 1042 11/28/18 0812  NA 140 140  K 4.0 4.0  CL 103 102  CO2 29 25  GLUCOSE 90 134*  BUN 16 10  CREATININE 1.00 1.00  CALCIUM 9.7 9.2  GFRNONAA 92 92  GFRAA 107 107   CMP Latest Ref Rng & Units 11/28/2018 09/17/2018 07/14/2017  Glucose 65 - 99 mg/dL 096(E) 90 454(U)  BUN 6 - 24 mg/dL 10 16 15   Creatinine 0.76 - 1.27 mg/dL 9.81 1.91 4.78  Sodium 134 - 144 mmol/L 140 140 140  Potassium 3.5 - 5.2 mmol/L 4.0 4.0 3.2(L)  Chloride 96 - 106 mmol/L 102 103 103  CO2 20 - 29 mmol/L 25 29 28   Calcium 8.7 - 10.2 mg/dL 9.2 9.7 9.3  Total Protein 6.1 - 8.1 g/dL - 7.1 -  Total Bilirubin 0.2 - 1.2 mg/dL - 0.8 -  Alkaline Phos 39 - 117 U/L - - -  AST 10 - 40 U/L - 19 -  ALT 9 - 46 U/L - 24 -   CBC Latest Ref Rng & Units 09/17/2018 01/25/2018 07/14/2017  WBC 3.8 - 10.8 Thousand/uL 6.2 - 9.2  Hemoglobin 13.2 - 17.1 g/dL 29.5 62.1 30.8  Hematocrit 38.5 - 50.0 % 42.6 - 40.3  Platelets 140 - 400 Thousand/uL 182 - 164   Lipid Panel     Component Value Date/Time   CHOL 136 09/17/2018 1042   TRIG 107 09/17/2018 1042   HDL 53 09/17/2018 1042   CHOLHDL 2.6 09/17/2018 1042   VLDL 24.0 04/18/2011 0904   LDLCALC 64 09/17/2018 1042   HEMOGLOBIN A1C No results found for: HGBA1C, MPG TSH No results for input(s): TSH in the last 8760 hours. Medications and allergies   Allergies  Allergen Reactions   Penicillins Other (See Comments)    UNKNOWN/childhood      Prior to Admission medications   Medication Sig Start Date End Date Taking? Authorizing Provider  buPROPion (WELLBUTRIN XL) 150 MG 24 hr tablet Take 1 tablet (150 mg total) by mouth 3 (three) times daily. Patient taking differently: Take 300 mg by mouth daily.  09/19/18  Yes Baxley, Luanna Cole, MD  cetirizine (ZYRTEC) 10 MG tablet Take 1 tablet (10 mg total) by mouth daily. Patient taking differently: Take 10 mg by mouth as needed.  09/19/18  Yes Baxley, Luanna Cole, MD  Coenzyme Q10 (COQ10  PO) Take by mouth daily.   Yes [provider]  diazepam (VALIUM) 10 MG tablet TAKE 1/2 TO 1 TABLET BY MOUTH 2-3 TIMES DAILY AS NEEDED Patient taking differently: TAKE  1/2 in the am, 1 TABLET noon, 1/2 night 09/19/18  Yes Baxley, Luanna Cole, MD  lisinopril-hydrochlorothiazide (ZESTORETIC) 20-12.5 MG tablet Take 1 tablet by mouth daily. 09/19/18  Yes Baxley, Luanna Cole, MD  methocarbamol (ROBAXIN) 500 MG tablet TAKE 1 TABLET (500 MG TOTAL) BY MOUTH 2 (TWO) TIMES DAILY AS NEEDED FOR MUSCLE SPASMS. 11/03/18  Yes Baxley, Luanna Cole, MD  metoprolol tartrate (LOPRESSOR) 100 MG tablet Take 1 tablet (100 mg total) by mouth 2 (two) times daily. 09/19/18  Yes Baxley, Luanna Cole, MD  Multiple Vitamins-Minerals (MULTIVITAMIN ADULT PO) Take by mouth daily. Vitamin pack   Yes [provider]  rosuvastatin (CRESTOR) 10 MG tablet Take 1 tablet (10 mg total) by mouth daily. 09/19/18  Yes Baxley, Luanna Cole, MD  amLODipine (NORVASC) 5 MG tablet Take 1 tablet (5 mg total) by mouth daily. 11/12/18 02/10/19  Yates Decamp, MD     Current Outpatient Medications  Medication Instructions   amLODipine (NORVASC) 5 mg, Oral, Daily   buPROPion (WELLBUTRIN XL) 150 mg, Oral, 3 times daily   cetirizine (ZYRTEC) 10 mg, Oral, Daily   Coenzyme Q10 (COQ10 PO) Oral, Daily   cyclobenzaprine (FLEXERIL) 5 mg, Oral, 2 times daily PRN   diazepam (VALIUM) 10 MG tablet TAKE 1/2 TO 1 TABLET BY MOUTH 2-3 TIMES DAILY AS NEEDED   lidocaine (LIDODERM) 5 % 1 patch, Transdermal, Every 24 hours, Remove & Discard patch within 12 hours or as directed by MD   lisinopril-hydrochlorothiazide (ZESTORETIC) 20-12.5 MG tablet 1 tablet, Oral, Daily   methocarbamol (ROBAXIN) 500 mg, Oral, 2 times daily PRN   metoprolol tartrate (LOPRESSOR) 100 mg, Oral, 2 times daily   Multiple Vitamins-Minerals (MULTIVITAMIN ADULT PO) Oral, Daily, Vitamin pack    predniSONE (STERAPRED UNI-PAK 21 TAB) 10 MG (21) TBPK tablet Oral, Daily, Take 6 tabs by mouth daily  for 2  days, then 5 tabs for 2 days, then 4 tabs for 2 days, then 3 tabs for 2 days, 2 tabs for 2 days, then 1 tab by mouth daily for 2 days   rosuvastatin (CRESTOR) 10 mg, Oral, Daily    Radiology::   Dg Shoulder Right  Result Date: 12/12/2018 CLINICAL DATA:  Right upper extremity pain and neck pain. Symptoms for 1 week. No trauma history EXAM: RIGHT SHOULDER - 2+ VIEW COMPARISON:  None. FINDINGS: There is no evidence of fracture or dislocation. There is no evidence of arthropathy or other focal bone abnormality. Soft tissues are unremarkable. IMPRESSION: Negative. Electronically Signed   By: Marnee Spring M.D.   On: 12/12/2018 11:20   Ct Cervical Spine Wo Contrast  Result Date: 12/12/2018 CLINICAL DATA:  Neck pain with right fingers tingling. EXAM: CT CERVICAL SPINE WITHOUT CONTRAST TECHNIQUE: Multidetector CT imaging of the cervical spine was performed without intravenous contrast. Multiplanar CT image reconstructions were also generated. COMPARISON:  None. FINDINGS: Alignment: Normal. Skull base and vertebrae: No acute fracture. No primary bone lesion or focal pathologic process. Soft tissues and spinal canal: No prevertebral fluid or swelling. No visible canal hematoma. Disc levels: Mild degenerative changes throughout the cervical spine greatest at C5-C6 and C6-C7. Mild canal narrowing is suggested at C5-6 and C6-7. Also with potential neural foraminal narrowing at C7-T1. Upper chest: Negative. Other: None. IMPRESSION: Mild degenerative changes. No fracture or traumatic malalignment. Suggestion of mild neural foraminal narrowing at C7-T1 bilaterally. MRI may be helpful given symptoms to assess for disc related changes that could explain symptoms. Electronically Signed   By: Jason Fila.D.  On: 12/12/2018 11:29   Cardiac Studies:   Coronary calcium score 05/10/2016: On a calcium score of 83.6, 94 percentile for subjects in the same age group. Based on age 34 years. Old granular  disease.    Treadmill exercise stress test 02/24/2017: Indication: Screening for CAD, hypertension The patient exercised on Bruce protocol for 9:44 min. Patient achieved 11.36 METS and reached HR 185 bpm, which is 102 % of maximum age-predicted HR. Stress test terminated due to fatigue. Resting EKG demonstrates Normal sinus rhythm. ST Changes: With peak exercise there was no ST-T changes of ischemia.   Chest Pain: none. BP Response to Exercise: Normal resting BP- appropriate response. Hypertensive at rest 164/102 mm Hg. However in recovery, patient developed severe hypotension with BP 80/60 mm Hg. Took 10 minutes before he recuperated with normal BP and dizziness subsided. No arrhythmias. Rec: Patient's BP response is probably normal but exaggerated response with vasodilatation post exercise. Excellent effort without chest pain or dyspnea.  Recommend echocardiogram to evaluate LV function.  Echo- 02/28/2017 1. Left ventricle cavity is normal in size. Mild concentric hypertrophy of the left ventricle. Normal global wall motion. Normal diastolic filling pattern. Calculated EF 59%. 2. Left atrial cavity is mildly dilated. 3. Mild (Grade I) mitral regurgitation. 4. Mild tricuspid regurgitation. No evidence of pulmonary hypertension. 5. IVC is dilated with respiratory variation.  Coronary CTA angiogram 12/11/2018: 1. Coronary artery calcium score 109 Agatston units. This places the patient in the 95th percentile for age and gender, suggesting high risk for future cardiac events.  2. Probably mild ostial and proximal LAD stenosis and with FFR, there was no evidence for hemodynamically significant stenosis.  3. No acute or significant extracardiac abnormality.  Assessment     ICD-10-CM   1. Other chest pain  R07.89   2. Agatston CAC score 100-199  R93.1   3. Essential hypertension  I10   4. Mild hyperlipidemia  E78.5     EKG 11/12/2018: Normal sinus rhythm with rate of 72 bpm, normal axis, IVCD, borderline  criteria for LVH.  No evidence of ischemia.  Recommendations:   Patient with hypertension, hyperlipidemia presenting with chest pain at most appears atypical for angina pectoris. Coronary CTA discussed.  I reviewed his labs, lipids in the note are under excellent control, LDL and HDL are at target.  Blood pressure is now controlled with addition of  amlodipine 5 mg daily along with continued metoprolol and lisinopril HCT.  Weight loss was discussed with the patient.  Yates Decamp, MD, [facility] 12/13/2018, 6:38 AM [facility] [place] Pager: [phone] Office: [phone] If no answer Cell [phone]  CC: Sharlet Salina, MD; [name], MD (Patient sees both of them).

## 2018-12-15 NOTE — Telephone Encounter (Signed)
I reviewed his chart. He is on appropriate medications. If he is doing well, I can see him in 6 months

## 2018-12-17 NOTE — Telephone Encounter (Signed)
Forward pre pt

## 2018-12-20 ENCOUNTER — Telehealth: Payer: Self-pay

## 2018-12-20 NOTE — Telephone Encounter (Signed)
OK not necessary but should keep appt with neurosurgeon for evaluation.

## 2018-12-20 NOTE — Telephone Encounter (Signed)
Called patient to schedule an appointment to follow up ED visit, and patient said he has an appointment with the neuro surgeon on 11/20. He said he sleeps on a wedge pillow and the pain is so much better. He said he got two shots of depo medrol at the hospital and he also got a CT scan and he feels better. He said he really wouldn't like to come unless you think is necessary.

## 2018-12-20 NOTE — Telephone Encounter (Signed)
Called and spoke with patient he verbalized understanding.

## 2018-12-28 DIAGNOSIS — M4722 Other spondylosis with radiculopathy, cervical region: Secondary | ICD-10-CM | POA: Diagnosis not present

## 2019-01-02 DIAGNOSIS — M4722 Other spondylosis with radiculopathy, cervical region: Secondary | ICD-10-CM | POA: Diagnosis not present

## 2019-01-02 DIAGNOSIS — G959 Disease of spinal cord, unspecified: Secondary | ICD-10-CM | POA: Diagnosis not present

## 2019-01-02 DIAGNOSIS — R200 Anesthesia of skin: Secondary | ICD-10-CM | POA: Insufficient documentation

## 2019-01-02 DIAGNOSIS — M4802 Spinal stenosis, cervical region: Secondary | ICD-10-CM | POA: Diagnosis not present

## 2019-01-02 DIAGNOSIS — M50223 Other cervical disc displacement at C6-C7 level: Secondary | ICD-10-CM | POA: Diagnosis not present

## 2019-01-09 DIAGNOSIS — Z1159 Encounter for screening for other viral diseases: Secondary | ICD-10-CM | POA: Diagnosis not present

## 2019-01-15 DIAGNOSIS — M5412 Radiculopathy, cervical region: Secondary | ICD-10-CM | POA: Diagnosis not present

## 2019-01-15 DIAGNOSIS — M2578 Osteophyte, vertebrae: Secondary | ICD-10-CM | POA: Diagnosis not present

## 2019-01-30 DIAGNOSIS — M4722 Other spondylosis with radiculopathy, cervical region: Secondary | ICD-10-CM | POA: Diagnosis not present

## 2019-02-11 ENCOUNTER — Other Ambulatory Visit: Payer: Self-pay | Admitting: Cardiology

## 2019-02-11 DIAGNOSIS — I10 Essential (primary) hypertension: Secondary | ICD-10-CM

## 2019-02-21 ENCOUNTER — Telehealth: Payer: Self-pay | Admitting: Internal Medicine

## 2019-02-21 NOTE — Telephone Encounter (Signed)
He will need office visit

## 2019-02-21 NOTE — Telephone Encounter (Signed)
Paul Freeman [phone]  Paul Freeman called to say that he has swelling around his throat glands, nausea for a month, also his wife said last night he had the hiccups during his sleep, he was not aware. He had spine surgery over a month ago and he stated that his throat feels like it did after surgery, that feeling had gone away immediatly after surgery but is now back. Feels like when he burps that he don't completely finish it. No fever, No COVID exposure.

## 2019-02-21 NOTE — Telephone Encounter (Signed)
Scheduled

## 2019-02-22 ENCOUNTER — Other Ambulatory Visit: Payer: Self-pay

## 2019-02-22 ENCOUNTER — Ambulatory Visit
Admission: RE | Admit: 2019-02-22 | Discharge: 2019-02-22 | Disposition: A | Discharge status: Home / Self Care | Payer: 59 | Source: Ambulatory Visit | Attending: Internal Medicine | Admitting: Internal Medicine

## 2019-02-22 ENCOUNTER — Ambulatory Visit: Payer: 59 | Admitting: Internal Medicine

## 2019-02-22 ENCOUNTER — Encounter: Payer: Self-pay | Admitting: Internal Medicine

## 2019-02-22 VITALS — BP 100/80 | HR 73 | Temp 97.9°F | Ht 74.0 in | Wt 250.0 lb

## 2019-02-22 DIAGNOSIS — R931 Abnormal findings on diagnostic imaging of heart and coronary circulation: Secondary | ICD-10-CM | POA: Diagnosis not present

## 2019-02-22 DIAGNOSIS — M5090 Cervical disc disorder, unspecified, unspecified cervical region: Secondary | ICD-10-CM | POA: Diagnosis not present

## 2019-02-22 DIAGNOSIS — R1310 Dysphagia, unspecified: Secondary | ICD-10-CM | POA: Diagnosis not present

## 2019-02-22 DIAGNOSIS — K30 Functional dyspepsia: Secondary | ICD-10-CM

## 2019-02-22 DIAGNOSIS — Z8639 Personal history of other endocrine, nutritional and metabolic disease: Secondary | ICD-10-CM

## 2019-02-22 LAB — T4, FREE: Free T4: 1.3 ng/dL (ref 0.8–1.8)

## 2019-02-22 LAB — TSH: TSH: 0.61 mIU/L (ref 0.40–4.50)

## 2019-02-22 LAB — PSA: PSA: 0.4 ng/mL (ref <=4.0)

## 2019-02-22 MED ORDER — VENLAFAXINE HCL ER 75 MG PO CP24: 75.0000 mg | ORAL_CAPSULE | Freq: Every day | ORAL | 1 refills | Status: DC

## 2019-02-22 NOTE — Progress Notes (Signed)
Subjective:    Patient ID: Paul Freeman, male    DOB: [birth year], 43 y.o.   MRN: [id number]  HPI 44 year old Male in today with a number of issues.  He presented to the emergency department in October 31 with right trapezius pain with radicular symptoms.  He tried anti-inflammatory medication and Robaxin without improvement.  Previous day he had had a cardiac CT ordered by Dr. Jacinto Halim.  He has an elevated calcium score of 109.  There is a granuloma in the right lung base.  Aorta was normal.  Patient received IM Toradol and Decadron in the emergency department and he was given prescriptions for Flexeril and prednisone as well as a Lidoderm patch.  He did not improve and presented back to the emergency department on November for similar symptoms.  Right upper extremity pain and neck pain.  He had a CT of the spine without contrast.  Was found to have mild neural foraminal narrowing C7-T1 bilaterally.  Continued with symptoms including right radiculopathy and saw Dr. Danielle Dess.  Subsequently had surgery by Dr. Danielle Dess as an outpatient at [facility] and is doing well.  I do not have a copy of the op report.  Has been more irritable recently.  He worries about his wife who apparently has chemotherapy-induced cardiomyopathy.  Has had some family issues but that seems to be better recently.  Has been on Wellbutrin but would like to try Effexor.  Wellbutrin will be discontinued.  He is worried about hyperthyroidism.  At one point Dr. Norma Fredrickson diagnosed him with hyperthyroidism and referred him to Dr. Cleon Gustin.  He was given prescription for methimazole but it caused nausea.  He would like thyroid functions checked.  Has situational stress at work which was discussed at length.    Review of Systems see above-at one point was having some GE reflux symptoms but that seems to be improved.  He is on Crestor 10 mg daily.     Objective:   Physical Exam  About 40 minutes  was spent with patient today.  He has no thyromegaly.  His grips are equal. Blood pressure is 100/80, pulse 73, temperature 97.9 degrees pulse oximetry 97% weight 250 pounds, BMI 32.10     Assessment & Plan:  He has an elevated calcium score and is at risk for coronary artery disease.  He will continue close follow-up with Dr. Jacinto Halim.  Situational stress-weight Wellbutrin to Effexor and follow-up in 4 to 6 weeks.  He may well need 150 mg instead of 75 mg daily.  Recent cervical surgery by Dr. Danielle Dess for what sounds like herniated disc with right radiculopathy.  He has recovered well.  I do not have those records.  History of hypothyroidism-unable to tolerate methimazole.  Check free T4 and TSH today.  Addendum: Free T4 and TSH are within normal limits and will need to be monitored regularly.  Plan: Start Effexor Exar 75 mg daily and follow-up in 4 to 6 weeks.  Discontinue Wellbutrin.  May benefit from counseling.

## 2019-02-24 DIAGNOSIS — Z8639 Personal history of other endocrine, nutritional and metabolic disease: Secondary | ICD-10-CM | POA: Insufficient documentation

## 2019-02-24 DIAGNOSIS — M5090 Cervical disc disorder, unspecified, unspecified cervical region: Secondary | ICD-10-CM | POA: Insufficient documentation

## 2019-02-24 DIAGNOSIS — R931 Abnormal findings on diagnostic imaging of heart and coronary circulation: Secondary | ICD-10-CM | POA: Insufficient documentation

## 2019-02-24 NOTE — Patient Instructions (Addendum)
Start Effexor XR 75 mg daily and follow-up in 4 to 6 weeks.  Thyroid functions drawn and pending.  May need counseling.  Counseling discussed.

## 2019-03-05 ENCOUNTER — Ambulatory Visit: Payer: BLUE CROSS/BLUE SHIELD | Admitting: Neurology

## 2019-03-13 ENCOUNTER — Other Ambulatory Visit: Payer: Self-pay | Admitting: Internal Medicine

## 2019-03-16 ENCOUNTER — Other Ambulatory Visit: Payer: Self-pay | Admitting: Internal Medicine

## 2019-03-28 ENCOUNTER — Ambulatory Visit: Payer: BC Managed Care – PPO | Admitting: Internal Medicine

## 2019-03-28 ENCOUNTER — Ambulatory Visit: Payer: 59 | Admitting: Internal Medicine

## 2019-05-05 ENCOUNTER — Other Ambulatory Visit: Payer: Self-pay | Admitting: Internal Medicine

## 2019-06-17 ENCOUNTER — Ambulatory Visit: Payer: BC Managed Care – PPO | Admitting: Cardiology

## 2019-06-17 NOTE — Progress Notes (Deleted)
Primary Physician/Referring:  Margaree Mackintosh, MD  Patient ID: Paul Freeman, male    DOB: 04/20/1975, 44 y.o.   MRN: [id number]  No chief complaint on file.  HPI:    Paul Freeman  is a 44 y.o. Caucasian male with hypertension, extreme anxiety, mild  G6PD deficiency, who has responded well to blood pressure control with lisinopril HCT, HCT utilized in spite of G6PD deficiency at a low dose.  He has tolerated this for years.  He is also on metoprolol and could not be switched to any other agent including propranolol for anxiety.  He called in stating that he has been having frequent episodes of chest discomfort and episodes of dyspnea and is concerned about coronary artery disease as his coworker had similar symptoms and was diagnosed with multivessel disease needing bypass surgery.  *** Chest pain is described as tightness to heaviness retrosternal pain, sometimes on the left side of the chest, feels like it radiates to his neck, to his back.  Episodes are brought on by stress and sometimes with exertion activity at other times even with exertion activity he does not seem to have any chest pain.  He has been concerned about persistent symptoms sometimes lasting for 20 to 25 minutes.  He also profoundly sweats during exertion activity and that is fairly common for him and hence does not know whether chest pain associated diaphoresis is abnormal or not.   Past Medical History:  Diagnosis Date  . Anxiety   . Depression   . Hypertension   . Mild hyperlipidemia   . Pneumonia    Past Surgical History:  Procedure Laterality Date  . APPENDECTOMY    . CYSTECTOMY  2008   cyst removal from left armpit   Family History  Problem Relation Age of Onset  . Breast cancer Mother   . Arthritis Mother   . Hypertension Father   . Atrial fibrillation Father   . Prostate cancer Father   . Multiple sclerosis Brother   . Colon cancer Neg Hx     Social History   Tobacco Use  . Smoking status:  Former Smoker    Packs/day: 0.50    Years: 24.00    Pack years: 12.00    Quit date: 2013    Years since quitting: 8.3  . Smokeless tobacco: Never Used  Substance Use Topics  . Alcohol use: Yes    Comment: occ   Marital Status: Married  ROS  ***Review of Systems  Constitution: Negative for weight gain.  Cardiovascular: Positive for chest pain and dyspnea on exertion. Negative for leg swelling and syncope.  Respiratory: Negative for shortness of breath.   Musculoskeletal: Negative for joint swelling.   Objective  There were no vitals taken for this visit.  Vitals with BMI 02/22/2019 12/12/2018 12/08/2018  Height 6\' 2"  - 6\' 2"   Weight 250 lbs - 250 lbs  BMI 32.08 - 32.08  Systolic 100 113 295  Diastolic 80 74 87  Pulse 73 85 76     ***Physical Exam  Constitutional: He appears well-developed. No distress.  mildly obese  Cardiovascular: Normal rate, regular rhythm and intact distal pulses. Exam reveals no gallop.  No murmur heard. No leg edema. No JVD.    Pulmonary/Chest: Effort normal and breath sounds normal. No accessory muscle usage. No respiratory distress.  Abdominal: Soft.   Laboratory examination:   Recent Labs    09/17/18 1042 11/28/18 0812  NA 140 140  K 4.0 4.0  CL 103 102  CO2 29 25  GLUCOSE 90 134*  BUN 16 10  CREATININE 1.00 1.00  CALCIUM 9.7 9.2  GFRNONAA 92 92  GFRAA 107 107   CrCl cannot be calculated (Patient's most recent lab result is older than the maximum 21 days allowed.).  CMP Latest Ref Rng & Units 11/28/2018 09/17/2018 07/14/2017  Glucose 65 - 99 mg/dL 981(X) 90 914(N)  BUN 6 - 24 mg/dL 10 16 15   Creatinine 0.76 - 1.27 mg/dL 8.29 5.62 1.30  Sodium 134 - 144 mmol/L 140 140 140  Potassium 3.5 - 5.2 mmol/L 4.0 4.0 3.2(L)  Chloride 96 - 106 mmol/L 102 103 103  CO2 20 - 29 mmol/L 25 29 28   Calcium 8.7 - 10.2 mg/dL 9.2 9.7 9.3  Total Protein 6.1 - 8.1 g/dL - 7.1 -  Total Bilirubin 0.2 - 1.2 mg/dL - 0.8 -  Alkaline Phos 39 - 117 U/L - - -    AST 10 - 40 U/L - 19 -  ALT 9 - 46 U/L - 24 -   CBC Latest Ref Rng & Units 09/17/2018 01/25/2018 07/14/2017  WBC 3.8 - 10.8 Thousand/uL 6.2 - 9.2  Hemoglobin 13.2 - 17.1 g/dL 86.5 78.4 69.6  Hematocrit 38.5 - 50.0 % 42.6 - 40.3  Platelets 140 - 400 Thousand/uL 182 - 164   Lipid Panel     Component Value Date/Time   CHOL 136 09/17/2018 1042   TRIG 107 09/17/2018 1042   HDL 53 09/17/2018 1042   CHOLHDL 2.6 09/17/2018 1042   VLDL 24.0 04/18/2011 0904   LDLCALC 64 09/17/2018 1042   HEMOGLOBIN A1C No results found for: HGBA1C, MPG TSH Recent Labs    02/22/19 1125  TSH 0.61    External labs:   *** PSA 0.400 02/22/2019  G6PD deficiency 01/25/18: G-6-PD, Quant 4.6 - 13.5 U/g Hb <.3Low    Medications and allergies   Allergies  Allergen Reactions  . Penicillins Other (See Comments)    UNKNOWN/childhood      Current Outpatient Medications  Medication Instructions  . amLODipine (NORVASC) 5 mg, Oral, Daily  . buPROPion (WELLBUTRIN XL) 150 mg, Oral, 3 times daily  . cetirizine (ZYRTEC) 10 mg, Oral, Daily  . Coenzyme Q10 (COQ10 PO) Oral, Daily  . diazepam (VALIUM) 10 MG tablet TAKE 1/2 TO 1 TABLET BY MOUTH 2-3 TIMES DAILY AS NEEDED  . lisinopril-hydrochlorothiazide (ZESTORETIC) 20-12.5 MG tablet TAKE 1 TABLET BY MOUTH EVERY DAY  . methocarbamol (ROBAXIN) 500 mg, Oral, 2 times daily PRN  . metoprolol tartrate (LOPRESSOR) 100 MG tablet TAKE 1 TABLET BY MOUTH TWICE A DAY  . Multiple Vitamins-Minerals (MULTIVITAMIN ADULT PO) Oral, Daily, Vitamin pack   . rosuvastatin (CRESTOR) 10 mg, Oral, Daily  . venlafaxine XR (EFFEXOR-XR) 75 MG 24 hr capsule TAKE 1 CAPSULE BY MOUTH DAILY WITH BREAKFAST   Radiology:   No results found.  Cardiac Studies:   Coronary calcium score 05/10/2016: On a calcium score of 83.6, 94 percentile for subjects in the same age group. Based on age 44 years. Old granular  disease.   Treadmill exercise stress test 02/24/2017: Indication: Screening for CAD,  hypertension The patient exercised on Bruce protocol for 9:44 min. Patient achieved 11.36 METS and reached HR 185 bpm, which is 102 % of maximum age-predicted HR. Stress test terminated due to fatigue. Resting EKG demonstrates Normal sinus rhythm. ST Changes: With peak exercise there was no ST-T changes of ischemia.   Chest Pain: none. BP Response to Exercise: Normal  resting BP- appropriate response. Hypertensive at rest 164/102 mm Hg. However in recovery, patient developed severe hypotension with BP 80/60 mm Hg. Took 10 minutes before he recuperated with normal BP and dizziness subsided. No arrhythmias. Rec: Patient's BP response is probably normal but exaggerated response with vasodilatation post exercise. Excellent effort without chest pain or dyspnea.  Recommend echocardiogram to evaluate LV function.  Echocardiogram 02/28/2017: 1. Left ventricle cavity is normal in size. Mild concentric hypertrophy of the left ventricle. Normal global wall motion. Normal diastolic filling pattern. Calculated EF 59%. 2. Left atrial cavity is mildly dilated. 3. Mild (Grade I) mitral regurgitation. 4. Mild tricuspid regurgitation. No evidence of pulmonary hypertension. 5. IVC is dilated with respiratory variation.  Coronary Calcium Score 12/07/2018:  1. Coronary artery calcium score 109 Agatston units. This places the patient in the 95th percentile for age and gender, suggesting high risk for future cardiac events. 2. Probably mild ostial and proximal LAD stenosis. Will confirm with FFR. Addendum: There was no evidence for hemodynamically significant stenosis by FFR.  EKG  ***   11/12/2018: Normal sinus rhythm with rate of 72 bpm, normal axis, IVCD, borderline criteria for LVH.  No evidence of ischemia.   Assessment     ICD-10-CM   1. Other chest pain  R07.89   2. Agatston coronary artery calcium score less than 100  R93.1   3. Essential hypertension  I10   4. Mild hyperlipidemia  E78.5      No orders  of the defined types were placed in this encounter.   There are no discontinued medications.  Recommendations:   ***Patient with hypertension, hyperlipidemia presenting with chest pain at most appears atypical for angina pectoris.  I reviewed his labs, lipids in the note are under excellent control, blood pressure is elevated today, both for possible angina and hypertension, I have added amlodipine 5 mg daily along with continued metoprolol and lisinopril HCT.  Best option in view of coronary calcification and his cardiac risk factors is to proceed with coronary CTA with possible FFR if intermediate lesions were to be found.  Patient is willing to go through with this.  I would like to see him back in 4 weeks for follow-up.  Weight loss was discussed with the patient.  Yates Decamp, MD, [facility] 06/17/2019, 11:26 AM [facility] [place] Pager: [phone] Office: [phone]

## 2019-07-15 ENCOUNTER — Other Ambulatory Visit: Payer: Self-pay | Admitting: Internal Medicine

## 2019-07-15 NOTE — Telephone Encounter (Signed)
Left message to call back  

## 2019-07-15 NOTE — Telephone Encounter (Signed)
CPE due in August. Please contact him for appt before refilling Valium

## 2019-07-18 ENCOUNTER — Other Ambulatory Visit: Payer: Self-pay

## 2019-07-18 MED ORDER — DIAZEPAM 10 MG PO TABS: ORAL_TABLET | ORAL | 1 refills | Status: DC

## 2019-07-18 NOTE — Telephone Encounter (Signed)
Last CPE 09/19/18, has CPE scheduled in August.

## 2019-07-18 NOTE — Telephone Encounter (Signed)
Patient called and scheduled CPE and labs

## 2019-07-18 NOTE — Addendum Note (Signed)
Addended by: Mady Haagensen on: 07/18/2019 11:39 AM   Modules accepted: Orders

## 2019-07-19 NOTE — Telephone Encounter (Signed)
This is the 3rd time I have seen this. I have already filled. Please check

## 2019-08-14 ENCOUNTER — Other Ambulatory Visit: Payer: Self-pay | Admitting: Internal Medicine

## 2019-09-06 ENCOUNTER — Other Ambulatory Visit: Payer: Self-pay

## 2019-09-06 ENCOUNTER — Encounter: Payer: Self-pay | Admitting: Internal Medicine

## 2019-09-06 ENCOUNTER — Other Ambulatory Visit: Payer: No Typology Code available for payment source | Admitting: Internal Medicine

## 2019-09-06 VITALS — BP 110/70 | HR 60

## 2019-09-06 DIAGNOSIS — Z0000 Encounter for general adult medical examination without abnormal findings: Secondary | ICD-10-CM

## 2019-09-06 DIAGNOSIS — F419 Anxiety disorder, unspecified: Secondary | ICD-10-CM

## 2019-09-06 DIAGNOSIS — Z125 Encounter for screening for malignant neoplasm of prostate: Secondary | ICD-10-CM

## 2019-09-06 DIAGNOSIS — Z13220 Encounter for screening for lipoid disorders: Secondary | ICD-10-CM

## 2019-09-06 DIAGNOSIS — E0590 Thyrotoxicosis, unspecified without thyrotoxic crisis or storm: Secondary | ICD-10-CM

## 2019-09-06 DIAGNOSIS — F439 Reaction to severe stress, unspecified: Secondary | ICD-10-CM

## 2019-09-06 DIAGNOSIS — D75A Glucose-6-phosphate dehydrogenase (G6PD) deficiency without anemia: Secondary | ICD-10-CM

## 2019-09-06 DIAGNOSIS — I10 Essential (primary) hypertension: Secondary | ICD-10-CM

## 2019-09-06 NOTE — Progress Notes (Signed)
Patient was here for fasting labs. During venipuncture he started to feel faint. We tried to walk him to a room after giving him a cold drink and he had presyncope in hall. He never lost complete consciousness. His VS were stable and recovered without consequences.

## 2019-09-07 LAB — CBC WITH DIFFERENTIAL/PLATELET
Absolute Monocytes: 372 cells/uL (ref 200–950)
Basophils Absolute: 19 cells/uL (ref 0–200)
Basophils Relative: 0.3 %
Eosinophils Absolute: 43 cells/uL (ref 15–500)
Eosinophils Relative: 0.7 %
HCT: 44.3 % (ref 38.5–50.0)
Hemoglobin: 15.5 g/dL (ref 13.2–17.1)
Lymphs Abs: 1686 cells/uL (ref 850–3900)
MCH: 31 pg (ref 27.0–33.0)
MCHC: 35 g/dL (ref 32.0–36.0)
MCV: 88.6 fL (ref 80.0–100.0)
MPV: 10.1 fL (ref 7.5–12.5)
Monocytes Relative: 6 %
Neutro Abs: 4080 cells/uL (ref 1500–7800)
Neutrophils Relative %: 65.8 %
Platelets: 205 10*3/uL (ref 140–400)
RBC: 5 10*6/uL (ref 4.20–5.80)
RDW: 11.8 % (ref 11.0–15.0)
Total Lymphocyte: 27.2 %
WBC: 6.2 10*3/uL (ref 3.8–10.8)

## 2019-09-07 LAB — COMPLETE METABOLIC PANEL WITH GFR
AG Ratio: 2.1 (calc) (ref 1.0–2.5)
ALT: 41 U/L (ref 9–46)
AST: 28 U/L (ref 10–40)
Albumin: 4.9 g/dL (ref 3.6–5.1)
Alkaline phosphatase (APISO): 59 U/L (ref 36–130)
BUN: 14 mg/dL (ref 7–25)
CO2: 29 mmol/L (ref 20–32)
Calcium: 9.6 mg/dL (ref 8.6–10.3)
Chloride: 103 mmol/L (ref 98–110)
Creat: 0.97 mg/dL (ref 0.60–1.35)
GFR, Est African American: 110 mL/min/{1.73_m2} (ref >=60)
GFR, Est Non African American: 95 mL/min/{1.73_m2} (ref >=60)
Globulin: 2.3 g/dL (calc) (ref 1.9–3.7)
Glucose, Bld: 99 mg/dL (ref 65–99)
Potassium: 3.8 mmol/L (ref 3.5–5.3)
Sodium: 141 mmol/L (ref 135–146)
Total Bilirubin: 1.4 mg/dL — ABNORMAL HIGH (ref 0.2–1.2)
Total Protein: 7.2 g/dL (ref 6.1–8.1)

## 2019-09-07 LAB — LIPID PANEL
Cholesterol: 118 mg/dL (ref <200)
HDL: 49 mg/dL (ref >=40)
LDL Cholesterol (Calc): 53 mg/dL (calc)
Non-HDL Cholesterol (Calc): 69 mg/dL (calc) (ref <130)
Total CHOL/HDL Ratio: 2.4 (calc) (ref <5.0)
Triglycerides: 80 mg/dL (ref <150)

## 2019-09-07 LAB — TESTOSTERONE: Testosterone: 334 ng/dL (ref 250–827)

## 2019-09-07 LAB — TSH: TSH: 0.45 mIU/L (ref 0.40–4.50)

## 2019-09-07 LAB — PSA: PSA: 0.4 ng/mL (ref <=4.0)

## 2019-09-11 ENCOUNTER — Other Ambulatory Visit: Payer: Self-pay | Admitting: Internal Medicine

## 2019-09-13 ENCOUNTER — Other Ambulatory Visit: Payer: 59 | Admitting: Internal Medicine

## 2019-09-13 ENCOUNTER — Other Ambulatory Visit: Payer: Self-pay | Admitting: Internal Medicine

## 2019-09-20 ENCOUNTER — Ambulatory Visit (INDEPENDENT_AMBULATORY_CARE_PROVIDER_SITE_OTHER): Payer: No Typology Code available for payment source | Admitting: Internal Medicine

## 2019-09-20 ENCOUNTER — Other Ambulatory Visit: Payer: Self-pay

## 2019-09-20 ENCOUNTER — Encounter: Payer: Self-pay | Admitting: Internal Medicine

## 2019-09-20 VITALS — BP 120/80 | HR 74 | Ht 74.0 in | Wt 258.0 lb

## 2019-09-20 DIAGNOSIS — R17 Unspecified jaundice: Secondary | ICD-10-CM

## 2019-09-20 DIAGNOSIS — I10 Essential (primary) hypertension: Secondary | ICD-10-CM

## 2019-09-20 DIAGNOSIS — F439 Reaction to severe stress, unspecified: Secondary | ICD-10-CM

## 2019-09-20 DIAGNOSIS — B360 Pityriasis versicolor: Secondary | ICD-10-CM

## 2019-09-20 DIAGNOSIS — D75A Glucose-6-phosphate dehydrogenase (G6PD) deficiency without anemia: Secondary | ICD-10-CM

## 2019-09-20 DIAGNOSIS — Z0000 Encounter for general adult medical examination without abnormal findings: Secondary | ICD-10-CM | POA: Diagnosis not present

## 2019-09-20 DIAGNOSIS — Z8639 Personal history of other endocrine, nutritional and metabolic disease: Secondary | ICD-10-CM

## 2019-09-20 DIAGNOSIS — M542 Cervicalgia: Secondary | ICD-10-CM

## 2019-09-20 DIAGNOSIS — E782 Mixed hyperlipidemia: Secondary | ICD-10-CM

## 2019-09-20 LAB — BILIRUBIN, TOTAL: Total Bilirubin: 1 mg/dL (ref 0.2–1.2)

## 2019-09-20 MED ORDER — KETOCONAZOLE 2 % EX SHAM: 1.0000 "application " | MEDICATED_SHAMPOO | CUTANEOUS | 99 refills | Status: DC

## 2019-09-20 MED ORDER — VENLAFAXINE HCL ER 75 MG PO CP24: ORAL_CAPSULE | ORAL | 1 refills | Status: DC

## 2019-09-20 MED ORDER — VENLAFAXINE HCL ER 150 MG PO CP24: 150.0000 mg | ORAL_CAPSULE | Freq: Every day | ORAL | 0 refills | Status: DC

## 2019-09-20 NOTE — Patient Instructions (Addendum)
Tinea versicolor Use Nizoral shampoo twice a week.  Phone number given for [facility] Department of psychiatry to consider for counseling.  Continue current medications and follow-up in 6 months.

## 2019-09-20 NOTE — Progress Notes (Signed)
Subjective:    Patient ID: Paul Freeman, adult    DOB: 12-05-1975, 44 y.o.   MRN: [id number]  HPI  44 year old Male for health maintenance exam and evaluation of medical issues.  Patient has longstanding dysthymia.  Currently on Wellbutrin 150 mg XL daily and takes Valium 10 mg as needed up to 3 times daily.  History of hypertension treated with Zestoretic 20/12.5 and metoprolol 100 mg twice daily.  He had an exercise test by Dr. Jacinto Halim that was normal in 2019.  No evidence of ischemia on EKG.  He has a remote history of smoking but quit in 2013 after smoking 1 or 2 packs a day for some 24 years.  Echocardiogram was within normal limits.  Was diagnosed with floaters in the left eye by Dr. Nile Riggs in 2019.  These do not bother him.  History of low TSH consistent with hyperthyroidism and was referred by Dr. Domingo Madeira to Dr. Talmage Nap.  Had thyroid scan at [facility] showing uniform uptake.  Was tried on methimazole but that caused nausea.  He was simply monitored by Dr. Talmage Nap.  History of dysplastic compound nevus with severe melanocytic atypia upper back.  Had sleep study by Dr. Vickey Huger showing snoring but no apnea.  Old records indicate he had Tdap vaccine in 2013.  Past medical history: Includes appendectomy 1996.  History of urticaria/hives.  Dr. Renne Crigler saw him in October 19 and diagnosed him with generalized anxiety disorder and essential hypertension.  Also noted history of restless leg syndrome.  Had Tdap vaccine in 2013.  He was seen in the emergency department at [facility] in 2019 with atypical chest pain.  EKG was unremarkable other than intraventricular conduction delay.  He subsequently saw Dr. Jacinto Halim who according to our records perform stress test in spring 2019 which was normal.  Dr. Samuel Germany has him on lisinopril Crestor metoprolol.  He takes Robaxin as needed.  History of allergic rhinitis for which he takes Zyrtec.  Patient has history of anxiety and depression.   He and his wife is seeing [facility] for counseling regarding situational stress with the patient's parents but unfortunately Mr. Arita Miss is now deceased.  I think patient could benefit from counseling and have suggested someone at [facility] for him to contact.  He does not want to go back to [facility].  He says he was also seen at [facility] and was placed on numerous medications in the past all of which he stopped taking.  Currently just on Wellbutrin and diazepam.  These appear to work well for him.  He was evaluated by Dr. Elisabeth Most for possible attention deficit disorder but was told he does not have attention deficit issues.  Patient also has some issues with neck pain.  Patient underwent appendectomy in 1996.  He saw Dr. Vickey Huger in 2019 for possible sleep apnea which was not failed.  He was found to have G6PD deficiency with level being less than 0.3.  At the time he was complaining of some muscle pain and not feeling rested after sleeping.  Total CK was normal.  Sed rate was normal and C-reactive protein was normal.    Review of Systems has some work stress.  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  Previously had some issues with his parents and brother but that seems to be improving.  When patient came for fasting labs on July 30 he had vasovagal syncope related to blood drawing but recovered quickly.  His bilirubin was slightly elevated at 1.4 fasting consistent with Gilbert's and was repeated today and is normal at 1.0.     Objective:   Physical Exam Blood pressure 120/80 pulse 74 pulse oximetry 96% weight 258 pounds BMI 33.13 height 6 feet 2 inches  Skin warm and dry.  No cervical adenopathy.  TMs are clear.  PERRLA.  No scleral icterus.  No facial weakness.  Neck is supple without JVD thyromegaly or carotid bruits.  Chest is clear to auscultation without rales or wheezing.  Cardiac exam regular rate and rhythm normal S1 and S2 without murmurs or  gallops.  Abdomen soft nondistended without hepatosplenomegaly masses or tenderness.  Extremities without deformity or edema.  Neuro no gross focal deficits on brief neurological exam.  Affect is slightly anxious.         Assessment & Plan:  Situational stress.  Stress with family has improved.  Some stress with business.  Recommend counseling.  Have given him the phone number of psychiatric department at [facility] that he can call for appointment.  Anxiety depression treated with Valium and Wellbutrin  Hyperlipidemia treated with Crestor  Essential hypertension treated with Zestoretic and metoprolol  History of musculoskeletal pain-neck treated with as needed Robaxin  History of G6PD deficiency-has to avoid certain antibiotics  History of hyperthyroidism-has been referred to Dr. Talmage Nap in the past and apparently this is just being monitored.  He is TSH today falls within low normal limits at 0.45  Has has tinea versicolor on his back and have recommended Nizoral shampoo twice a week  Plan: Phone number given for [facility] Department of psychiatry to consider counseling.  Use Nizoral shampoo on body twice a week for tinea versicolor.  Continue current medications and follow-up here in 6 months.

## 2019-10-04 ENCOUNTER — Other Ambulatory Visit: Payer: Self-pay

## 2019-10-04 ENCOUNTER — Telehealth (INDEPENDENT_AMBULATORY_CARE_PROVIDER_SITE_OTHER): Payer: No Typology Code available for payment source | Admitting: Internal Medicine

## 2019-10-04 VITALS — BP 130/80 | HR 78

## 2019-10-04 DIAGNOSIS — U071 COVID-19: Secondary | ICD-10-CM | POA: Diagnosis not present

## 2019-10-04 MED ORDER — AZITHROMYCIN 250 MG PO TABS: ORAL_TABLET | ORAL | 0 refills | Status: DC

## 2019-10-05 ENCOUNTER — Encounter: Payer: Self-pay | Admitting: Internal Medicine

## 2019-10-05 NOTE — Patient Instructions (Signed)
Continue to monitor your symptoms and pulse oximetry.  Call if your pulse oximetry worsens.  Stay well-hydrated.  Take Zithromax Z-PAK as directed and Tylenol if needed for fever.  You must quarantine for 10 days from onset of illness.

## 2019-10-05 NOTE — Progress Notes (Signed)
Subjective:    Patient ID: Paul Freeman, adult    DOB: Oct 15, 1975, 44 y.o.   MRN: [id number]  HPI 44 year old Male just seen August 13 for health maintenance exam with history of hypertension treated with Zestoretic and metoprolol which is followed by Dr. Jacinto Halim.  He also has a history of hyperlipidemia.   Patient has developed respiratory congestion, cough malaise fatigue and fever as of last weekend.  He has not had COVID-19 immunizations.  He operates an Theme park manager business.    He is seen today via audio [employer] video telecommunications in the Coronavirus pandemic.    He is agreeable to visit in this format today and is identified as Paul Freeman, a patient in this practice using 2 identifiers.  Patient is at home and I am at my office.    He denies shortness of breath.  No nausea or vomiting.  He is concerned about missing work and is anxious.  Entire family including wife and children have COVID-19 symptoms.  History of hyperthyroidism which is simply monitored by Dr. Talmage Nap and not on medication.  He has history of G6PD deficiency.  History of anxiety depression treated with Valium and Wellbutrin.  Wife may qualify for infusion therapy. Contacting infusion center with her info today.  Patient does not qualify for infusion therapy.  Patient has cough, malaise, and fatigue.  Some sputum production but not a lot.    His father brought him a pulse oximetry device and left it on the doorstep.  Apparently, his pulse ox is 94% today.  Entire family patient, his wife and 2 children all had positive Covid test and he sent a picture of these results today.    He knows to continue to monitor oxygen saturations and keep well-hydrated.   Review of Systems- see above     Objective:   Physical Exam  Reports fever, cough, myalgias, headache, malaise and fatigue      Assessment & Plan:  COVID-19 virus infection-does not qualify for infusion therapy  Plan: Zithromax Z-PAK 2 p.o.  day one followed by one p.o. days 2 through 5.  Rest and drink plenty of fluids.  Tylenol as needed for fever and myalgias.  Stay well-hydrated.  Watch O2 saturations and call if they drop significantly.  Will need to quarantine for 10 days from onset of illness which was last weekend.

## 2019-10-08 NOTE — Telephone Encounter (Signed)
From patient.

## 2019-10-08 NOTE — Telephone Encounter (Signed)
Reviewed her chart, spoke to patient and her husband and reassured, will continue to follow sidelines from cardiac standpoint and happy to see her at any point.   Adrian Prows, MD, [facility] 10/08/2019, 11:44 AM Office: [phone]  [phone]- Mobile

## 2019-10-09 ENCOUNTER — Telehealth (INDEPENDENT_AMBULATORY_CARE_PROVIDER_SITE_OTHER): Payer: No Typology Code available for payment source | Admitting: Internal Medicine

## 2019-10-09 ENCOUNTER — Encounter: Payer: Self-pay | Admitting: Internal Medicine

## 2019-10-09 ENCOUNTER — Other Ambulatory Visit: Payer: Self-pay

## 2019-10-09 DIAGNOSIS — R05 Cough: Secondary | ICD-10-CM | POA: Diagnosis not present

## 2019-10-09 DIAGNOSIS — U071 COVID-19: Secondary | ICD-10-CM | POA: Diagnosis not present

## 2019-10-09 DIAGNOSIS — R059 Cough, unspecified: Secondary | ICD-10-CM

## 2019-10-09 MED ORDER — LEVOFLOXACIN 500 MG PO TABS: 500.0000 mg | ORAL_TABLET | Freq: Every day | ORAL | 0 refills | Status: DC

## 2019-10-09 NOTE — Telephone Encounter (Signed)
Called patient to see how he is doing with Covid-19 virus infection.  He has sent My chart message earlier today asking for more medication as he will be finishing Zithromax Z-PAK.  His wife is hospitalized with COVID-19 pneumonia.  He is identified using 2 identifiers as Paul Freeman, a patient in this practice.  He is agreeable to speak with me in this format.  I am at my office and he is at home.  Patient says he is coughing.  Says temperature has been up to 99.9 earlier today.  He takes Tylenol.  Sometimes coughing up discolored sputum.  He is finishing Zithromax Z-PAK.  This was called in for him last week.  Has fatigue and malaise.  He messaged me that he would like to get a chest x-ray.  We will order that at [facility] as he lives in [place].   His 2 children are recovering from COVID-19 as well and have not been nearly as ill as he and his wife.  Has little appetite.  He is trying to stay well-hydrated.  Complains of generalized weakness.  Plan: Calling in Levaquin 500 mg daily for 10 days.  He is finishing up Zithromax Z-Pak.  I do not think he needs steroids at the present time.  He will have chest x-ray.

## 2019-10-10 ENCOUNTER — Other Ambulatory Visit: Payer: Self-pay

## 2019-10-10 ENCOUNTER — Ambulatory Visit (HOSPITAL_BASED_OUTPATIENT_CLINIC_OR_DEPARTMENT_OTHER)
Admission: RE | Admit: 2019-10-10 | Discharge: 2019-10-10 | Disposition: A | Discharge status: Home / Self Care | Payer: No Typology Code available for payment source | Source: Ambulatory Visit | Attending: Internal Medicine | Admitting: Internal Medicine

## 2019-10-10 DIAGNOSIS — U071 COVID-19: Secondary | ICD-10-CM | POA: Diagnosis present

## 2019-10-10 DIAGNOSIS — R05 Cough: Secondary | ICD-10-CM | POA: Diagnosis present

## 2019-10-30 ENCOUNTER — Other Ambulatory Visit: Payer: Self-pay | Admitting: Cardiology

## 2019-10-30 ENCOUNTER — Other Ambulatory Visit: Payer: Self-pay | Admitting: Internal Medicine

## 2019-10-30 DIAGNOSIS — I10 Essential (primary) hypertension: Secondary | ICD-10-CM

## 2019-11-13 ENCOUNTER — Other Ambulatory Visit: Payer: Self-pay

## 2019-11-13 ENCOUNTER — Ambulatory Visit: Payer: No Typology Code available for payment source | Admitting: Internal Medicine

## 2019-11-13 ENCOUNTER — Encounter: Payer: Self-pay | Admitting: Internal Medicine

## 2019-11-13 VITALS — BP 130/80 | HR 80 | Temp 97.8°F | Ht 74.0 in | Wt 256.0 lb

## 2019-11-13 DIAGNOSIS — Z23 Encounter for immunization: Secondary | ICD-10-CM

## 2019-11-13 DIAGNOSIS — M25561 Pain in right knee: Secondary | ICD-10-CM

## 2019-11-13 NOTE — Progress Notes (Signed)
Subjective:    Patient ID: Paul Freeman, adult    DOB: [birth year], 44 y.o.   MRN: [id number]  HPI 44 year old male seen today regarding right knee pain.  Wife has been hospitalized with COVID-19 pneumonia.  Has recovered and sent home.  Is on chronic oxygen therapy.  Patient has situational stress at work.  Has situational stress with family.  Has a history of hyperlipidemia, history of hypothyroidism monitored by Dr. Talmage Nap.  Was intolerant of methimazole.  History of snoring but no evidence of sleep apnea by Dr. Vickey Huger sleep study.  History of generalized anxiety disorder.  History of hypertension.  History of restless leg syndrome.  Had stress test by Dr. Jacinto Halim spring 2019 which was normal.  He is maintained on lisinopril Crestor and metoprolol.  History of allergic rhinitis.  History of neck pain treated with as needed Robaxin.    Review of Systems see above     Objective:   Physical Exam Blood pressure 130/80 pulse 80 temperature 97.8 degrees pulse oximetry 98% weight 256 pounds Examination of right knee shows no effusion.  No significant tenderness along the medial or lateral collateral ligaments.  Knee is stable.  Drawer signs are negative.      Assessment & Plan:  Right knee pain-does not seem to have internal derangement-May take over-the-counter analgesics as needed  Plan: Flu vaccine given.  Patient reassured.

## 2019-11-21 ENCOUNTER — Other Ambulatory Visit: Payer: Self-pay | Admitting: Cardiology

## 2019-11-21 ENCOUNTER — Other Ambulatory Visit: Payer: Self-pay | Admitting: Internal Medicine

## 2019-11-21 DIAGNOSIS — I10 Essential (primary) hypertension: Secondary | ICD-10-CM

## 2019-12-08 NOTE — Patient Instructions (Signed)
Knee exam is really unremarkable.  May take over-the-counter analgesics on a as needed basis sparingly if needed.

## 2019-12-15 ENCOUNTER — Other Ambulatory Visit: Payer: Self-pay | Admitting: Cardiology

## 2019-12-15 DIAGNOSIS — I10 Essential (primary) hypertension: Secondary | ICD-10-CM

## 2019-12-17 ENCOUNTER — Other Ambulatory Visit: Payer: Self-pay | Admitting: Internal Medicine

## 2020-01-10 ENCOUNTER — Other Ambulatory Visit: Payer: Self-pay | Admitting: Cardiology

## 2020-01-10 DIAGNOSIS — I10 Essential (primary) hypertension: Secondary | ICD-10-CM

## 2020-01-12 ENCOUNTER — Other Ambulatory Visit: Payer: Self-pay | Admitting: Cardiology

## 2020-01-12 DIAGNOSIS — I10 Essential (primary) hypertension: Secondary | ICD-10-CM

## 2020-01-31 ENCOUNTER — Other Ambulatory Visit: Payer: Self-pay

## 2020-01-31 ENCOUNTER — Encounter (HOSPITAL_BASED_OUTPATIENT_CLINIC_OR_DEPARTMENT_OTHER): Payer: Self-pay

## 2020-01-31 ENCOUNTER — Emergency Department (HOSPITAL_BASED_OUTPATIENT_CLINIC_OR_DEPARTMENT_OTHER)
Admission: EM | Admit: 2020-01-31 | Discharge: 2020-01-31 | Disposition: A | Discharge status: Home / Self Care | Payer: No Typology Code available for payment source | Attending: Emergency Medicine | Admitting: Emergency Medicine

## 2020-01-31 DIAGNOSIS — I10 Essential (primary) hypertension: Secondary | ICD-10-CM | POA: Insufficient documentation

## 2020-01-31 DIAGNOSIS — M26609 Unspecified temporomandibular joint disorder, unspecified side: Secondary | ICD-10-CM

## 2020-01-31 DIAGNOSIS — R6884 Jaw pain: Secondary | ICD-10-CM | POA: Diagnosis present

## 2020-01-31 DIAGNOSIS — Z79899 Other long term (current) drug therapy: Secondary | ICD-10-CM | POA: Diagnosis not present

## 2020-01-31 DIAGNOSIS — Z87891 Personal history of nicotine dependence: Secondary | ICD-10-CM | POA: Insufficient documentation

## 2020-01-31 DIAGNOSIS — M26601 Right temporomandibular joint disorder, unspecified: Secondary | ICD-10-CM | POA: Diagnosis not present

## 2020-01-31 MED ORDER — KETOROLAC TROMETHAMINE 30 MG/ML IJ SOLN
30.0000 mg | Freq: Once | INTRAMUSCULAR | Status: AC
  Administered 2020-01-31: 15:00:00 30 mg via INTRAMUSCULAR
  Filled 2020-01-31: qty 1

## 2020-01-31 MED ORDER — NAPROXEN 375 MG PO TABS: 375.0000 mg | ORAL_TABLET | Freq: Two times a day (BID) | ORAL | 0 refills | Status: DC

## 2020-01-31 MED ORDER — ONDANSETRON 4 MG PO TBDP
4.0000 mg | ORAL_TABLET | Freq: Once | ORAL | Status: AC
  Administered 2020-01-31: 15:00:00 4 mg via ORAL
  Filled 2020-01-31: qty 1

## 2020-01-31 NOTE — ED Provider Notes (Signed)
[facility] EMERGENCY DEPARTMENT Provider Note   CSN: [id number] Arrival date & time: 01/31/20  1128     History Chief Complaint  Patient presents with  . Jaw Pain    Paul Freeman is a 44 y.o. adult with pertinent past medical history of anxiety, depression, hypertension, TMJ, that presents emergency department today for right-sided jaw pain.  Patient states that he is having jaw pain for multiple weeks now, patient recently seen a dentist the past couple of days, states that they were able to do x-rays which were negative.  Dentist wanted to refer patient to neurology to rule out trigeminal neuralgia.  Patient states that the pain has been getting worse over the past couple of days, states that sometimes he is unable to close his jaw.  Hurts worse when he chews.  Denies a headache, vision changes, neck pain.  Able to move his jaw and bite down, however states that for the past couple of days it has been extremely painful.  Has not been taking anything for this.  Does have a history of TMJ, states that he has NOT been using his retainer for multiple months now.  Does admit to grinding his teeth at night.  Denies any chest pain or shortness of breath.  Denies any cardiac history.  Denies any numbness or tingling.  Denies any electric shock sensation.  No episodes of severe shooting pain that feels like an electric shock, patient states that the pain is constant and feels as if someone is punching him constantly.  Patient states that he had his Covid shot yesterday, states that after this he has been feeling a little nauseous and dizzy.  States that currently he is slightly nauseous, dizziness has disappeared.  Has been eating and drinking normally.  No other symptoms.  No trauma to his jaw.  No ear pain, ear discharge, tinnitus.  No neck pain.   HPI     Past Medical History:  Diagnosis Date  . Anxiety   . Depression   . Hypertension   . Mild hyperlipidemia   . Pneumonia      Patient Active Problem List   Diagnosis Date Noted  . Cervical disc disease 02/24/2019  . Elevated coronary artery calcium score 02/24/2019  . History of hyperthyroidism 02/24/2019  . Situational stress 09/19/2018  . Neck pain 09/19/2018  . G6PD deficiency 09/19/2018  . Non-restorative sleep 01/03/2018  . Other chronic pain 01/03/2018  . Retrognathia 01/03/2018  . Loud snoring 01/03/2018  . Myalgia 01/03/2018  . Chronic fatigue 01/03/2018  . Hypertension 06/23/2017  . Anxiety   . Depression     Past Surgical History:  Procedure Laterality Date  . APPENDECTOMY    . CYSTECTOMY  2008   cyst removal from left armpit     OB History   No obstetric history on file.     Family History  Problem Relation Age of Onset  . Breast cancer Mother   . Arthritis Mother   . Hypertension Father   . Atrial fibrillation Father   . Prostate cancer Father   . Multiple sclerosis Brother   . Colon cancer Neg Hx     Social History   Tobacco Use  . Smoking status: Former Smoker    Packs/day: 0.50    Years: 24.00    Pack years: 12.00    Quit date: 2013    Years since quitting: 8.9  . Smokeless tobacco: Never Used  Vaping Use  . Vaping Use:  Never used  Substance Use Topics  . Alcohol use: Yes    Comment: occ  . Drug use: No    Home Medications Prior to Admission medications   Medication Sig Start Date End Date Taking? Authorizing Provider  amLODipine (NORVASC) 5 MG tablet TAKE 1 TABLET (5 MG TOTAL) BY MOUTH DAILY. NEEDS TO SCHEDULE APPT FOR REFILLS. 01/13/20   Adrian Prows, MD  cetirizine (ZYRTEC) 10 MG tablet TAKE 1 TABLET BY MOUTH EVERY DAY 08/15/19   Elby Showers, MD  Coenzyme Q10 (COQ10 PO) Take by mouth daily.    [provider]  diazepam (VALIUM) 10 MG tablet TAKE 1/2 TO 1 TABLET BY MOUTH 2-3 TIMES DAILY AS NEEDED 11/21/19   Elby Showers, MD  ketoconazole (NIZORAL) 2 % shampoo Apply 1 application topically 2 (two) times a week. 09/23/19   Elby Showers, MD   lisinopril-hydrochlorothiazide (ZESTORETIC) 20-12.5 MG tablet TAKE 1 TABLET BY MOUTH EVERY DAY 09/13/19   Elby Showers, MD  methocarbamol (ROBAXIN) 500 MG tablet TAKE 1 TABLET (500 MG TOTAL) BY MOUTH 2 (TWO) TIMES DAILY AS NEEDED FOR MUSCLE SPASMS. 11/03/18   Elby Showers, MD  metoprolol tartrate (LOPRESSOR) 100 MG tablet TAKE 1 TABLET BY MOUTH TWICE A DAY 10/30/19   Elby Showers, MD  Multiple Vitamins-Minerals (MULTIVITAMIN ADULT PO) Take by mouth daily. Vitamin pack    [provider]  naproxen (NAPROSYN) 375 MG tablet Take 1 tablet (375 mg total) by mouth 2 (two) times daily. 01/31/20   Alfredia Client, PA-C  rosuvastatin (CRESTOR) 10 MG tablet TAKE 1 TABLET BY MOUTH EVERY DAY 10/30/19   Elby Showers, MD  venlafaxine XR (EFFEXOR-XR) 150 MG 24 hr capsule TAKE 1 CAPSULE (150 MG TOTAL) BY MOUTH DAILY WITH BREAKFAST. 12/17/19   Elby Showers, MD    Allergies    Penicillins  Review of Systems   Review of Systems  Constitutional: Negative for chills, diaphoresis, fatigue and fever.  HENT: Negative for congestion, dental problem, drooling, facial swelling, hearing loss, mouth sores, postnasal drip, rhinorrhea, sinus pressure, sinus pain, sore throat and trouble swallowing.        Jaw pain  Eyes: Negative for pain and visual disturbance.  Respiratory: Negative for cough, shortness of breath and wheezing.   Cardiovascular: Negative for chest pain, palpitations and leg swelling.  Gastrointestinal: Negative for abdominal distention, abdominal pain, diarrhea, nausea and vomiting.  Genitourinary: Negative for difficulty urinating.  Musculoskeletal: Negative for back pain, neck pain and neck stiffness.  Skin: Negative for pallor.  Neurological: Negative for dizziness, speech difficulty, weakness and headaches.  Psychiatric/Behavioral: Negative for confusion.    Physical Exam Updated Vital Signs BP (!) 144/101 (BP Location: Right Arm)   Pulse 81   Temp 98.9 F (37.2 C) (Oral)   Resp  18   Ht 6\' 2"  (1.88 m)   Wt 120.2 kg   SpO2 97%   BMI 34.02 kg/m   Physical Exam Constitutional:      General: He is not in acute distress.    Appearance: Normal appearance. He is not ill-appearing, toxic-appearing or diaphoretic.  HENT:     Head:      Comments: Patient with tenderness to TMJ, tender when patient closes and opens jaw.  Is able to open and close jaw, is able to speak to me in full sentences.  Normal strength to jaw and masticator muscles.  No drooling.  No facial tenderness. No facial pain    Right Ear: Tympanic  membrane, ear canal and external ear normal.     Left Ear: Tympanic membrane, ear canal and external ear normal.     Mouth/Throat:     Mouth: Mucous membranes are moist.     Dentition: Normal dentition. Does not have dentures. No dental tenderness, gingival swelling or dental caries.     Tongue: No lesions.     Pharynx: Oropharynx is clear.  Eyes:     General: No scleral icterus.    Extraocular Movements: Extraocular movements intact.     Pupils: Pupils are equal, round, and reactive to light.  Cardiovascular:     Rate and Rhythm: Normal rate and regular rhythm.     Pulses: Normal pulses.     Heart sounds: Normal heart sounds.  Pulmonary:     Effort: Pulmonary effort is normal. No respiratory distress.     Breath sounds: Normal breath sounds. No stridor. No wheezing, rhonchi or rales.  Chest:     Chest wall: No tenderness.  Abdominal:     General: Abdomen is flat. There is no distension.     Palpations: Abdomen is soft.     Tenderness: There is no abdominal tenderness. There is no guarding or rebound.  Musculoskeletal:        General: No swelling or tenderness. Normal range of motion.     Cervical back: Normal range of motion and neck supple. No rigidity.     Right lower leg: No edema.     Left lower leg: No edema.  Skin:    General: Skin is warm and dry.     Capillary Refill: Capillary refill takes less than 2 seconds.     Coloration: Skin is  not pale.  Neurological:     General: No focal deficit present.     Mental Status: He is alert and oriented to person, place, and time.  Psychiatric:        Mood and Affect: Mood normal.        Behavior: Behavior normal.     ED Results / Procedures / Treatments   Labs (all labs ordered are listed, but only abnormal results are displayed) Labs Reviewed - No data to display  EKG EKG Interpretation  Date/Time:  Friday January 31 2020 11:38:33 EST Ventricular Rate:  86 PR Interval:  150 QRS Duration: 98 QT Interval:  384 QTC Calculation: 459 R Axis:   65 Text Interpretation: Normal sinus rhythm Normal ECG No significant change since last tracing Confirmed by Jacalyn Lefevre [phone]) on 01/31/2020 2:22:35 PM   Radiology No results found.  Procedures Procedures (including critical care time)  Medications Ordered in ED Medications  ketorolac (TORADOL) 30 MG/ML injection 30 mg (30 mg Intramuscular Given 01/31/20 1451)  ondansetron (ZOFRAN-ODT) disintegrating tablet 4 mg (4 mg Oral Given 01/31/20 1451)    ED Course  I have reviewed the triage vital signs and the nursing notes.  Pertinent labs & imaging results that were available during my care of the patient were reviewed by me and considered in my medical decision making (see chart for details).    MDM Rules/Calculators/A&P                          Paul Freeman is a 43 y.o. adult with pertinent past medical history of anxiety, depression, hypertension, TMJ, that presents emergency department today for right-sided jaw pain.  I think this is most likely worsening TMJ, no concerns for trigeminal neuralgia, no concerns for  dislocation.  Patient with normal range of motion, no crepitus palpated.  No numbness and tingling in face.  No facial droop.  Unlikely to be atypical ACS, symptoms for 3 weeks now, no cardiac history, no chest pain.  EKG sinus rhythm.  Shared decision making about further work-up at this time, discussed  that we will treat patient's pain here today for patient to follow-up with PCP, patient agreeable for plan.  Symptomatic treatment discussed, pain controlled with Toradol.  Zofran given for nausea, sure decision-making about steroids and prescription of Zofran at this time, he states that he will wait until he sees his PCP.  Patient to be discharged at this time.  Doubt need for further emergent work up at this time. I explained the diagnosis and have given explicit precautions to return to the ER including for any other new or worsening symptoms. The patient understands and accepts the medical plan as it's been dictated and I have answered their questions. Discharge instructions concerning home care and prescriptions have been given. The patient is STABLE and is discharged to home in good condition.   Final Clinical Impression(s) / ED Diagnoses Final diagnoses:  TMJ (temporomandibular joint syndrome)    Rx / DC Orders ED Discharge Orders         Ordered    naproxen (NAPROSYN) 375 MG tablet  2 times daily        01/31/20 1523           Alfredia Client, PA-C 01/31/20 1535    Isla Pence, MD 02/10/20 [phone]

## 2020-01-31 NOTE — Discharge Instructions (Addendum)
You are seen today for TMJ, please use the attached instructions.  There are instructions on the printout that will help you with your symptomatic treatment.  Please take the naproxen for the next 10 days, is warm for you to follow-up with your primary care.  Continue with a soft diet , if you have any new or worsening concerning symptoms please come back to the emergency department.  If your child becomes locked in a fixed position then come back to the emergency department.  Your blood pressure was slightly elevated today, its most likely due to pain, however when she did get this followed up with your primary care.  General instructions Take over-the-counter and prescription medicines only as told by your health care provider. If directed, put ice on the painful area. Put ice in a plastic bag. Place a towel between your skin and the bag. Leave the ice on for 20 minutes, 2-3 times a day. Apply a warm, wet cloth (warm compress) to the painful area as directed. Massage your jaw area and do any jaw stretching exercises as told by your health care provider. If you were given a splint, bite plate, or mouthpiece, wear it as told by your health care provider. Keep all follow-up visits as told by your health care provider. This is important.

## 2020-01-31 NOTE — ED Triage Notes (Signed)
Pt arrives with c/o right sided jaw pain, was seen at [facility] who did XRays, reports [facility] told him to follow up with neurology. Pt. Also c/o nausea and dizziness. Denies any CP. Pt has history of TMJ, unable to close jaw all the way. Pt states that this has been ongoing since tnd of November

## 2020-02-04 ENCOUNTER — Ambulatory Visit: Payer: No Typology Code available for payment source | Admitting: Internal Medicine

## 2020-02-04 ENCOUNTER — Other Ambulatory Visit: Payer: Self-pay

## 2020-02-04 ENCOUNTER — Encounter: Payer: Self-pay | Admitting: Internal Medicine

## 2020-02-04 VITALS — BP 120/80 | HR 80 | Temp 98.6°F | Ht 74.0 in | Wt 274.0 lb

## 2020-02-04 DIAGNOSIS — R6884 Jaw pain: Secondary | ICD-10-CM
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
## 2020-02-04 NOTE — Patient Instructions (Signed)
I think the best option is for you to see an oral surgeon regarding TMJ issues/right jaw issues.  We will make referral for you.

## 2020-03-14 ENCOUNTER — Other Ambulatory Visit: Payer: Self-pay | Admitting: Internal Medicine

## 2020-03-15 ENCOUNTER — Other Ambulatory Visit: Payer: Self-pay | Admitting: Internal Medicine

## 2020-03-26 ENCOUNTER — Ambulatory Visit: Payer: No Typology Code available for payment source | Admitting: Internal Medicine

## 2020-04-23 ENCOUNTER — Other Ambulatory Visit: Payer: Self-pay | Admitting: Internal Medicine

## 2020-04-24 ENCOUNTER — Other Ambulatory Visit: Payer: Self-pay

## 2020-04-24 ENCOUNTER — Ambulatory Visit: Payer: No Typology Code available for payment source | Admitting: Internal Medicine

## 2020-04-24 ENCOUNTER — Encounter: Payer: Self-pay | Admitting: Internal Medicine

## 2020-04-24 VITALS — BP 130/90 | HR 82 | Ht 74.0 in | Wt 272.0 lb

## 2020-04-24 DIAGNOSIS — R5383 Other fatigue: Secondary | ICD-10-CM | POA: Diagnosis not present

## 2020-04-24 DIAGNOSIS — Z8639 Personal history of other endocrine, nutritional and metabolic disease: Secondary | ICD-10-CM

## 2020-04-24 DIAGNOSIS — I10 Essential (primary) hypertension: Secondary | ICD-10-CM | POA: Diagnosis not present

## 2020-04-24 NOTE — Patient Instructions (Signed)
I would suggest you try to get a bit more sleep by turning in early at night rather than staying up later.  Try to walk some for exercise.  Please see Dr. Einar Gip for cardiac evaluation.  Medications have not been changed today.

## 2020-04-24 NOTE — Progress Notes (Signed)
Subjective:    Patient ID: Paul Freeman, male    DOB: 19-Apr-1975, 45 y.o.   MRN: [id number]  HPI 45 year old Male to discuss concerns about his health.  He would like to be screened for heart disease.  Dr. Jacinto Halim is his Cardiologist.  He has an appointment to see him on March 24.  He has had some fatigue but no frank chest pain.  He had CT coronary with CTA and calcium scoring in October 2020.  Coronary arteries showed right dominant with no abnormal lesions.  Coronary calcium score was 109.  He was considered high risk for future cardiac events.  He is on statin medication consisting of Crestor 10 mg daily.  Currently probably not getting enough exercise.  Stays up late at night and feels fatigued.  Children doing well.  Wife has situational stress with her job.  She has also had a urticaria seems to be stress related and has been under the care of an allergist.  He has a history of hypertension treated with lisinopril HCTZ 20/12.5 daily.  He also takes amlodipine 5 mg daily as well as metoprolol 100 mg daily.  He started Effexor 150 mg XR in November 2021.  He has a prescription for Valium on hand but this was last filled in early February.  This is to take for anxiety.  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  He would like to take a vacation with his wife.  Wife works from home for [employer].  He feels she has been under a lot of stress at work.  She also had COVID in 2021.  She is on chronic oxygen therapy.  Has a remote history of breast cancer.  He had lab work done July 2021 which was normal including TSH, CBC and lipid panel.  Total bilirubin was very slightly elevated at 1.4 otherwise c-Met was normal.  Review of Systems he denies chest pain or shortness of breath     Objective:   Physical Exam Blood pressure 130/90 pulse 82 pulse oximetry 96% weight 272 pounds BMI 34.92  Skin is warm and dry.  No cervical adenopathy.  Neck is supple.  No carotid bruits.  Chest is clear  to auscultation without rales or wheezing.  Cardiac exam: Regular rate and rhythm.  No lower extremity pitting edema.  Affect thought and judgment are normal.       Assessment & Plan:  Essential hypertension-she is on metoprolol 100 mg twice daily.  This may be contributing to his fatigue.  Will defer management to Dr. Jacinto Halim.  He is also on Zestoretic 20/12.5 in addition to amlodipine 5 mg daily.  History of suspected coronary disease with high calcium score in October 2020 of 109.  Fatigue -he stays up late at night and gets up fairly early.  Denies being depressed.  Some situational stress but less so than previously when I have seen him.  He will see Dr. Jacinto Halim for cardiac evaluation in the near future.  Testosterone level in July 2021 was low normal at 334.  He also takes 100 mg of metoprolol twice daily which may cause a little bit of fatigue.  Hyperlipidemia- treated with rosuvastatin 10 mg daily.  He is not fasting today so did not do fasting labs.  Lipid panel in July 2021 was normal.  Situational stress-discussed.  Wife was hospitalized with Covid and is now oxygen dependent with history of chemotherapy induced cardiomyopathy status post treatment for breast cancer.  Plan: He has upcoming appointment with Dr. Asa Lente late March for cardiac evaluation.  I think this is a good place to start in evaluating his fatigue.  Time spent speaking with patient about his past medical history, social history including family life, risk factors for cardiac disease and discussion about fatigue is 35 minutes

## 2020-04-27 ENCOUNTER — Telehealth: Payer: Self-pay | Admitting: Internal Medicine

## 2020-04-27 NOTE — Telephone Encounter (Signed)
Patient having rectal bleeding.  Father and mother both patients of mine and the patient called to get an appointment with me but was triaged to Dr. Ardis Hughs because he was seen by him previously.  Patient would like to see me given that I take care of the family.  I have discussed with Dr. Ardis Hughs and he is okay with change in provider.  Please call the patient and offer an 1130 or 3:50 PM appointment with me this week or 1130 on March 31  If none of those are acceptable there is a banding slot on April for the could be used let me know if you are still having problems but otherwise you can use a banding slot or and 1130 or 350 going forward from there  Please cancel pending appointment with Dr. Ardis Hughs

## 2020-04-28 NOTE — Telephone Encounter (Signed)
Patient has been scheduled to 05/07/20 at 11:30

## 2020-04-30 ENCOUNTER — Ambulatory Visit: Payer: No Typology Code available for payment source | Admitting: Cardiology

## 2020-04-30 ENCOUNTER — Other Ambulatory Visit: Payer: Self-pay

## 2020-04-30 ENCOUNTER — Encounter: Payer: Self-pay | Admitting: Cardiology

## 2020-04-30 VITALS — BP 125/76 | HR 73 | Temp 97.9°F | Resp 16 | Ht 74.0 in | Wt 274.6 lb

## 2020-04-30 DIAGNOSIS — I10 Essential (primary) hypertension: Secondary | ICD-10-CM

## 2020-04-30 DIAGNOSIS — R931 Abnormal findings on diagnostic imaging of heart and coronary circulation: Secondary | ICD-10-CM

## 2020-04-30 DIAGNOSIS — E785 Hyperlipidemia, unspecified: Secondary | ICD-10-CM

## 2020-04-30 NOTE — Progress Notes (Signed)
Primary Physician/Referring:  Margaree Mackintosh, MD  Patient ID: Paul Freeman, male    DOB: 09-18-1975, 45 y.o.   MRN: [id number]  Chief Complaint  Patient presents with  . Hypertension  . Coronary artery calcium score   . Follow-up   HPI:    Paul Freeman  is a 45 y.o. Caucasian male with hypertension, extreme anxiety, mild  G6PD deficiency, who has responded well to blood pressure control with lisinopril HCT, HCT utilized in spite of G6PD deficiency at a low dose which he is tolerating for years now. BP without lisinopril HCT was uncontrolled.  Due to  atypical chest pain and elevated coronary calcium score that was done in 2018, he underwent coronary CT angiogram on 12/07/2018 revealing chronic calcium score of 83 in the 94th percentile, proximal LAD less than 50% stenosis not hemodynamically significant.  In view of this is now on a statin and also metoprolol.  He now presents for annual visit.  Fortunately he has not had any further episodes of chest pain.  He continues to be extremely anxious at times, states that he also feels fatigued although he only sleeps for 6 hours a day and sleep study have been negative for any significant hypoxemia or sleep apnea.  However he also works from 6 AM and goes to bed at 12 PM.  Past Medical History:  Diagnosis Date  . Anxiety   . Depression   . Hypertension   . Mild hyperlipidemia   . Pneumonia    Past Surgical History:  Procedure Laterality Date  . APPENDECTOMY    . CYSTECTOMY  2008   cyst removal from left armpit   Social History   Tobacco Use  . Smoking status: Former Smoker    Packs/day: 0.50    Years: 24.00    Pack years: 12.00    Quit date: 2013    Years since quitting: 9.2  . Smokeless tobacco: Never Used  Substance Use Topics  . Alcohol use: Yes    Comment: occ  Marital Status: Married    ROS  Review of Systems  Cardiovascular: Negative for chest pain, dyspnea on exertion and leg swelling.  Gastrointestinal:  Positive for constipation. Negative for melena.  Psychiatric/Behavioral: The patient is nervous/anxious.    Objective   Vitals with BMI 04/30/2020 04/24/2020 02/04/2020  Height 6\' 2"  6\' 2"  6\' 2"   Weight 274 lbs 10 oz 272 lbs 274 lbs  BMI 35.24 34.91 35.16  Systolic 125 130 409  Diastolic 76 90 80  Pulse 73 82 80    Blood pressure 125/76, pulse 73, temperature 97.9 F (36.6 C), temperature source Temporal, resp. rate 16, height 6\' 2"  (1.88 m), weight 274 lb 9.6 oz (124.6 kg), SpO2 96 %. Body mass index is 35.26 kg/m.   Physical Exam Constitutional:      Comments: He is well-built and moderately obese in no acute distress.  HENT:     Head: Atraumatic.  Eyes:     Conjunctiva/sclera: Conjunctivae normal.  Neck:     Thyroid: No thyromegaly.     Vascular: No JVD.  Cardiovascular:     Rate and Rhythm: Normal rate and regular rhythm.     Pulses: Intact distal pulses.     Heart sounds: Normal heart sounds. No murmur heard. No gallop.   Pulmonary:     Effort: Pulmonary effort is normal.     Breath sounds: Normal breath sounds.  Abdominal:     General: Bowel sounds are normal.  Palpations: Abdomen is soft.  Musculoskeletal:        General: Normal range of motion.     Cervical back: Neck supple.  Skin:    General: Skin is warm and dry.  Neurological:     Mental Status: He is alert.    Radiology: No results found.  Laboratory examination:   G6PD deficiency 01/25/18: G-6-PD, Quant 4.6 - 13.5 U/g Hb <.3Low    Recent Labs    09/06/19 0948  NA 141  K 3.8  CL 103  CO2 29  GLUCOSE 99  BUN 14  CREATININE 0.97  CALCIUM 9.6  GFRNONAA 95  GFRAA 110   CMP Latest Ref Rng & Units 09/20/2019 09/06/2019 11/28/2018  Glucose 65 - 99 mg/dL - 99 478(G)  BUN 7 - 25 mg/dL - 14 10  Creatinine 9.56 - 1.35 mg/dL - 2.13 0.86  Sodium 578 - 146 mmol/L - 141 140  Potassium 3.5 - 5.3 mmol/L - 3.8 4.0  Chloride 98 - 110 mmol/L - 103 102  CO2 20 - 32 mmol/L - 29 25  Calcium 8.6 - 10.3  mg/dL - 9.6 9.2  Total Protein 6.1 - 8.1 g/dL - 7.2 -  Total Bilirubin 0.2 - 1.2 mg/dL 1.0 4.6(N) -  Alkaline Phos 39 - 117 U/L - - -  AST 10 - 40 U/L - 28 -  ALT 9 - 46 U/L - 41 -   CBC Latest Ref Rng & Units 09/06/2019 09/17/2018 01/25/2018  WBC 3.8 - 10.8 Thousand/uL 6.2 6.2 -  Hemoglobin 13.2 - 17.1 g/dL 62.9 52.8 41.3  Hematocrit 38.5 - 50.0 % 44.3 42.6 -  Platelets 140 - 400 Thousand/uL 205 182 -   Lipid Panel     Component Value Date/Time   CHOL 118 09/06/2019 0948   TRIG 80 09/06/2019 0948   HDL 49 09/06/2019 0948   CHOLHDL 2.4 09/06/2019 0948   VLDL 24.0 04/18/2011 0904   LDLCALC 53 09/06/2019 0948   HEMOGLOBIN A1C No results found for: HGBA1C, MPG TSH Recent Labs    09/06/19 0948  TSH 0.45   Medications and allergies   Allergies  Allergen Reactions  . Penicillins Other (See Comments)    UNKNOWN/childhood     Current Outpatient Medications on File Prior to Visit  Medication Sig Dispense Refill  . amLODipine (NORVASC) 5 MG tablet TAKE 1 TABLET (5 MG TOTAL) BY MOUTH DAILY. NEEDS TO SCHEDULE APPT FOR REFILLS. 90 tablet 1  . cetirizine (ZYRTEC) 10 MG tablet TAKE 1 TABLET BY MOUTH EVERY DAY 90 tablet 3  . Coenzyme Q10 (COQ10 PO) Take by mouth daily.    . diazepam (VALIUM) 10 MG tablet TAKE 1/2 TO 1 TABLET BY MOUTH 2-3 TIMES DAILY AS NEEDED 90 tablet 1  . ketoconazole (NIZORAL) 2 % shampoo Apply 1 application topically 2 (two) times a week. 120 mL prn  . lisinopril-hydrochlorothiazide (ZESTORETIC) 20-12.5 MG tablet TAKE 1 TABLET BY MOUTH EVERY DAY 90 tablet 1  . methocarbamol (ROBAXIN) 500 MG tablet TAKE 1 TABLET (500 MG TOTAL) BY MOUTH 2 (TWO) TIMES DAILY AS NEEDED FOR MUSCLE SPASMS. 60 tablet 5  . metoprolol tartrate (LOPRESSOR) 100 MG tablet TAKE 1 TABLET BY MOUTH TWICE A DAY 180 tablet 1  . Multiple Vitamins-Minerals (MULTIVITAMIN ADULT PO) Take by mouth daily. Vitamin pack    . naproxen (NAPROSYN) 375 MG tablet Take 1 tablet (375 mg total) by mouth 2 (two) times  daily. 20 tablet 0  . rosuvastatin (CRESTOR) 10 MG tablet TAKE  1 TABLET BY MOUTH EVERY DAY 90 tablet 3  . venlafaxine XR (EFFEXOR-XR) 150 MG 24 hr capsule TAKE 1 CAPSULE (150 MG TOTAL) BY MOUTH DAILY WITH BREAKFAST. 90 capsule 2   No current facility-administered medications on file prior to visit.     Cardiac Studies:   Coronary calcium score 05/10/2016: Calcium score of 83.6, 94 percentile for subjects in the same age group. Based on age 10 years. Old granular  disease.   Treadmill exercise stress test 02/24/2017: Indication: Screening for CAD, hypertension The patient exercised on Bruce protocol for 9:44 min. Patient achieved 11.36 METS and reached HR 185 bpm, which is 102 % of maximum age-predicted HR. Stress test terminated due to fatigue. Resting EKG demonstrates Normal sinus rhythm. ST Changes: With peak exercise there was no ST-T changes of ischemia.   Chest Pain: none. BP Response to Exercise: Normal resting BP- appropriate response. Hypertensive at rest 164/102 mm Hg. However in recovery, patient developed severe hypotension with BP 80/60 mm Hg. Took 10 minutes before he recuperated with normal BP and dizziness subsided. No arrhythmias. Rec: Patient's BP response is probably normal but exaggerated response with vasodilatation post exercise. Excellent effort without chest pain or dyspnea.  Recommend echocardiogram to evaluate LV function.  Echo- 02/28/2017 1. Left ventricle cavity is normal in size. Mild concentric hypertrophy of the left ventricle. Normal global wall motion. Normal diastolic filling pattern. Calculated EF 59%. 2. Left atrial cavity is mildly dilated. 3. Mild (Grade I) mitral regurgitation. 4. Mild tricuspid regurgitation. No evidence of pulmonary hypertension. 5. IVC is dilated with respiratory variation.  Coronary CT angiogram 12/07/2018: 1. Coronary calcium score of 83.6. Calcium score is 94 percentile for subjects of the same age, gender and race/ethnicity.  Please note that the MESA database starts at age 30. 2. Probably mild <50% ostial and proximal LAD stenosis. There was no evidence for hemodynamically significant stenosis by FFR. Right dominant circulation, normal circumflex and right coronary artery. 3.  No significant extracardiac abnormality.  EKG:   EKG 04/30/2020: Normal sinus rhythm at rate of 65 bpm, normal axis.  No evidence of ischemia, normal EKG.  No significant change from 11/12/2018.  Assessment     ICD-10-CM   1. Agatston coronary artery calcium score less than 100  R93.1   2. Essential hypertension  I10 EKG 12-Lead  3. Mild hyperlipidemia  E78.5    No orders of the defined types were placed in this encounter. There are no discontinued medications.  Orders Placed This Encounter  Procedures  . EKG 12-Lead    Recommendations:   DELSHAUN MONTIEL is a 45 y.o. Caucasian male with hypertension, extreme anxiety, mild  G6PD deficiency, who has responded well to blood pressure control with lisinopril HCT, HCT utilized in spite of G6PD deficiency at a low dose which he is tolerating for years now. BP without lisinopril HCT was uncontrolled.  Due to  atypical chest pain and elevated coronary calcium score that was done in 2018, he underwent coronary CT angiogram on 12/07/2018 revealing chronic calcium score of 83 in the 94th percentile, proximal LAD less than 50% stenosis not hemodynamically significant.  In view of this is now on a statin and also metoprolol.  He now presents for annual visit.  I reviewed his data again with the patient, he is on appropriate medical therapy, with excellent control of blood pressure, lipids are also under excellent control.  In view of MeSA score at 94th percentile for age and sex matched individual with  regard to coronary calcium score, he is at extreme high risk for future cardiovascular events.  I have discussed with him regarding relaxation techniques.  Also discussed regarding taking a brief  nap in the afternoon for 30 minutes.  With regard to obesity, discussed regarding using Metamucil 1 teaspoon full 30 minutes prior to his dinner which has been his main meal for the day.  This would also help with his constipation.  Otherwise stable from cardiac standpoint, I will see him back in a year.   Yates Decamp, MD, [facility] 04/30/2020, 6:17 PM Office: [phone] Pager: [phone]    CC: Sharlet Salina, MD; [name], MD (Patient sees both of them).

## 2020-05-07 ENCOUNTER — Encounter: Payer: Self-pay | Admitting: Internal Medicine

## 2020-05-07 ENCOUNTER — Other Ambulatory Visit: Payer: Self-pay

## 2020-05-07 ENCOUNTER — Ambulatory Visit (INDEPENDENT_AMBULATORY_CARE_PROVIDER_SITE_OTHER): Payer: No Typology Code available for payment source | Admitting: Internal Medicine

## 2020-05-07 VITALS — BP 110/82 | HR 80 | Ht 74.0 in | Wt 271.4 lb

## 2020-05-07 DIAGNOSIS — K601 Chronic anal fissure: Secondary | ICD-10-CM | POA: Diagnosis not present

## 2020-05-07 MED ORDER — AMBULATORY NON FORMULARY MEDICATION: 3 refills | Status: DC

## 2020-05-07 NOTE — Patient Instructions (Signed)
We have given you a fissure handout to read today.  Take 1-2 tablespoons of benefiber daily, handout provided.   Your provider has prescribed Diltizem / lidocaine  for you. Please follow the directions written on your prescription bottle or given to you specifically by your provider. Since this is a specialty medication and is not readily available at most local pharmacies, we have sent your prescription to:  [facility] information is below: Address: [address], [place], [place] [address]  [phone]  *Please DO NOT go directly from our office to pick up this medication! Give the pharmacy 1 day to process the prescription as this is compounded and takes time to make.   I appreciate the opportunity to care for you. Silvano Rusk, MD, [facility]

## 2020-05-07 NOTE — Progress Notes (Signed)
Paul Freeman 45 y.o. November 02, 1975 [id number]  Assessment & Plan:   Encounter Diagnosis  Name Primary?  . Chronic anterior anal fissure Yes   Treat with diltiazem lidocaine gel. Benefiber 1 to 2 tablespoons daily Follow-up in 2 months Meds ordered this encounter  Medications  . AMBULATORY NON FORMULARY MEDICATION    Sig: Medication Name: Diltiazem 2% gel mixed with Lidocaine 5% Sig: Apply a pea size amount to rectum twice a day.    Dispense:  30 g    Refill:  3    I had mentioned but forgotten to provide information about solutions to spray on toilet paper to convert them into wet wipes.  I will message him about this.  I appreciate the opportunity to care for this patient. CC: Margaree Mackintosh, MD   Subjective:   Chief Complaint: Rectal bleeding  HPI The patient is a 45 year old white man that has had some chronic intermittent rectal bleeding over the years, but has been having it more frequently and a couple times lately it was "a lot of blood".  He has very large stools and he has sharp pains when the stool passes through the rectum and anal canal.  He always use wet wipes to cleanse.  Usually 1 stool a day but again a large amount and wide stools.  It is always on the hard side.  He is wondering if he needs a colonoscopy.  He was seen in 2020 and he had a thrombosed prolapsed external hemorrhoid.  Saw Dr. Christella Hartigan.  The patient does occasionally use Preparation H if he has a bunch of loose stools causing anal irritation.   Allergies  Allergen Reactions  . Penicillins Other (See Comments)    UNKNOWN/childhood    Current Meds  Medication Sig  . AMBULATORY NON FORMULARY MEDICATION Medication Name: Diltiazem 2% gel mixed with Lidocaine 5% Sig: Apply a pea size amount to rectum twice a day.  Marland Kitchen amLODipine (NORVASC) 5 MG tablet TAKE 1 TABLET (5 MG TOTAL) BY MOUTH DAILY. NEEDS TO SCHEDULE APPT FOR REFILLS.  Marland Kitchen cetirizine (ZYRTEC) 10 MG tablet TAKE 1 TABLET BY MOUTH EVERY  DAY  . Coenzyme Q10 (COQ10 PO) Take by mouth daily.  . diazepam (VALIUM) 10 MG tablet TAKE 1/2 TO 1 TABLET BY MOUTH 2-3 TIMES DAILY AS NEEDED  . ketoconazole (NIZORAL) 2 % shampoo Apply 1 application topically 2 (two) times a week.  Marland Kitchen lisinopril-hydrochlorothiazide (ZESTORETIC) 20-12.5 MG tablet TAKE 1 TABLET BY MOUTH EVERY DAY  . methocarbamol (ROBAXIN) 500 MG tablet TAKE 1 TABLET (500 MG TOTAL) BY MOUTH 2 (TWO) TIMES DAILY AS NEEDED FOR MUSCLE SPASMS.  . metoprolol tartrate (LOPRESSOR) 100 MG tablet TAKE 1 TABLET BY MOUTH TWICE A DAY  . Multiple Vitamins-Minerals (MULTIVITAMIN ADULT PO) Take by mouth daily. Vitamin pack  . naproxen (NAPROSYN) 375 MG tablet Take 1 tablet (375 mg total) by mouth 2 (two) times daily.  . rosuvastatin (CRESTOR) 10 MG tablet TAKE 1 TABLET BY MOUTH EVERY DAY  . venlafaxine XR (EFFEXOR-XR) 150 MG 24 hr capsule TAKE 1 CAPSULE (150 MG TOTAL) BY MOUTH DAILY WITH BREAKFAST.   Past Medical History:  Diagnosis Date  . Anxiety   . Depression   . Hypertension   . Mild hyperlipidemia   . Pneumonia    Past Surgical History:  Procedure Laterality Date  . APPENDECTOMY    . 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   Review of Systems   Objective:   Physical Exam BP 110/82   Pulse 80   Ht 6\' 2"  (1.88 m)   Wt 271 lb 6.4 oz (123.1 kg)   SpO2 98%   BMI 34.85 kg/m  Well-developed well-nourished obese white man no acute distress Lungs clear Heart sounds are normal S1-S2 no rubs murmurs or  gallops The abdomen is soft nontender without organomegaly or mass  Rectal exam reveals an obvious sentinel pile anterior with a fissure.  Digital exam is tender with spasm and stenosis.  Prostate is normal see photos

## 2020-05-18 ENCOUNTER — Ambulatory Visit: Payer: No Typology Code available for payment source | Admitting: Internal Medicine

## 2020-06-17 ENCOUNTER — Ambulatory Visit: Payer: No Typology Code available for payment source | Admitting: Gastroenterology

## 2020-06-17 DIAGNOSIS — Z0289 Encounter for other administrative examinations: Secondary | ICD-10-CM

## 2020-06-18 ENCOUNTER — Telehealth: Payer: Self-pay | Admitting: Internal Medicine

## 2020-06-18 NOTE — Telephone Encounter (Signed)
Faxed 35 pages of Medical Records thru release to [facility] for [employer]

## 2020-07-24 ENCOUNTER — Ambulatory Visit: Payer: No Typology Code available for payment source | Admitting: Internal Medicine

## 2020-07-24 ENCOUNTER — Telehealth: Payer: Self-pay

## 2020-07-24 NOTE — Telephone Encounter (Signed)
No show letter mailed to patient. 

## 2020-07-29 ENCOUNTER — Other Ambulatory Visit: Payer: Self-pay | Admitting: Cardiology

## 2020-07-29 DIAGNOSIS — I10 Essential (primary) hypertension: Secondary | ICD-10-CM

## 2020-08-09 ENCOUNTER — Other Ambulatory Visit: Payer: Self-pay | Admitting: Internal Medicine

## 2020-09-11 ENCOUNTER — Other Ambulatory Visit: Payer: Self-pay

## 2020-09-11 ENCOUNTER — Ambulatory Visit: Payer: No Typology Code available for payment source | Admitting: Internal Medicine

## 2020-09-11 ENCOUNTER — Encounter: Payer: Self-pay | Admitting: Internal Medicine

## 2020-09-11 ENCOUNTER — Telehealth: Payer: Self-pay | Admitting: Cardiology

## 2020-09-11 VITALS — BP 102/80 | HR 88 | Ht 74.0 in | Wt 269.0 lb

## 2020-09-11 DIAGNOSIS — Z0289 Encounter for other administrative examinations: Secondary | ICD-10-CM | POA: Diagnosis not present

## 2020-09-12 ENCOUNTER — Other Ambulatory Visit: Payer: Self-pay | Admitting: Internal Medicine

## 2020-09-14 ENCOUNTER — Encounter: Payer: Self-pay | Admitting: Cardiology

## 2020-10-08 DIAGNOSIS — Z0289 Encounter for other administrative examinations: Secondary | ICD-10-CM

## 2020-10-18 ENCOUNTER — Encounter: Payer: Self-pay | Admitting: Internal Medicine

## 2020-10-18 NOTE — Patient Instructions (Addendum)
Patient was seen and examined.  Form completed for DOT certificate as requested.  He needs to have health maintenance exam in the next few months.

## 2020-10-18 NOTE — Progress Notes (Signed)
Subjective:    Patient ID: NIKOLAUS MALECKI, male    DOB: 1975-02-16, 45 y.o.   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  He was seen at [facility] health location recently for DOT physical exam.  He does take Valium for anxiety and has done this for a number of years.  He also takes Effexor for anxiety and depression.  He has essential hypertension treated with amlodipine, metoprolol and Zestoretic.  He takes Crestor for hyperlipidemia.  History of generalized anxiety disorder.  Form needs to be completed in order for him to have his DOT certificate reviewed.  He takes Valium 10 mg 1/2 to 1 tablet 2-3 times daily as needed.  He takes his medication responsibly and in my opinion he is not addicted to it but it helps with his anxiety disorder and takes his temperature check.  A form was completed regarding this.    Review of Systems no new complaints and feels well     Objective:   Physical Exam Blood pressure 102/80 pulse 88 pulse oximetry 97% weight 269 pounds height 6 feet 2 inches BMI 34.54  Neck is supple.  No carotid bruits.  No thyromegaly.  Chest clear.  Cardiac exam: Regular rate and rhythm.  No ectopy.  No lower extremity edema.  Affect thought and judgment are normal.     Assessment & Plan:  Patient takes Valium for generalized anxiety disorder and uses medication responsibly and takes it correctly.  He has a history of hypertension which is stable  Plan: Form completed for DOT certificate.  He is also followed by Dr. Jacinto Halim, cardiologist.  He needs health maintenance exam here in the next few months.

## 2020-10-23 ENCOUNTER — Other Ambulatory Visit: Payer: Self-pay | Admitting: Internal Medicine

## 2020-10-31 ENCOUNTER — Other Ambulatory Visit: Payer: Self-pay | Admitting: Internal Medicine

## 2020-11-17 ENCOUNTER — Other Ambulatory Visit: Payer: Self-pay | Admitting: Internal Medicine

## 2021-01-08 DIAGNOSIS — Z0289 Encounter for other administrative examinations: Secondary | ICD-10-CM

## 2021-01-20 ENCOUNTER — Other Ambulatory Visit: Payer: Self-pay | Admitting: Internal Medicine

## 2021-01-20 DIAGNOSIS — I10 Essential (primary) hypertension: Secondary | ICD-10-CM

## 2021-01-30 ENCOUNTER — Encounter: Payer: Self-pay | Admitting: Internal Medicine

## 2021-02-11 ENCOUNTER — Encounter: Payer: Self-pay | Admitting: Cardiology

## 2021-02-11 ENCOUNTER — Ambulatory Visit: Payer: No Typology Code available for payment source | Admitting: Cardiology

## 2021-02-11 ENCOUNTER — Telehealth: Payer: Self-pay | Admitting: Cardiology

## 2021-02-11 DIAGNOSIS — I10 Essential (primary) hypertension: Secondary | ICD-10-CM

## 2021-02-11 DIAGNOSIS — R0609 Other forms of dyspnea: Secondary | ICD-10-CM

## 2021-02-11 DIAGNOSIS — R072 Precordial pain: Secondary | ICD-10-CM

## 2021-02-11 DIAGNOSIS — E7800 Pure hypercholesterolemia, unspecified: Secondary | ICD-10-CM

## 2021-02-11 NOTE — Telephone Encounter (Signed)
Patient couldn't make today's appointment (follow up for foot pain) due to work conflict. He is requesting to have an order put in for an echo and wondering is he needs to have a CT done as well. I would be happy to inform him either way, and reschedule him for his follow up, should he still be experiencing these symptoms.

## 2021-02-12 NOTE — Telephone Encounter (Signed)
Patient couldn't make yesterday's appointment (follow up for foot pain) due to work conflict. He is requesting to have an order put in for an echo and wondering is he needs to have a CT done as well. I would be happy to inform him either way, and reschedule him for his follow up, should he still be experiencing these symptoms.

## 2021-02-17 ENCOUNTER — Encounter: Payer: Self-pay | Admitting: Cardiology

## 2021-02-19 NOTE — Telephone Encounter (Signed)
Can you please call him and ask him if he is having any symptoms of shortness of breath or chest pain.  I left a message for him on his cell phone and also sent MyChart message.  Depending upon his symptomatology, I will order further testing.  He has not had recent labs, unless he has had it somewhere else, I can also place lab orders for him.  Please reconcile his medications when you call him.

## 2021-02-22 NOTE — Telephone Encounter (Signed)
Called and spoke to pt, pt stated that he gets SOB and chest tightness when walking up a short flight of stairs. He stated he does not believe this is normal. He said he will check my chart and that you can respond to him there.  Called Pts PCP and they stated that he has not had labs since 09/2019. Pt stated he has not had labs done anywhere else.

## 2021-02-23 NOTE — Telephone Encounter (Signed)
Please set up OV in 1 month and I have ordered coronary ct angiogram and echo and lab orders placed.

## 2021-02-23 NOTE — Telephone Encounter (Signed)
ICD-10-CM   1. Precordial pain  R07.2 CT CORONARY MORPH W/CTA COR W/SCORE W/CA W/CM &/OR WO/CM    PCV ECHOCARDIOGRAM COMPLETE    2. Dyspnea on exertion  R06.09 CT CORONARY MORPH W/CTA COR W/SCORE W/CA W/CM &/OR WO/CM    PCV ECHOCARDIOGRAM COMPLETE    3. Primary hypertension  I10 CMP14+EGFR    Hgb A1c w/o eAG    TSH    CBC    4. Hypercholesteremia  E78.00 Lipid Panel With LDL/HDL Ratio     Orders Placed This Encounter  Procedures   CT CORONARY MORPH W/CTA COR W/SCORE W/CA W/CM &/OR WO/CM    Standing Status:   Future    Standing Expiration Date:   05/24/2021    Order Specific Question:   If indicated for the ordered procedure, I authorize the administration of contrast media per Radiology protocol    Answer:   Yes    Order Specific Question:   Preferred Imaging Location?    Answer:   [facility]   CMP14+EGFR   Hgb A1c w/o eAG   Lipid Panel With LDL/HDL Ratio   TSH   CBC   PCV ECHOCARDIOGRAM COMPLETE    Standing Status:   Future    Standing Expiration Date:   02/23/2022     Adrian Prows, MD, [facility] 02/23/2021, 5:26 PM Office: [phone] Fax: [phone] Pager: [phone]

## 2021-02-24 NOTE — Telephone Encounter (Signed)
Called and spoke to pt, pt voiced understanding and will get tests done prior to next office visit. Transferred pt up front, they scheduled him for an appt with [place] 04/01/2021 at 11:00 am.

## 2021-03-05 ENCOUNTER — Telehealth (HOSPITAL_COMMUNITY): Payer: Self-pay | Admitting: *Deleted

## 2021-03-05 ENCOUNTER — Telehealth: Payer: Self-pay | Admitting: Cardiology

## 2021-03-05 NOTE — Telephone Encounter (Signed)
Attempted to call patient regarding upcoming cardiac CT appointment. Mailbox is full and unable to leave a message.  Gordy Clement RN Navigator Cardiac Imaging [facility] Heart and V[facility] Services [phone] Office [phone] Cell

## 2021-03-05 NOTE — Telephone Encounter (Signed)
Patient advised to take 150 mg of Metroprolol tartrate in the evening day before the CT angiogram, and to take 200 mg 2 hours prior to coronary CT angiogram.  Patient generally takes 100 mg twice daily.  Patient acknowledged that he has received the instructions.

## 2021-03-08 ENCOUNTER — Encounter (HOSPITAL_COMMUNITY): Payer: Self-pay

## 2021-03-08 ENCOUNTER — Other Ambulatory Visit: Payer: Self-pay

## 2021-03-08 ENCOUNTER — Ambulatory Visit (HOSPITAL_COMMUNITY)
Admission: RE | Admit: 2021-03-08 | Discharge: 2021-03-08 | Disposition: A | Discharge status: Home / Self Care | Payer: No Typology Code available for payment source | Source: Ambulatory Visit | Attending: Cardiology | Admitting: Cardiology

## 2021-03-08 DIAGNOSIS — R0609 Other forms of dyspnea: Secondary | ICD-10-CM | POA: Diagnosis present

## 2021-03-08 DIAGNOSIS — R072 Precordial pain: Secondary | ICD-10-CM | POA: Diagnosis not present

## 2021-03-08 LAB — POCT I-STAT CREATININE: Creatinine, Ser: 1 mg/dL (ref 0.61–1.24)

## 2021-03-08 MED ORDER — NITROGLYCERIN 0.4 MG SL SUBL
0.8000 mg | SUBLINGUAL_TABLET | Freq: Once | SUBLINGUAL | Status: AC
  Administered 2021-03-08: 0.8 mg via SUBLINGUAL

## 2021-03-08 MED ORDER — NITROGLYCERIN 0.4 MG SL SUBL
SUBLINGUAL_TABLET | SUBLINGUAL | Status: AC
  Filled 2021-03-08: qty 2

## 2021-03-08 MED ORDER — IOHEXOL 350 MG/ML SOLN
95.0000 mL | Freq: Once | INTRAVENOUS | Status: AC | PRN
  Administered 2021-03-08: 95 mL via INTRAVENOUS

## 2021-03-09 ENCOUNTER — Other Ambulatory Visit: Payer: Self-pay | Admitting: Internal Medicine

## 2021-03-09 DIAGNOSIS — Z0289 Encounter for other administrative examinations: Secondary | ICD-10-CM

## 2021-03-09 LAB — HGB A1C W/O EAG: Hgb A1c MFr Bld: 4.9 % (ref 4.8–5.6)

## 2021-03-09 LAB — CMP14+EGFR
ALT: 27 IU/L (ref 0–44)
AST: 20 IU/L (ref 0–40)
Albumin/Globulin Ratio: 2.1 (ref 1.2–2.2)
Albumin: 4.8 g/dL (ref 4.0–5.0)
Alkaline Phosphatase: 79 IU/L (ref 44–121)
BUN/Creatinine Ratio: 9 (ref 9–20)
BUN: 9 mg/dL (ref 6–24)
Bilirubin Total: 0.9 mg/dL (ref 0.0–1.2)
CO2: 29 mmol/L (ref 20–29)
Calcium: 9.8 mg/dL (ref 8.7–10.2)
Chloride: 100 mmol/L (ref 96–106)
Creatinine, Ser: 0.96 mg/dL (ref 0.76–1.27)
Globulin, Total: 2.3 g/dL (ref 1.5–4.5)
Glucose: 105 mg/dL — ABNORMAL HIGH (ref 70–99)
Potassium: 4.1 mmol/L (ref 3.5–5.2)
Sodium: 142 mmol/L (ref 134–144)
Total Protein: 7.1 g/dL (ref 6.0–8.5)
eGFR: 100 mL/min/{1.73_m2} (ref >59)

## 2021-03-09 LAB — LIPID PANEL WITH LDL/HDL RATIO
Cholesterol, Total: 128 mg/dL (ref 100–199)
HDL: 46 mg/dL (ref >39)
LDL Chol Calc (NIH): 61 mg/dL (ref 0–99)
LDL/HDL Ratio: 1.3 ratio (ref 0.0–3.6)
Triglycerides: 117 mg/dL (ref 0–149)
VLDL Cholesterol Cal: 21 mg/dL (ref 5–40)

## 2021-03-09 LAB — CBC
Hematocrit: 45.4 % (ref 37.5–51.0)
Hemoglobin: 15.4 g/dL (ref 13.0–17.7)
MCH: 30.1 pg (ref 26.6–33.0)
MCHC: 33.9 g/dL (ref 31.5–35.7)
MCV: 89 fL (ref 79–97)
Platelets: 213 10*3/uL (ref 150–450)
RBC: 5.11 x10E6/uL (ref 4.14–5.80)
RDW: 11.6 % (ref 11.6–15.4)
WBC: 7 10*3/uL (ref 3.4–10.8)

## 2021-03-09 LAB — TSH: TSH: 0.511 u[IU]/mL (ref 0.450–4.500)

## 2021-03-09 NOTE — Progress Notes (Signed)
FYI. He also had coronary CT angio. Result pending

## 2021-03-10 ENCOUNTER — Ambulatory Visit (HOSPITAL_COMMUNITY)
Admission: RE | Admit: 2021-03-10 | Discharge: 2021-03-10 | Disposition: A | Discharge status: Home / Self Care | Payer: No Typology Code available for payment source | Source: Ambulatory Visit | Attending: Cardiology | Admitting: Cardiology

## 2021-03-10 ENCOUNTER — Other Ambulatory Visit: Payer: Self-pay | Admitting: Cardiology

## 2021-03-10 DIAGNOSIS — R072 Precordial pain: Secondary | ICD-10-CM | POA: Diagnosis not present

## 2021-03-10 NOTE — Progress Notes (Signed)
Coronary CTA 03/10/2021: LM 0 LAD 143 LCx 0 RCA 7.17. Total coronary calcium score 150.17.  [place] not available for age <45.  Assuming 45 years of age, patient is in 8 percentile for gender and race matched individual. LM: Normal LAD: Large vessel wraps around the apex, moderate 50 to 69% stenosis due to eccentric mixed plaque in the proximal/mid LAD. LCx normal, 2 large obtuse marginals, no stenosis. RCA: Large vessel, no significant plaque burden. LV grossly normal in size and no stigmata of prior infarction.  Aorta is normal in measurement, no significant atherosclerotic changes.  Aorta is normal in size. Visualized noncardiac structures within normal limits.

## 2021-03-10 NOTE — Progress Notes (Signed)
Coronary CTA 03/10/2021: LM 0 LAD 143 LCx 0 RCA 7.17. Total coronary calcium score 150.17.  [place] not available for age <45.  Assuming 46 years of age, patient is in 8 percentile for gender and race matched individual. LM: Normal LAD: Large vessel wraps around the apex, moderate 50 to 69% stenosis due to eccentric mixed plaque in the proximal/mid LAD.  CT FFR 0.99, not hemodynamically significant. LCx normal, 2 large obtuse marginals, no stenosis. RCA: Large vessel, no significant plaque burden. LV grossly normal in size and no stigmata of prior infarction.  Aorta is normal in measurement, no significant atherosclerotic changes.  Aorta is normal in size. Visualized noncardiac structures within normal limits.

## 2021-03-25 IMAGING — CT CT CERVICAL SPINE W/O CM
3 of 4 series · 14 of 33 positions shown, 17 images · non-contrast
Comparison: None.

CLINICAL DATA: Neck pain with right fingers tingling.

EXAM:
CT CERVICAL SPINE WITHOUT CONTRAST
TECHNIQUE: Multidetector CT imaging of the cervical spine was performed without
intravenous contrast. Multiplanar CT image reconstructions were also
generated.

[Series 4: sagittal bone · sagittal · 0.34mm/px · 5 of 83 slices shown, 6 images]
[im 28/83  bone]
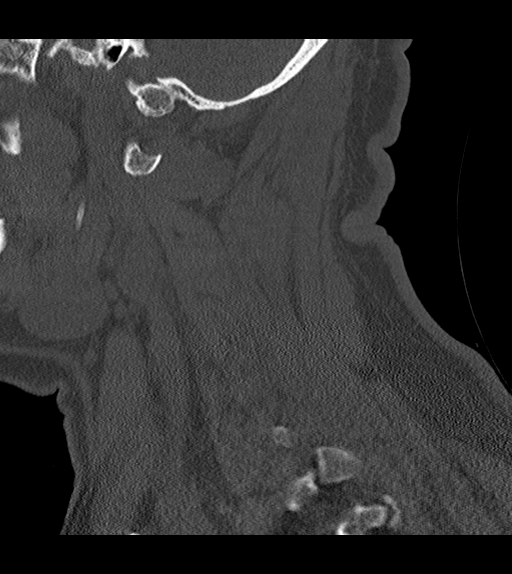
[im 35/83  bone]
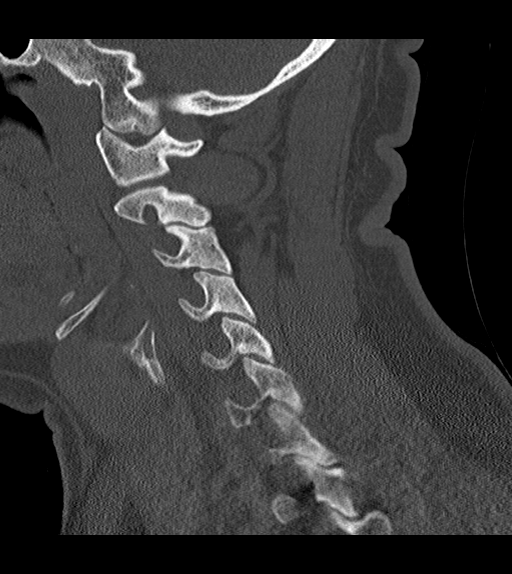
[im 42/83  soft-tissue]
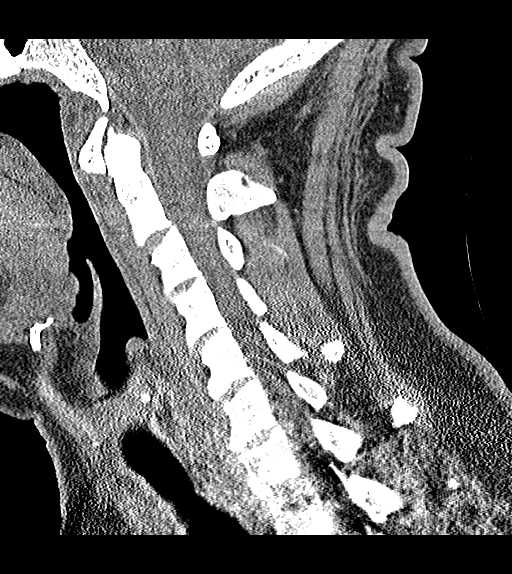
[im 42/83  bone]
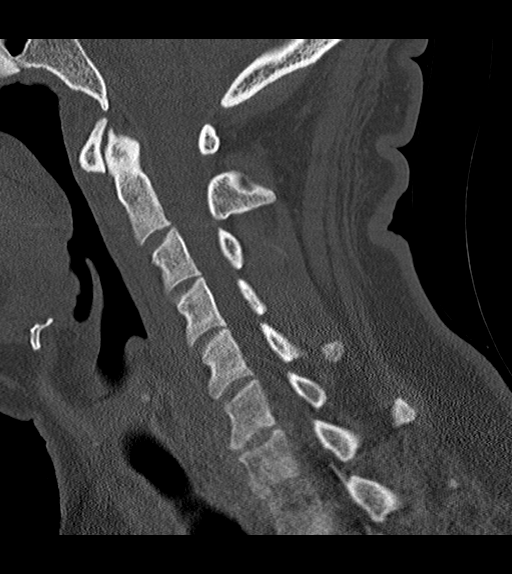
[im 48/83  bone]
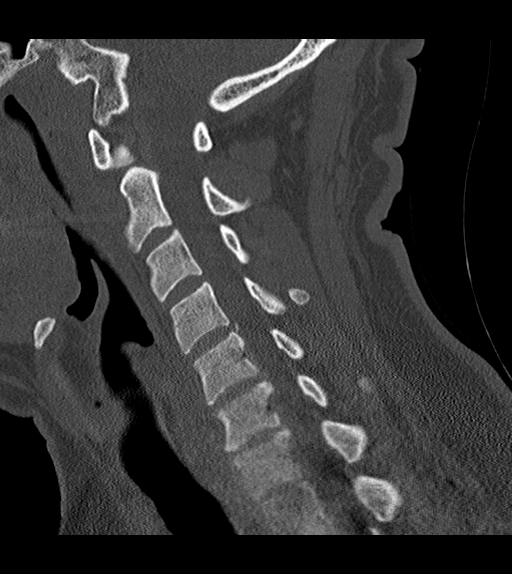
[im 55/83  bone]
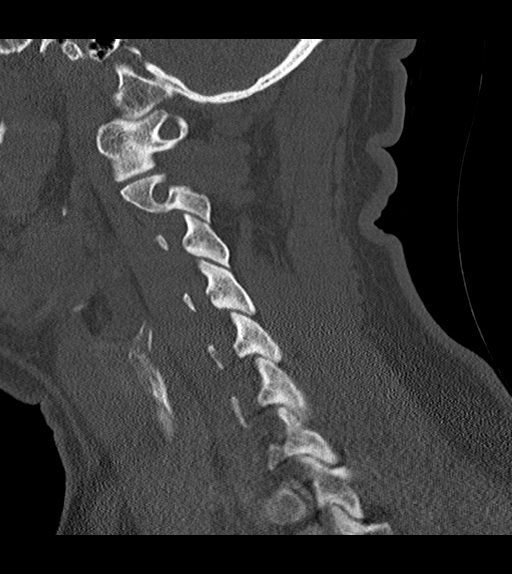

[Series 5: coronal bone · coronal · 0.34mm/px · 3 of 82 slices shown]
[im 23/82  bone]
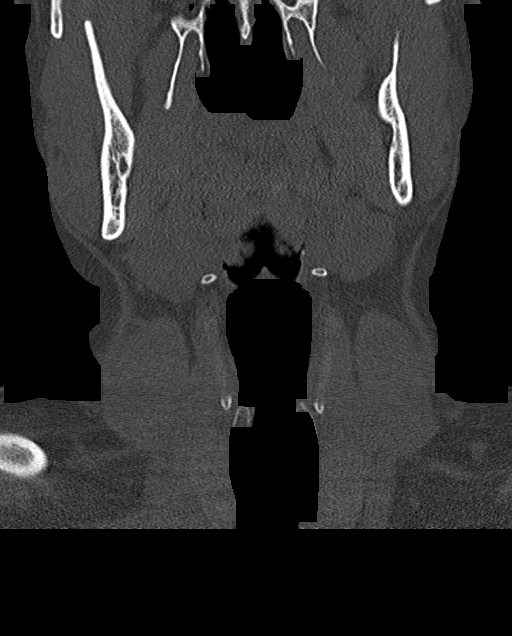
[im 35/82  bone]
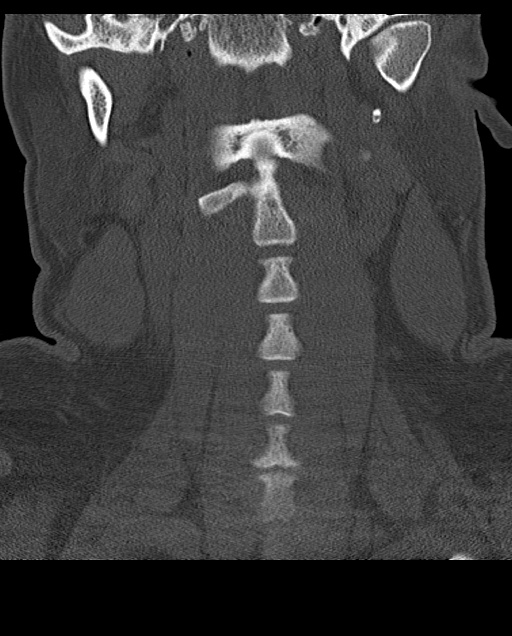
[im 47/82  bone]
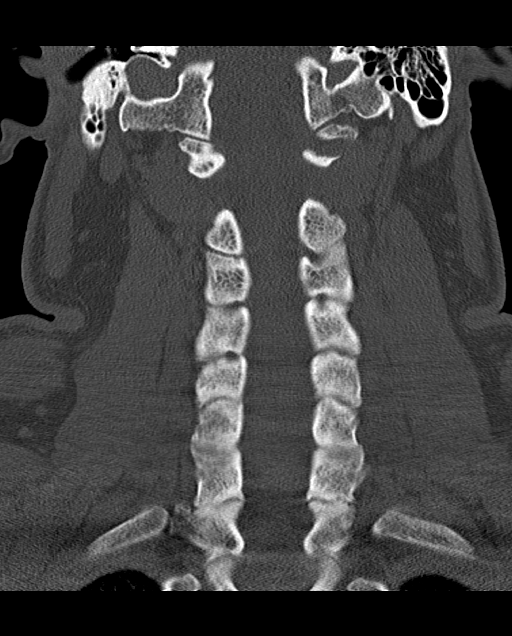

[Series 6: orthogonal bone · axial · 0.30mm/px · z∈[-236,-82]mm · 6 of 122 slices shown, 8 images]
[im 18/122  soft-tissue]
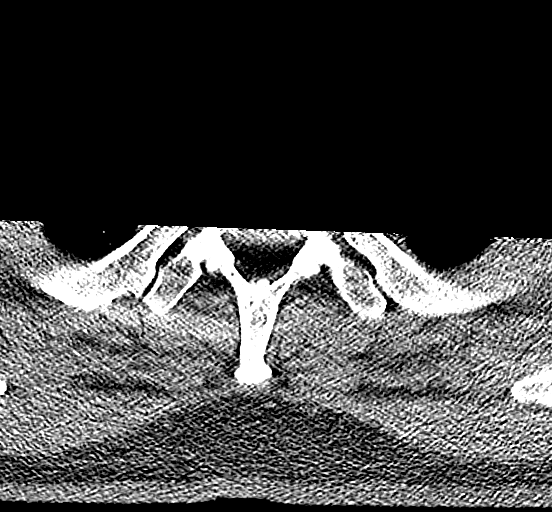
[im 18/122  bone]
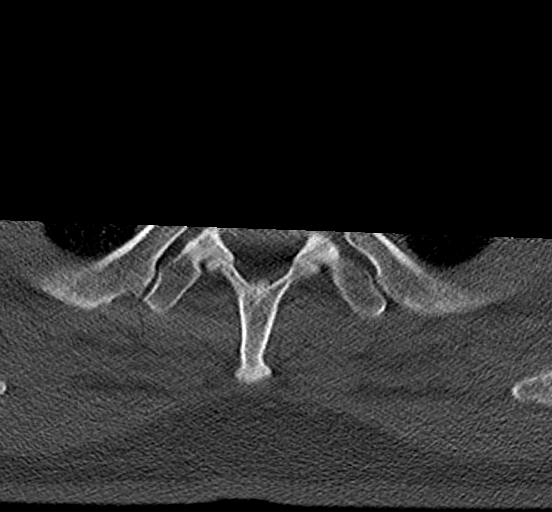
[im 35/122  bone]
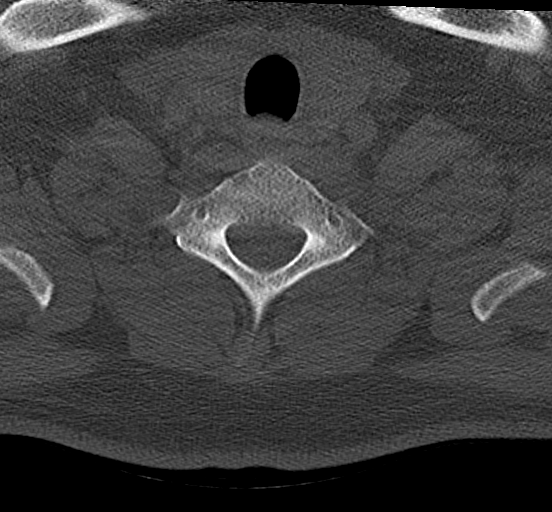
[im 52/122  bone]
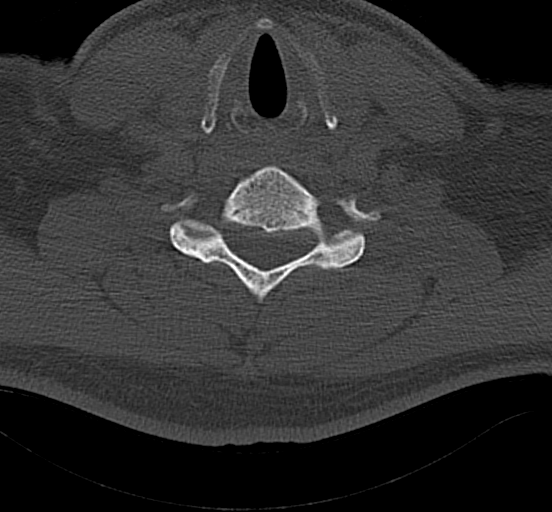
[im 70/122  bone]
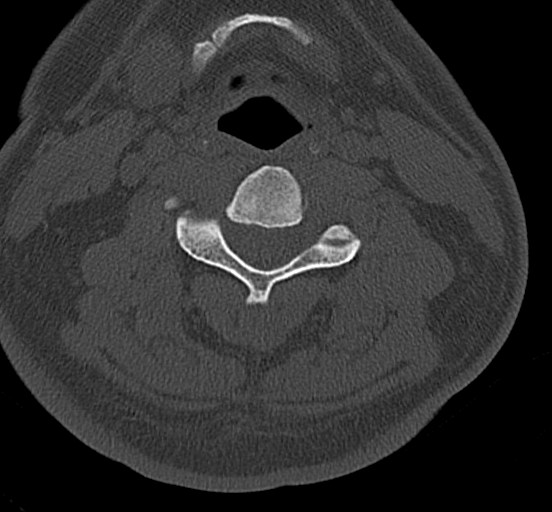
[im 87/122  soft-tissue]
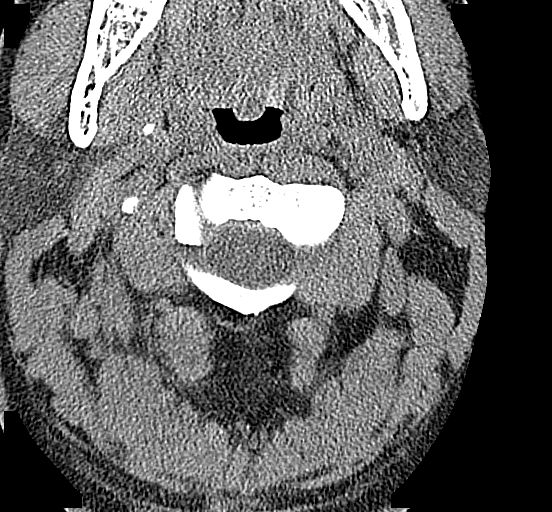
[im 87/122  bone]
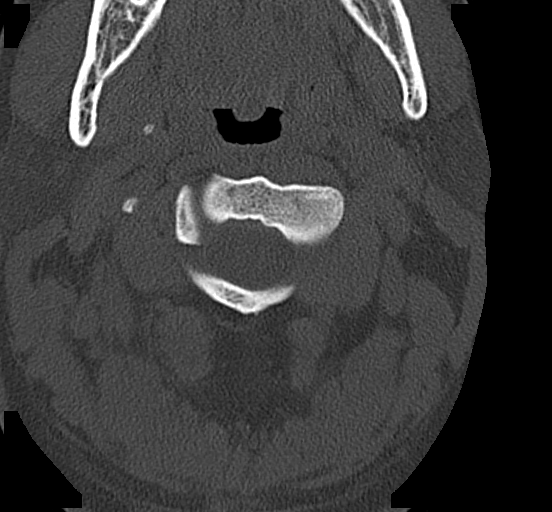
[im 104/122  bone]
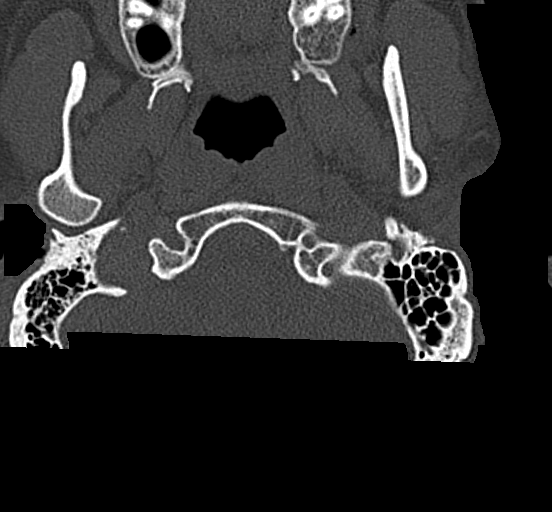

[14 of 33 positions shown; findings below may reference images not displayed]

FINDINGS: Alignment: Normal.

Skull base and vertebrae: No acute fracture. No primary bone lesion
or focal pathologic process.

Soft tissues and spinal canal: No prevertebral fluid or swelling. No
visible canal hematoma.

Disc levels: Mild degenerative changes throughout the cervical spine
greatest at C5-C6 and C6-C7. Mild canal narrowing is suggested at
C5-6 and C6-7. Also with potential neural foraminal narrowing at
C7-T1.

Upper chest: Negative.

Other: None.
IMPRESSION: Mild degenerative changes. No fracture or traumatic malalignment.

Suggestion of mild neural foraminal narrowing at C7-T1 bilaterally.
MRI may be helpful given symptoms to assess for disc related changes
that could explain symptoms.

## 2021-03-25 IMAGING — CR DG SHOULDER 2+V*R*
3 series · 3 of 3 positions shown · non-contrast
Comparison: None.

CLINICAL DATA: Right upper extremity pain and neck pain. Symptoms
for 1 week. No trauma history

EXAM:
RIGHT SHOULDER - 2+ VIEW

[w shoulder axillary right *]
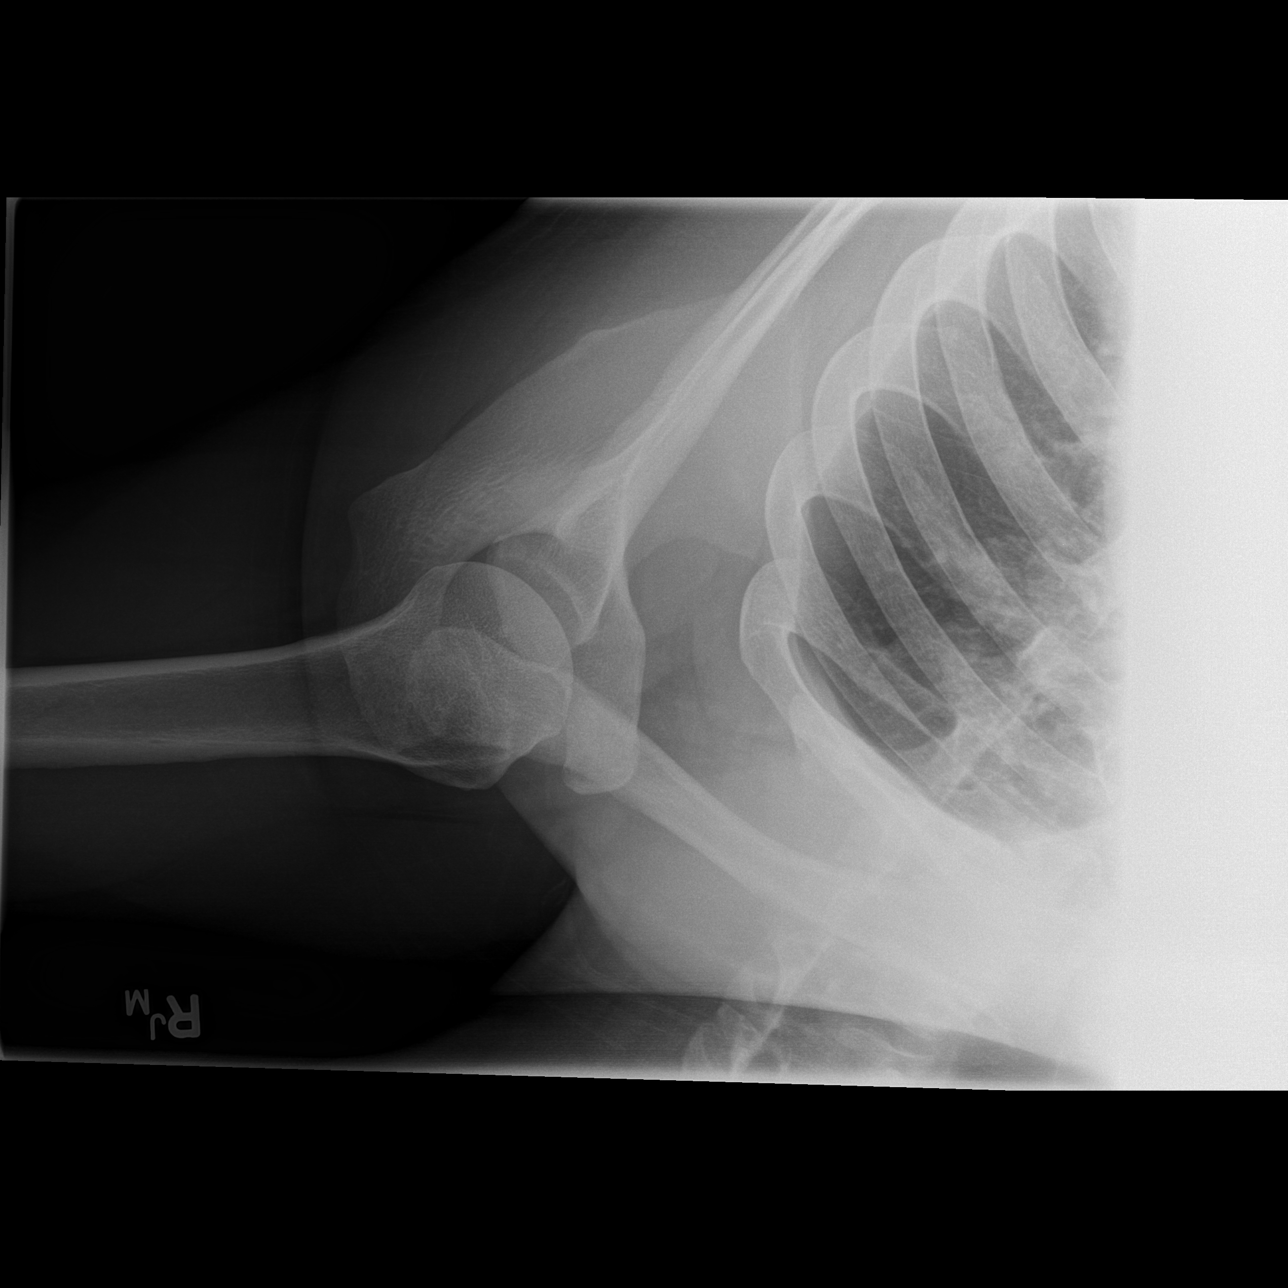

[w shoulder grashey right]
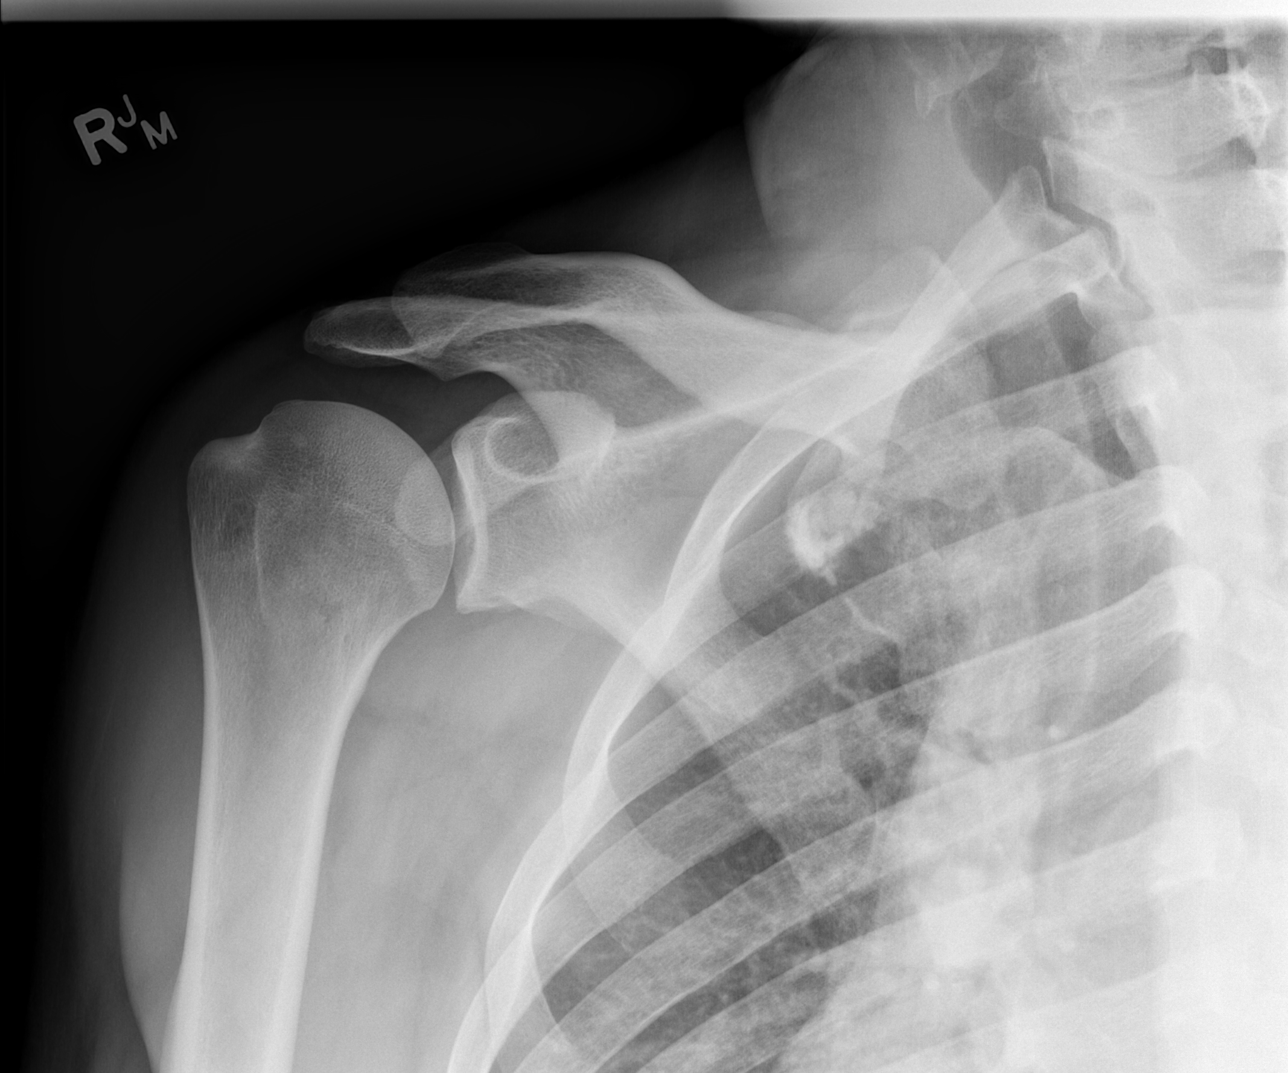

[w shoulder y view right]
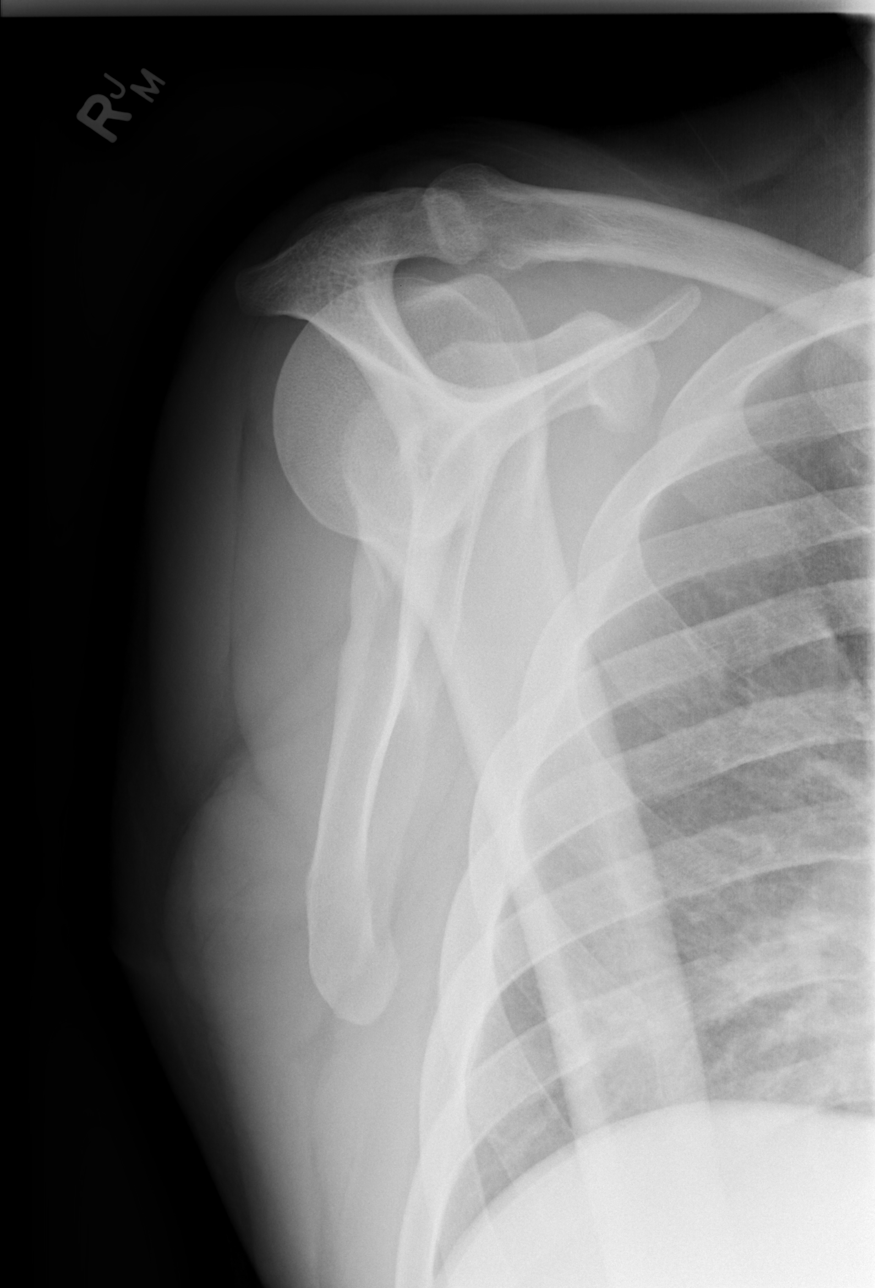

[3 of 3 positions shown; findings below may reference images not displayed]

FINDINGS: There is no evidence of fracture or dislocation. There is no
evidence of arthropathy or other focal bone abnormality. Soft
tissues are unremarkable.
IMPRESSION: Negative.

## 2021-04-01 ENCOUNTER — Ambulatory Visit: Payer: No Typology Code available for payment source | Admitting: Student

## 2021-04-01 ENCOUNTER — Ambulatory Visit: Payer: No Typology Code available for payment source | Admitting: Cardiology

## 2021-04-27 ENCOUNTER — Encounter: Payer: Self-pay | Admitting: Student

## 2021-04-27 ENCOUNTER — Ambulatory Visit: Payer: No Typology Code available for payment source | Admitting: Student

## 2021-04-27 ENCOUNTER — Other Ambulatory Visit: Payer: Self-pay

## 2021-04-27 VITALS — BP 142/85 | HR 67 | Temp 98.1°F | Resp 16 | Ht 74.0 in | Wt 274.8 lb

## 2021-04-27 DIAGNOSIS — I10 Essential (primary) hypertension: Secondary | ICD-10-CM

## 2021-04-27 DIAGNOSIS — R931 Abnormal findings on diagnostic imaging of heart and coronary circulation: Secondary | ICD-10-CM

## 2021-04-27 DIAGNOSIS — E7800 Pure hypercholesterolemia, unspecified: Secondary | ICD-10-CM

## 2021-04-27 DIAGNOSIS — R072 Precordial pain: Secondary | ICD-10-CM

## 2021-04-27 NOTE — Progress Notes (Signed)
EKG 04/27/2021: Normal sinus rhythm with rate of 57 bpm, normal axis.  No evidence of ischemia, normal EKG.

## 2021-04-27 NOTE — Progress Notes (Signed)
? ?Primary Physician/Referring:  Margaree Mackintosh, MD ? ?Patient ID: Paul Freeman, male    DOB: Jun 16, 1975, 46 y.o.   MRN: [id number] ? ?Chief Complaint  ?Patient presents with  ? Coronary Artery Disease  ? Hyperlipidemia  ? Shortness of Breath  ? Follow-up  ? ?HPI:   ? ?Paul Freeman  is a 46 y.o. Caucasian male with hypertension, extreme anxiety, mild  G6PD deficiency, who has responded well to blood pressure control with lisinopril/HCT, HCT utilized in spite of G6PD deficiency at a low dose which he is tolerating for years now. BP without lisinopril/HCT was uncontrolled. ? ?Due to  atypical chest pain and elevated coronary calcium score that was done in 2018, he underwent coronary CT angiogram on 12/07/2018 revealing calcium score of 83 in the 94th percentile, proximal LAD less than 50% stenosis not hemodynamically significant.  He was therefore started on statin and beta-blocker therapy.  Due to episodes of dyspnea on exertion and fatigue patient recently underwent repeat coronary CTA which revealed no significant coronary artery stenosis, total calcium score 150.  ? ?Patient presents for follow-up.  He continues to have occasional dyspnea on exertion as well as chronic fatigue which is unchanged over the last few years.  Patient does have a history of anxiety and notes that he did not take his antianxiety medication today therefore his blood pressure is elevated.  However it is typically well controlled on home monitoring.  Denies chest pain. ? ?Past Medical History:  ?Diagnosis Date  ? Anal fissure   ? Anxiety   ? Depression   ? Hypertension   ? Mild hyperlipidemia   ? Pneumonia   ? ?Past Surgical History:  ?Procedure Laterality Date  ? APPENDECTOMY    ? CYSTECTOMY  2008  ? cyst removal from left armpit  ? ?Social History  ? ?Tobacco Use  ? Smoking status: Former  ?  Packs/day: 0.50  ?  Years: 24.00  ?  Pack years: 12.00  ?  Types: Cigarettes  ?  Quit date: 2013  ?  Years since quitting: 10.2  ? Smokeless  tobacco: Never  ?Substance Use Topics  ? Alcohol use: Yes  ?  Comment: occasional  ?Marital Status: Married  ?  ?ROS  ?Review of Systems  ?Constitutional: Negative for malaise/fatigue and weight gain.  ?Cardiovascular:  Positive for dyspnea on exertion (occassional). Negative for chest pain, claudication, leg swelling, near-syncope, orthopnea, palpitations, paroxysmal nocturnal dyspnea and syncope.  ?Neurological:  Negative for dizziness.  ?Psychiatric/Behavioral:  The patient is nervous/anxious.   ?Objective  ? ? ?  04/27/2021  ?  1:21 PM 03/08/2021  ? 11:46 AM 03/08/2021  ? 11:16 AM  ?Vitals with BMI  ?Height 6\' 2"     ?Weight 274 lbs 13 oz    ?BMI 35.27    ?Systolic 142 111 875  ?Diastolic 85 63 90  ?Pulse 67  70  ?  ?Blood pressure (!) 142/85, pulse 67, temperature 98.1 ?F (36.7 ?C), temperature source Temporal, resp. rate 16, height 6\' 2"  (1.88 m), weight 274 lb 12.8 oz (124.6 kg), SpO2 98 %. Body mass index is 35.28 kg/m?. ?  ?Physical Exam ?Constitutional:   ?   Comments: He is well-built and moderately obese in no acute distress.  ?Neck:  ?   Thyroid: No thyromegaly.  ?   Vascular: No JVD.  ?Cardiovascular:  ?   Rate and Rhythm: Normal rate and regular rhythm.  ?   Pulses: Intact distal pulses.     ?  Carotid pulses are 2+ on the right side and 2+ on the left side. ?     Radial pulses are 2+ on the right side and 2+ on the left side.  ?     Femoral pulses are 2+ on the right side and 2+ on the left side. ?     Dorsalis pedis pulses are 2+ on the right side and 2+ on the left side.  ?     Posterior tibial pulses are 2+ on the right side and 2+ on the left side.  ?   Heart sounds: Normal heart sounds. No murmur heard. ?  No gallop.  ?Pulmonary:  ?   Effort: Pulmonary effort is normal.  ?   Breath sounds: Normal breath sounds.  ?Musculoskeletal:  ?   Right lower leg: No edema.  ?   Left lower leg: No edema.  ?Neurological:  ?   Mental Status: He is alert.  ? ?Radiology: ?No results found. ? ?Laboratory  examination:  ? ?G6PD deficiency 01/25/18: ?G-6-PD, Quant 4.6 - 13.5 U/g Hb <.3Low   ? ?Recent Labs  ?  03/08/21 ?0902 03/08/21 ?1123  ?NA 142  --   ?K 4.1  --   ?CL 100  --   ?CO2 29  --   ?GLUCOSE 105*  --   ?BUN 9  --   ?CREATININE 0.96 1.00  ?CALCIUM 9.8  --   ? ? ?  Latest Ref Rng & Units 03/08/2021  ? 11:23 AM 03/08/2021  ?  9:02 AM 09/20/2019  ? 11:59 AM  ?CMP  ?Glucose 70 - 99 mg/dL  161     ?BUN 6 - 24 mg/dL  9     ?Creatinine 0.61 - 1.24 mg/dL 0.96   0.45     ?Sodium 134 - 144 mmol/L  142     ?Potassium 3.5 - 5.2 mmol/L  4.1     ?Chloride 96 - 106 mmol/L  100     ?CO2 20 - 29 mmol/L  29     ?Calcium 8.7 - 10.2 mg/dL  9.8     ?Total Protein 6.0 - 8.5 g/dL  7.1     ?Total Bilirubin 0.0 - 1.2 mg/dL  0.9   1.0    ?Alkaline Phos 44 - 121 IU/L  79     ?AST 0 - 40 IU/L  20     ?ALT 0 - 44 IU/L  27     ? ? ?  Latest Ref Rng & Units 03/08/2021  ?  9:02 AM 09/06/2019  ?  9:48 AM 09/17/2018  ? 10:42 AM  ?CBC  ?WBC 3.4 - 10.8 x10E3/uL 7.0   6.2   6.2    ?Hemoglobin 13.0 - 17.7 g/dL 40.9   81.1   91.4    ?Hematocrit 37.5 - 51.0 % 45.4   44.3   42.6    ?Platelets 150 - 450 x10E3/uL 213   205   182    ? ?Lipid Panel  ?   ?Component Value Date/Time  ? CHOL 128 03/08/2021 0902  ? TRIG 117 03/08/2021 0902  ? HDL 46 03/08/2021 0902  ? CHOLHDL 2.4 09/06/2019 0948  ? VLDL 24.0 04/18/2011 0904  ? LDLCALC 61 03/08/2021 0902  ? LDLCALC 53 09/06/2019 0948  ? ?HEMOGLOBIN A1C ?Lab Results  ?Component Value Date  ? HGBA1C 4.9 03/08/2021  ? ?TSH ?Recent Labs  ?  03/08/21 ?0902  ?TSH 0.511  ? ?Allergies  ? ?Allergies  ?Allergen Reactions  ?  Penicillins Other (See Comments)  ?  UNKNOWN/childhood   ?  ? ? ?Medications Prior to Visit:  ? ?Outpatient Medications Prior to Visit  ?Medication Sig Dispense Refill  ? amLODipine (NORVASC) 5 MG tablet TAKE 1 TABLET (5 MG TOTAL) BY MOUTH DAILY. NEEDS TO SCHEDULE APPT FOR REFILLS. 90 tablet 1  ? cetirizine (ZYRTEC) 10 MG tablet TAKE 1 TABLET BY MOUTH EVERY DAY 90 tablet 3  ? Coenzyme Q10 (COQ10 PO) Take  by mouth daily.    ? diazepam (VALIUM) 10 MG tablet TAKE 1/2 TO 1 TABLET BY MOUTH 2-3 TIMES DAILY AS NEEDED 90 tablet 1  ? lisinopril-hydrochlorothiazide (ZESTORETIC) 20-12.5 MG tablet TAKE 1 TABLET BY MOUTH EVERY DAY 90 tablet 1  ? metoprolol tartrate (LOPRESSOR) 100 MG tablet TAKE 1 TABLET BY MOUTH TWICE A DAY 180 tablet 1  ? rosuvastatin (CRESTOR) 10 MG tablet TAKE 1 TABLET BY MOUTH EVERY DAY 90 tablet 3  ? venlafaxine XR (EFFEXOR-XR) 150 MG 24 hr capsule TAKE 1 CAPSULE BY MOUTH DAILY WITH BREAKFAST. 90 capsule 2  ? ?No facility-administered medications prior to visit.  ? ?Final Medications at End of Visit   ? ?No outpatient medications have been marked as taking for the 04/27/21 encounter (Office Visit) with Rayford Halsted, PA-C.  ? ?Cardiac Studies:  ? ?Treadmill exercise stress test 02/24/2017: Indication: Screening for CAD, hypertension The patient exercised on Bruce protocol for 9:44 min. Patient achieved 11.36 METS and reached HR 185 bpm, which is 102 % of maximum age-predicted HR. Stress test terminated due to fatigue. Resting EKG demonstrates Normal sinus rhythm. ST Changes: With peak exercise there was no ST-T changes of ischemia.  ? ?Chest Pain: none. BP Response to Exercise: Normal resting BP- appropriate response. Hypertensive at rest 164/102 mm Hg. However in recovery, patient developed severe hypotension with BP 80/60 mm Hg. Took 10 minutes before he recuperated with normal BP and dizziness subsided. No arrhythmias. Rec: Patient's BP response is probably normal but exaggerated response with vasodilatation post exercise. Excellent effort without chest pain or dyspnea.  ?Recommend echocardiogram to evaluate LV function. ? ?Echo- 02/28/2017 ?1. Left ventricle cavity is normal in size. Mild concentric hypertrophy of the left ventricle. Normal global wall ?motion. Normal diastolic filling pattern. Calculated EF 59%. ?2. Left atrial cavity is mildly dilated. ?3. Mild (Grade I) mitral regurgitation. ?4.  Mild tricuspid regurgitation. No evidence of pulmonary hypertension. ?5. IVC is dilated with respiratory variation. ? ?Coronary CT angiogram 12/07/2018: ?1. Coronary calcium score of 83.6. Calcium score is 94

## 2021-04-27 NOTE — Patient Instructions (Signed)
Heart-Healthy Eating Plan Heart-healthy meal planning includes: Eating less unhealthy fats. Eating more healthy fats. Making other changes in your diet. Talk with your doctor or a diet specialist (dietitian) to create an eating plan that is right for you. What is my plan? Your doctor may recommend an eating plan that includes: Total fat: ______% or less of total calories a day. Saturated fat: ______% or less of total calories a day. Cholesterol: less than _________mg a day. What are tips for following this plan? Cooking Avoid frying your food. Try to bake, boil, grill, or broil it instead. You can also reduce fat by: Removing the skin from poultry. Removing all visible fats from meats. Steaming vegetables in water or broth. Meal planning  At meals, divide your plate into four equal parts: Fill one-half of your plate with vegetables and green salads. Fill one-fourth of your plate with whole grains. Fill one-fourth of your plate with lean protein foods. Eat 4-5 servings of vegetables per day. A serving of vegetables is: 1 cup of raw or cooked vegetables. 2 cups of raw leafy greens. Eat 4-5 servings of fruit per day. A serving of fruit is: 1 medium whole fruit.  cup of dried fruit.  cup of fresh, frozen, or canned fruit.  cup of 100% fruit juice. Eat more foods that have soluble fiber. These are apples, broccoli, carrots, beans, peas, and barley. Try to get 20-30 g of fiber per day. Eat 4-5 servings of nuts, legumes, and seeds per week: 1 serving of dried beans or legumes equals  cup after being cooked. 1 serving of nuts is  cup. 1 serving of seeds equals 1 tablespoon. General information Eat more home-cooked food. Eat less restaurant, buffet, and fast food. Limit or avoid alcohol. Limit foods that are high in starch and sugar. Avoid fried foods. Lose weight if you are overweight. Keep track of how much salt (sodium) you eat. This is important if you have high blood  pressure. Ask your doctor to tell you more about this. Try to add vegetarian meals each week. Fats Choose healthy fats. These include olive oil and canola oil, flaxseeds, walnuts, almonds, and seeds. Eat more omega-3 fats. These include salmon, mackerel, sardines, tuna, flaxseed oil, and ground flaxseeds. Try to eat fish at least 2 times each week. Check food labels. Avoid foods with trans fats or high amounts of saturated fat. Limit saturated fats. These are often found in animal products, such as meats, butter, and cream. These are also found in plant foods, such as palm oil, palm kernel oil, and coconut oil. Avoid foods with partially hydrogenated oils in them. These have trans fats. Examples are stick margarine, some tub margarines, cookies, crackers, and other baked goods. What foods can I eat? Fruits All fresh, canned (in natural juice), or frozen fruits. Vegetables Fresh or frozen vegetables (raw, steamed, roasted, or grilled). Green salads. Grains Most grains. Choose whole wheat and whole grains most of the time. Rice and pasta, including brown rice and pastas made with whole wheat. Meats and other proteins Lean, well-trimmed beef, veal, pork, and lamb. Chicken and turkey without skin. All fish and shellfish. Wild duck, rabbit, pheasant, and venison. Egg whites or low-cholesterol egg substitutes. Dried beans, peas, lentils, and tofu. Seeds and most nuts. Dairy Low-fat or nonfat cheeses, including ricotta and mozzarella. Skim or 1% milk that is liquid, powdered, or evaporated. Buttermilk that is made with low-fat milk. Nonfat or low-fat yogurt. Fats and oils Non-hydrogenated (trans-free) margarines. Vegetable oils, including   soybean, sesame, sunflower, olive, peanut, safflower, corn, canola, and cottonseed. Salad dressings or mayonnaise made with a vegetable oil. Beverages Mineral water. Coffee and tea. Diet carbonated beverages. Sweets and desserts Sherbet, gelatin, and fruit ice.  Small amounts of dark chocolate. Limit all sweets and desserts. Seasonings and condiments All seasonings and condiments. The items listed above may not be a complete list of foods and drinks you can eat. Contact a dietitian for more options. What foods should I avoid? Fruits Canned fruit in heavy syrup. Fruit in cream or butter sauce. Fried fruit. Limit coconut. Vegetables Vegetables cooked in cheese, cream, or butter sauce. Fried vegetables. Grains Breads that are made with saturated or trans fats, oils, or whole milk. Croissants. Sweet rolls. Donuts. High-fat crackers, such as cheese crackers. Meats and other proteins Fatty meats, such as hot dogs, ribs, sausage, bacon, rib-eye roast or steak. High-fat deli meats, such as salami and bologna. Caviar. Domestic duck and goose. Organ meats, such as liver. Dairy Cream, sour cream, cream cheese, and creamed cottage cheese. Whole-milk cheeses. Whole or 2% milk that is liquid, evaporated, or condensed. Whole buttermilk. Cream sauce or high-fat cheese sauce. Yogurt that is made from whole milk. Fats and oils Meat fat, or shortening. Cocoa butter, hydrogenated oils, palm oil, coconut oil, palm kernel oil. Solid fats and shortenings, including bacon fat, salt pork, lard, and butter. Nondairy cream substitutes. Salad dressings with cheese or sour cream. Beverages Regular sodas and juice drinks with added sugar. Sweets and desserts Frosting. Pudding. Cookies. Cakes. Pies. Milk chocolate or white chocolate. Buttered syrups. Full-fat ice cream or ice cream drinks. The items listed above may not be a complete list of foods and drinks to avoid. Contact a dietitian for more information. Summary Heart-healthy meal planning includes eating less unhealthy fats, eating more healthy fats, and making other changes in your diet. Eat a balanced diet. This includes fruits and vegetables, low-fat or nonfat dairy, lean protein, nuts and legumes, whole grains, and  heart-healthy oils and fats. This information is not intended to replace advice given to you by your health care provider. Make sure you discuss any questions you have with your health care provider. Document Revised: 06/04/2020 Document Reviewed: 06/04/2020 Elsevier Patient Education  2022 [employer] Inc.  

## 2021-04-28 ENCOUNTER — Other Ambulatory Visit: Payer: Self-pay | Admitting: Internal Medicine

## 2021-04-29 ENCOUNTER — Telehealth: Payer: Self-pay | Admitting: Internal Medicine

## 2021-04-29 NOTE — Telephone Encounter (Signed)
Unable to leave voice message. Patient needs to schedule a follow up his Valium was filled last night and he has not been here for well visit in over a year. ?

## 2021-04-30 ENCOUNTER — Ambulatory Visit: Payer: No Typology Code available for payment source | Admitting: Cardiology

## 2021-05-13 ENCOUNTER — Ambulatory Visit: Payer: No Typology Code available for payment source

## 2021-05-13 DIAGNOSIS — R072 Precordial pain: Secondary | ICD-10-CM

## 2021-05-13 DIAGNOSIS — R0609 Other forms of dyspnea: Secondary | ICD-10-CM

## 2021-05-17 ENCOUNTER — Other Ambulatory Visit: Payer: Self-pay | Admitting: Internal Medicine

## 2021-05-17 NOTE — Progress Notes (Signed)
Echo showed normal pumping activity and normal valve functioning

## 2021-05-17 NOTE — Progress Notes (Signed)
Called pt to inform him about his echo results. Pt understood

## 2021-05-18 ENCOUNTER — Other Ambulatory Visit: Payer: Self-pay | Admitting: Cardiology

## 2021-05-18 DIAGNOSIS — F411 Generalized anxiety disorder: Secondary | ICD-10-CM

## 2021-05-18 MED ORDER — DIAZEPAM 10 MG PO TABS: ORAL_TABLET | ORAL | 5 refills | Status: DC

## 2021-06-08 ENCOUNTER — Ambulatory Visit: Payer: No Typology Code available for payment source | Admitting: Internal Medicine

## 2021-06-08 ENCOUNTER — Telehealth: Payer: Self-pay | Admitting: Internal Medicine

## 2021-06-08 NOTE — Telephone Encounter (Signed)
OK no charge ?

## 2021-06-08 NOTE — Telephone Encounter (Signed)
Good Morning Dr. Carlean Purl, ? ? 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 ? ?Patient was rescheduled for 6/1 at 10:30.  ?

## 2021-06-14 ENCOUNTER — Other Ambulatory Visit: Payer: Self-pay | Admitting: Internal Medicine

## 2021-06-15 ENCOUNTER — Other Ambulatory Visit: Payer: Self-pay | Admitting: Internal Medicine

## 2021-06-24 ENCOUNTER — Other Ambulatory Visit: Payer: Self-pay | Admitting: Internal Medicine

## 2021-06-24 DIAGNOSIS — F411 Generalized anxiety disorder: Secondary | ICD-10-CM

## 2021-06-27 ENCOUNTER — Other Ambulatory Visit: Payer: Self-pay | Admitting: Internal Medicine

## 2021-06-27 ENCOUNTER — Encounter: Payer: Self-pay | Admitting: Internal Medicine

## 2021-06-27 DIAGNOSIS — I10 Essential (primary) hypertension: Secondary | ICD-10-CM

## 2021-06-27 NOTE — Telephone Encounter (Signed)
Patient is requesting multiple refills. He needs an appt with me to follow up as we have not seen him in some time. A 30 minutes appt will be OK for now

## 2021-06-28 ENCOUNTER — Other Ambulatory Visit: Payer: Self-pay | Admitting: Cardiology

## 2021-06-28 ENCOUNTER — Telehealth: Payer: Self-pay | Admitting: Cardiology

## 2021-06-28 DIAGNOSIS — I10 Essential (primary) hypertension: Secondary | ICD-10-CM

## 2021-06-28 DIAGNOSIS — R931 Abnormal findings on diagnostic imaging of heart and coronary circulation: Secondary | ICD-10-CM

## 2021-06-28 DIAGNOSIS — E7800 Pure hypercholesterolemia, unspecified: Secondary | ICD-10-CM

## 2021-06-28 DIAGNOSIS — F411 Generalized anxiety disorder: Secondary | ICD-10-CM

## 2021-06-28 MED ORDER — METOPROLOL TARTRATE 100 MG PO TABS: 100.0000 mg | ORAL_TABLET | Freq: Two times a day (BID) | ORAL | 3 refills | Status: DC

## 2021-06-28 MED ORDER — LISINOPRIL-HYDROCHLOROTHIAZIDE 20-12.5 MG PO TABS: 1.0000 | ORAL_TABLET | Freq: Every day | ORAL | 3 refills | Status: DC

## 2021-06-28 MED ORDER — ROSUVASTATIN CALCIUM 10 MG PO TABS: 10.0000 mg | ORAL_TABLET | Freq: Every day | ORAL | 3 refills | Status: DC

## 2021-06-28 MED ORDER — AMLODIPINE BESYLATE 5 MG PO TABS: ORAL_TABLET | ORAL | 3 refills | Status: DC

## 2021-06-28 MED ORDER — DIAZEPAM 10 MG PO TABS: ORAL_TABLET | ORAL | 5 refills | Status: DC

## 2021-06-28 NOTE — Telephone Encounter (Signed)
ICD-10-CM   1. Primary hypertension  I10 lisinopril-hydrochlorothiazide (ZESTORETIC) 20-12.5 MG tablet    metoprolol tartrate (LOPRESSOR) 100 MG tablet    amLODipine (NORVASC) 5 MG tablet    2. Elevated coronary artery calcium score  R93.1 rosuvastatin (CRESTOR) 10 MG tablet    3. Generalized anxiety disorder  F41.1 diazepam (VALIUM) 10 MG tablet    4. Hypercholesteremia  E78.00 rosuvastatin (CRESTOR) 10 MG tablet     Meds ordered this encounter  Medications   diazepam (VALIUM) 10 MG tablet    Sig: For anxiety    Dispense:  30 tablet    Refill:  5   lisinopril-hydrochlorothiazide (ZESTORETIC) 20-12.5 MG tablet    Sig: Take 1 tablet by mouth daily.    Dispense:  90 tablet    Refill:  3   metoprolol tartrate (LOPRESSOR) 100 MG tablet    Sig: Take 1 tablet (100 mg total) by mouth 2 (two) times daily.    Dispense:  180 tablet    Refill:  3    He needs an appointment   rosuvastatin (CRESTOR) 10 MG tablet    Sig: Take 1 tablet (10 mg total) by mouth daily.    Dispense:  90 tablet    Refill:  3   amLODipine (NORVASC) 5 MG tablet    Sig: TAKE 1 TABLET (5 MG TOTAL) BY MOUTH DAILY.    Dispense:  90 tablet    Refill:  3    Medications Discontinued During This Encounter  Medication Reason   rosuvastatin (CRESTOR) 10 MG tablet    lisinopril-hydrochlorothiazide (ZESTORETIC) 20-12.5 MG tablet    diazepam (VALIUM) 10 MG tablet    metoprolol tartrate (LOPRESSOR) 100 MG tablet    amLODipine (NORVASC) 5 MG tablet

## 2021-06-29 NOTE — Telephone Encounter (Signed)
Scheduled appointment 07/09/2021

## 2021-07-05 ENCOUNTER — Encounter: Payer: Self-pay | Admitting: Internal Medicine

## 2021-07-07 ENCOUNTER — Telehealth: Payer: Self-pay

## 2021-07-07 ENCOUNTER — Other Ambulatory Visit: Payer: Self-pay

## 2021-07-07 DIAGNOSIS — E7800 Pure hypercholesterolemia, unspecified: Secondary | ICD-10-CM

## 2021-07-07 DIAGNOSIS — F411 Generalized anxiety disorder: Secondary | ICD-10-CM

## 2021-07-07 DIAGNOSIS — I10 Essential (primary) hypertension: Secondary | ICD-10-CM

## 2021-07-07 DIAGNOSIS — R931 Abnormal findings on diagnostic imaging of heart and coronary circulation: Secondary | ICD-10-CM

## 2021-07-07 MED ORDER — LISINOPRIL-HYDROCHLOROTHIAZIDE 20-12.5 MG PO TABS: 1.0000 | ORAL_TABLET | Freq: Every day | ORAL | 3 refills | Status: DC

## 2021-07-07 MED ORDER — DIAZEPAM 10 MG PO TABS: ORAL_TABLET | ORAL | 5 refills | Status: DC

## 2021-07-07 MED ORDER — METOPROLOL TARTRATE 100 MG PO TABS: 100.0000 mg | ORAL_TABLET | Freq: Two times a day (BID) | ORAL | 3 refills | Status: DC

## 2021-07-07 MED ORDER — ROSUVASTATIN CALCIUM 10 MG PO TABS: 10.0000 mg | ORAL_TABLET | Freq: Every day | ORAL | 3 refills | Status: DC

## 2021-07-07 MED ORDER — AMLODIPINE BESYLATE 5 MG PO TABS: ORAL_TABLET | ORAL | 3 refills | Status: DC

## 2021-07-07 MED ORDER — LISINOPRIL-HYDROCHLOROTHIAZIDE 20-12.5 MG PO TABS: 1.0000 | ORAL_TABLET | ORAL | 3 refills | Status: DC

## 2021-07-07 NOTE — Telephone Encounter (Signed)
Initial Rx sent to [facility], not filled. New Rx sent to [facility] per patient request.   Patient strongly encouraged to f/u with PCP for refills.    ICD-10-CM   1. Generalized anxiety disorder  F41.1 diazepam (VALIUM) 10 MG tablet    2. Primary hypertension  I10 amLODipine (NORVASC) 5 MG tablet    lisinopril-hydrochlorothiazide (ZESTORETIC) 20-12.5 MG tablet    metoprolol tartrate (LOPRESSOR) 100 MG tablet    3. Elevated coronary artery calcium score  R93.1 rosuvastatin (CRESTOR) 10 MG tablet    4. Hypercholesteremia  E78.00 rosuvastatin (CRESTOR) 10 MG tablet     Meds ordered this encounter  Medications   diazepam (VALIUM) 10 MG tablet    Sig: For anxiety    Dispense:  30 tablet    Refill:  5   amLODipine (NORVASC) 5 MG tablet    Sig: TAKE 1 TABLET (5 MG TOTAL) BY MOUTH DAILY.    Dispense:  90 tablet    Refill:  3   lisinopril-hydrochlorothiazide (ZESTORETIC) 20-12.5 MG tablet    Sig: Take 1 tablet by mouth every morning.    Dispense:  90 tablet    Refill:  3   metoprolol tartrate (LOPRESSOR) 100 MG tablet    Sig: Take 1 tablet (100 mg total) by mouth 2 (two) times daily.    Dispense:  180 tablet    Refill:  3    He needs an appointment   rosuvastatin (CRESTOR) 10 MG tablet    Sig: Take 1 tablet (10 mg total) by mouth daily.    Dispense:  90 tablet    Refill:  3    Medications Discontinued During This Encounter  Medication Reason   diazepam (VALIUM) 10 MG tablet    amLODipine (NORVASC) 5 MG tablet    lisinopril-hydrochlorothiazide (ZESTORETIC) 20-12.5 MG tablet    metoprolol tartrate (LOPRESSOR) 100 MG tablet    rosuvastatin (CRESTOR) 10 MG tablet

## 2021-07-08 ENCOUNTER — Ambulatory Visit (INDEPENDENT_AMBULATORY_CARE_PROVIDER_SITE_OTHER): Payer: No Typology Code available for payment source | Admitting: Internal Medicine

## 2021-07-08 ENCOUNTER — Encounter: Payer: Self-pay | Admitting: Internal Medicine

## 2021-07-08 VITALS — BP 122/90 | HR 72 | Ht 74.0 in | Wt 282.0 lb

## 2021-07-08 DIAGNOSIS — K601 Chronic anal fissure: Secondary | ICD-10-CM | POA: Diagnosis not present

## 2021-07-08 DIAGNOSIS — Z1211 Encounter for screening for malignant neoplasm of colon: Secondary | ICD-10-CM

## 2021-07-08 DIAGNOSIS — K5909 Other constipation: Secondary | ICD-10-CM

## 2021-07-08 NOTE — Patient Instructions (Signed)
You have been scheduled for a colonoscopy. Please follow written instructions given to you at your visit today.  Please pick up your prep supplies at the pharmacy within the next 1-3 days. If you use inhalers (even only as needed), please bring them with you on the day of your procedure.  If you are age 45 or older, your body mass index should be between 23-30. Your Body mass index is 36.21 kg/m. If this is out of the aforementioned range listed, please consider follow up with your Primary Care Provider.  If you are age 18 or younger, your body mass index should be between 19-25. Your Body mass index is 36.21 kg/m. If this is out of the aformentioned range listed, please consider follow up with your Primary Care Provider.   ________________________________________________________  The  GI providers would like to encourage you to use [facility] to communicate with providers for non-urgent requests or questions.  Due to long hold times on the telephone, sending your provider a message by [facility] may be a faster and more efficient way to get a response.  Please allow 48 business hours for a response.  Please remember that this is for non-urgent requests.  _______________________________________________________   I appreciate the opportunity to care for you. Silvano Rusk, MD, [facility]

## 2021-07-08 NOTE — Progress Notes (Signed)
Paul Freeman 46 y.o. Apr 03, 1975 [id number]  Assessment & Plan:   Encounter Diagnoses  Name Primary?   Colon cancer screening Yes   Chronic anterior anal fissure - resolved by history    Chronic constipation - improved on prunes     Schedule screening colonoscopy you will be 46 on July 4. The risks and benefits as well as alternatives of endoscopic procedure(s) have been discussed and reviewed. All questions answered. The patient agrees to proceed.   Continue prunes to help constipation and I believe this is helping his anterior fissure as well.       Subjective:   Chief Complaint: Colon cancer screening  HPI Paul Freeman is a 46 year old white man with a history of anterior anal fissure and hemorrhoid issues who presents for follow-up requesting to schedule a screening colonoscopy.  He has had constipation problems as well and was using Benefiber at my recommendation before but has found that eating several prunes (6) a night promotes bowel habit regularity.  He says his wife complains about excessive gas but he does not think that is really an issue and he does not feel bad related to that.  Fissure symptoms are gone.  Rectal bleeding has resolved as well.  At one point he believes he had some protruding hemorrhoids but again no anorectal symptoms at this time.  Tells me he was diagnosed with G6PD deficiency by Dr. Vickey Huger of neurology within the past couple of years.  He avoids the foods that may trigger that.  Also recently started on testosterone which she believes has led to weight gain.  Wt Readings from Last 3 Encounters:  07/08/21 282 lb (127.9 kg)  04/27/21 274 lb 12.8 oz (124.6 kg)  09/11/20 269 lb (122 kg)     Allergies  Allergen Reactions   Penicillins Other (See Comments)    UNKNOWN/childhood    Current Meds  Medication Sig   AMBULATORY NON FORMULARY MEDICATION Glutathime injection once week   AMBULATORY NON FORMULARY MEDICATION Immunity injection once  a week   AMBULATORY NON FORMULARY MEDICATION Biotin injection once a week   AMBULATORY NON FORMULARY MEDICATION Vitamin D injection once a week   amLODipine (NORVASC) 5 MG tablet TAKE 1 TABLET (5 MG TOTAL) BY MOUTH DAILY.   cetirizine (ZYRTEC) 10 MG tablet TAKE 1 TABLET BY MOUTH EVERY DAY   Coenzyme Q10 (COQ10 PO) Take by mouth daily.   cyanocobalamin (,VITAMIN B-12,) 1000 MCG/ML injection Inject 1,000 mcg into the muscle once a week.   diazepam (VALIUM) 10 MG tablet For anxiety   Lactobacillus Rhamnosus, GG, (CULTURELLE IMMUNITY SUPPORT PO) Take 1 tablet by mouth once a week.   lisinopril-hydrochlorothiazide (ZESTORETIC) 20-12.5 MG tablet Take 1 tablet by mouth every morning.   metoprolol tartrate (LOPRESSOR) 100 MG tablet Take 1 tablet (100 mg total) by mouth 2 (two) times daily.   risperiDONE microspheres (RISPERDAL CONSTA) 25 MG injection Inject 25 mg into the muscle once a week.   rosuvastatin (CRESTOR) 10 MG tablet Take 1 tablet (10 mg total) by mouth daily.   testosterone enanthate (DELATESTRYL) 200 MG/ML injection Inject into the muscle once a week. For IM use only   venlafaxine XR (EFFEXOR-XR) 150 MG 24 hr capsule TAKE 1 CAPSULE BY MOUTH DAILY WITH BREAKFAST.   [DISCONTINUED] Biotin 1 MG CAPS Take 1 tablet by mouth once a week.   [DISCONTINUED] VITAMIN D PO Take 1 tablet by mouth once a week.   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   Review of Systems As per HPI  Objective:   Physical Exam @BP  122/90   Pulse 72   Ht 6\' 2"  (1.88 m)   Wt 282 lb (127.9 kg)   BMI 36.21 kg/m @  General:  NAD Eyes:   anicteric Lungs:  clear Heart::  S1S2 no rubs, murmurs or gallops Abdomen:  soft and nontender, BS+

## 2021-07-09 ENCOUNTER — Other Ambulatory Visit: Payer: No Typology Code available for payment source

## 2021-07-09 ENCOUNTER — Ambulatory Visit: Payer: No Typology Code available for payment source | Admitting: Internal Medicine

## 2021-07-09 ENCOUNTER — Encounter: Payer: Self-pay | Admitting: Internal Medicine

## 2021-07-09 ENCOUNTER — Telehealth: Payer: Self-pay | Admitting: Cardiology

## 2021-07-09 VITALS — BP 120/86 | HR 76 | Temp 97.5°F | Ht 74.0 in | Wt 282.5 lb

## 2021-07-09 DIAGNOSIS — I10 Essential (primary) hypertension: Secondary | ICD-10-CM

## 2021-07-09 DIAGNOSIS — D75A Glucose-6-phosphate dehydrogenase (G6PD) deficiency without anemia: Secondary | ICD-10-CM

## 2021-07-09 DIAGNOSIS — R202 Paresthesia of skin: Secondary | ICD-10-CM | POA: Diagnosis not present

## 2021-07-09 DIAGNOSIS — E782 Mixed hyperlipidemia: Secondary | ICD-10-CM

## 2021-07-09 DIAGNOSIS — Z125 Encounter for screening for malignant neoplasm of prostate: Secondary | ICD-10-CM

## 2021-07-09 DIAGNOSIS — Z8639 Personal history of other endocrine, nutritional and metabolic disease: Secondary | ICD-10-CM

## 2021-07-09 NOTE — Progress Notes (Signed)
Subjective:    Patient ID: Paul Freeman, male    DOB: Dec 30, 1975, 46 y.o.   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  His brother has a history of Multiple sclerosis.  He had CT of the C-spine in November 2020 showing mild neural foraminal narrowing at C7-T1 bilaterally.  Patient has history of anxiety and depression.  He has essential hypertension.  He takes Crestor for hyperlipidemia.  He has seen Dr. Brett Fairy in the past.  He will be referred back for evaluation of these paresthesias.  Today labs were drawn including lipid panel, c-Met, CBC, TSH, free T4, sed rate B12 and folate.  He says business is going fine as his marriage. Children do well in school.  He sees Dr. Einar Gip, Cardiologist ,and is on Crestor 10 mg daily, metoprolol 100 mg twice daily and lisinopril HCTZ 20/12.5 daily in addition to amlodipine 5 mg daily.  He does take Valium for anxiety.  He also takes Effexor XL 150 mg daily.  He saw Dr. Ellene Route in September 2022 to go over MRI that he had done at the end of August.  There were some changes in his disc at C3-C4 with broad-based disc bulge eccentric to the left effacing the ventral aspect of the cord.  He has a history of disc arthroplasty and other levels C4-C5 C5-C6 all appear okay but C6-C7 could not be assessed due to metallic artifact created by disc arthroplasty.  He thought the C3-C4 process to explain symptoms of numbness in the region of his jaw and entire left side including left lower extremity.  He felt that the process would resolve on its own but he still has these issues.  Remote history of smoking but quit in 2013 after smoking 1 or 2 packs a day for some 24 years.  An exercise test by Dr. Einar Gip that was normal in 2019.  History of low TSH consistent with hyperthyroidism and has seen Dr. Chalmers Cater.  Had thyroid scan at [facility] long showing uniform  uptake.  Was tried on methimazole but that caused nausea.  He simply is monitored.  Had appendectomy 1996.  History of urticaria and hives.  History of hypertension and anxiety disorder.  Presented to the emergency department at [facility] in 2019 with atypical chest pain.  EKG was unremarkable other than intraventricular conduction delay.  He subsequently saw Dr. Nicolette Bang who according to our records perform stress testing in the spring 2019 which was normal.  Takes Zyrtec for allergic rhinitis.  Has been evaluated by Dr. Johnnye Sima for possible attention deficit disorder but was told he did not have attention deficit issues.  History of G6PD deficiency with level being less than 0.3.  History of tinea versicolor.  Review of Systems think he is worried about the paresthesias and that his brother has multiple sclerosis.     Objective:   Physical Exam His blood pressure stable at 120/86 pulse 76 regular temperature 97.5 degrees by ear thermometer pulse oximetry 97% weight 282 pounds 8 ounces BMI of 36.27 Sensation to light touch is intact.  No facial weakness.  Chest is clear.  Cardiac exam: Regular rate and rhythm without ectopy or murmurs.  No lower extremity pitting edema.  Affect thought and judgment appear to be normal.  There are no gross deficits on brief neurological exam.      Assessment & Plan:  Subjective:    Patient ID: Paul Freeman, male    DOB: Dec 30, 1975, 46 y.o.   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  His brother has a history of Multiple sclerosis.  He had CT of the C-spine in November 2020 showing mild neural foraminal narrowing at C7-T1 bilaterally.  Patient has history of anxiety and depression.  He has essential hypertension.  He takes Crestor for hyperlipidemia.  He has seen Dr. Brett Fairy in the past.  He will be referred back for evaluation of these paresthesias.  Today labs were drawn including lipid panel, c-Met, CBC, TSH, free T4, sed rate B12 and folate.  He says business is going fine as his marriage. Children do well in school.  He sees Dr. Einar Gip, Cardiologist ,and is on Crestor 10 mg daily, metoprolol 100 mg twice daily and lisinopril HCTZ 20/12.5 daily in addition to amlodipine 5 mg daily.  He does take Valium for anxiety.  He also takes Effexor XL 150 mg daily.  He saw Dr. Ellene Route in September 2022 to go over MRI that he had done at the end of August.  There were some changes in his disc at C3-C4 with broad-based disc bulge eccentric to the left effacing the ventral aspect of the cord.  He has a history of disc arthroplasty and other levels C4-C5 C5-C6 all appear okay but C6-C7 could not be assessed due to metallic artifact created by disc arthroplasty.  He thought the C3-C4 process to explain symptoms of numbness in the region of his jaw and entire left side including left lower extremity.  He felt that the process would resolve on its own but he still has these issues.  Remote history of smoking but quit in 2013 after smoking 1 or 2 packs a day for some 24 years.  An exercise test by Dr. Einar Gip that was normal in 2019.  History of low TSH consistent with hyperthyroidism and has seen Dr. Chalmers Cater.  Had thyroid scan at [facility] long showing uniform  uptake.  Was tried on methimazole but that caused nausea.  He simply is monitored.  Had appendectomy 1996.  History of urticaria and hives.  History of hypertension and anxiety disorder.  Presented to the emergency department at [facility] in 2019 with atypical chest pain.  EKG was unremarkable other than intraventricular conduction delay.  He subsequently saw Dr. Nicolette Bang who according to our records perform stress testing in the spring 2019 which was normal.  Takes Zyrtec for allergic rhinitis.  Has been evaluated by Dr. Johnnye Sima for possible attention deficit disorder but was told he did not have attention deficit issues.  History of G6PD deficiency with level being less than 0.3.  History of tinea versicolor.  Review of Systems think he is worried about the paresthesias and that his brother has multiple sclerosis.     Objective:   Physical Exam His blood pressure stable at 120/86 pulse 76 regular temperature 97.5 degrees by ear thermometer pulse oximetry 97% weight 282 pounds 8 ounces BMI of 36.27 Sensation to light touch is intact.  No facial weakness.  Chest is clear.  Cardiac exam: Regular rate and rhythm without ectopy or murmurs.  No lower extremity pitting edema.  Affect thought and judgment appear to be normal.  There are no gross deficits on brief neurological exam.      Assessment & Plan:

## 2021-07-09 NOTE — Progress Notes (Signed)
Subjective:    Patient ID: Paul Freeman, male    DOB: [birth year], 46 y.o.   MRN: [id number]  HPI Lab only    Review of Systems     Objective:   Physical Exam        Assessment & Plan:

## 2021-07-09 NOTE — Patient Instructions (Signed)
Referral to Dr. Brett Fairy regarding paresthesias that are left-sided.  He has history of degenerative changes in C-spine.  He will continue with current medications.  Labs drawn and are pending.

## 2021-07-10 LAB — CBC WITH DIFFERENTIAL/PLATELET
Absolute Monocytes: 490 cells/uL (ref 200–950)
Basophils Absolute: 32 cells/uL (ref 0–200)
Basophils Relative: 0.4 %
Eosinophils Absolute: 126 cells/uL (ref 15–500)
Eosinophils Relative: 1.6 %
HCT: 49.2 % (ref 38.5–50.0)
Hemoglobin: 16.7 g/dL (ref 13.2–17.1)
Lymphs Abs: 1928 cells/uL (ref 850–3900)
MCH: 30.7 pg (ref 27.0–33.0)
MCHC: 33.9 g/dL (ref 32.0–36.0)
MCV: 90.4 fL (ref 80.0–100.0)
MPV: 10.3 fL (ref 7.5–12.5)
Monocytes Relative: 6.2 %
Neutro Abs: 5325 cells/uL (ref 1500–7800)
Neutrophils Relative %: 67.4 %
Platelets: 209 10*3/uL (ref 140–400)
RBC: 5.44 10*6/uL (ref 4.20–5.80)
RDW: 12.1 % (ref 11.0–15.0)
Total Lymphocyte: 24.4 %
WBC: 7.9 10*3/uL (ref 3.8–10.8)

## 2021-07-10 LAB — T4, FREE: Free T4: 1.3 ng/dL (ref 0.8–1.8)

## 2021-07-10 LAB — COMPLETE METABOLIC PANEL WITH GFR
AG Ratio: 2 (calc) (ref 1.0–2.5)
ALT: 52 U/L — ABNORMAL HIGH (ref 9–46)
AST: 32 U/L (ref 10–40)
Albumin: 4.8 g/dL (ref 3.6–5.1)
Alkaline phosphatase (APISO): 64 U/L (ref 36–130)
BUN: 12 mg/dL (ref 7–25)
CO2: 28 mmol/L (ref 20–32)
Calcium: 9.7 mg/dL (ref 8.6–10.3)
Chloride: 99 mmol/L (ref 98–110)
Creat: 0.87 mg/dL (ref 0.60–1.29)
Globulin: 2.4 g/dL (calc) (ref 1.9–3.7)
Glucose, Bld: 104 mg/dL — ABNORMAL HIGH (ref 65–99)
Potassium: 3.9 mmol/L (ref 3.5–5.3)
Sodium: 138 mmol/L (ref 135–146)
Total Bilirubin: 1.2 mg/dL (ref 0.2–1.2)
Total Protein: 7.2 g/dL (ref 6.1–8.1)
eGFR: 109 mL/min/{1.73_m2} (ref >=60)

## 2021-07-10 LAB — LIPID PANEL
Cholesterol: 132 mg/dL (ref <200)
HDL: 40 mg/dL (ref >=40)
LDL Cholesterol (Calc): 67 mg/dL (calc)
Non-HDL Cholesterol (Calc): 92 mg/dL (calc) (ref <130)
Total CHOL/HDL Ratio: 3.3 (calc) (ref <5.0)
Triglycerides: 170 mg/dL — ABNORMAL HIGH (ref <150)

## 2021-07-10 LAB — SEDIMENTATION RATE: Sed Rate: 2 mm/h (ref 0–15)

## 2021-07-10 LAB — B12 AND FOLATE PANEL
Folate: 9.3 ng/mL
Vitamin B-12: 923 pg/mL (ref 200–1100)

## 2021-07-10 LAB — TSH: TSH: 0.52 mIU/L (ref 0.40–4.50)

## 2021-07-12 ENCOUNTER — Encounter: Payer: Self-pay | Admitting: Student

## 2021-07-12 ENCOUNTER — Encounter: Payer: Self-pay | Admitting: Internal Medicine

## 2021-07-12 ENCOUNTER — Ambulatory Visit: Payer: No Typology Code available for payment source | Admitting: Student

## 2021-07-12 DIAGNOSIS — F411 Generalized anxiety disorder: Secondary | ICD-10-CM

## 2021-07-12 DIAGNOSIS — I10 Essential (primary) hypertension: Secondary | ICD-10-CM

## 2021-07-12 MED ORDER — DIAZEPAM 10 MG PO TABS: ORAL_TABLET | ORAL | 1 refills | Status: DC

## 2021-07-12 MED ORDER — AMLODIPINE BESYLATE 10 MG PO TABS: 10.0000 mg | ORAL_TABLET | Freq: Every day | ORAL | 3 refills | Status: DC

## 2021-07-12 NOTE — Progress Notes (Signed)
Primary Physician/Referring:  Margaree Mackintosh, MD  Patient ID: Arvil Persons, male    DOB: 11-24-1975, 46 y.o.   MRN: [id number]  Chief Complaint  Patient presents with   Hypertension   Follow-up   HPI:    HERVIE MONJARAZ  is a 46 y.o. Caucasian male with hypertension, extreme anxiety, mild  G6PD deficiency, who has responded well to blood pressure control with lisinopril/HCT, HCT utilized in spite of G6PD deficiency at a low dose which he is tolerating for years now. BP without lisinopril/HCT was uncontrolled.  Due to  atypical chest pain and elevated coronary calcium score that was done in 2018, he underwent coronary CT angiogram on 12/07/2018 revealing calcium score of 83 in the 94th percentile, proximal LAD less than 50% stenosis not hemodynamically significant.  He was therefore started on statin and beta-blocker therapy.  Due to episodes of dyspnea on exertion and fatigue patient recently underwent repeat coronary CTA which revealed no significant coronary artery stenosis, total calcium score 150.   Patient was last seen in our office 04/27/2021 at which time ordered repeat echocardiogram, otherwise no changes were made.Echocardiogram revealed normal LVEF, moderate LVH and no significant valvular disease.  Patient now presents for urgent visit at his request with concerns of elevated blood pressure.  Patient has noticed over the last several weeks his blood pressure has been uncontrolled.  Episodes of this are likely secondary to anxiety, however there is certainly room for improvement of blood pressure management overall.  Defer management of anxiety to PCP.  Past Medical History:  Diagnosis Date   Anal fissure    Anxiety    Depression    G6P deficiency (glucose-6-phosphatase deficiency) (HCC)    Hypertension    Mild hyperlipidemia    Pneumonia    Past Surgical History:  Procedure Laterality Date   APPENDECTOMY     CYSTECTOMY  2008   cyst removal from left armpit   Social  History   Tobacco Use   Smoking status: Former    Packs/day: 0.50    Years: 24.00    Pack years: 12.00    Types: Cigarettes    Quit date: 2013    Years since quitting: 10.4   Smokeless tobacco: Never  Substance Use Topics   Alcohol use: Yes    Comment: occasional  Marital Status: Married    ROS  Review of Systems  Constitutional: Negative for malaise/fatigue and weight gain.  Cardiovascular:  Negative for chest pain, claudication, dyspnea on exertion, leg swelling, near-syncope, orthopnea, palpitations, paroxysmal nocturnal dyspnea and syncope.  Neurological:  Negative for dizziness.  Psychiatric/Behavioral:  The patient is nervous/anxious.   Objective      07/12/2021   11:50 AM 07/12/2021   11:48 AM 07/09/2021    9:35 AM  Vitals with BMI  Height  6\' 2"  6\' 2"   Weight  287 lbs 282 lbs 8 oz  BMI  36.83 36.26  Systolic 143 154 528  Diastolic 97 96 86  Pulse 74 76 76    Blood pressure (!) 143/97, pulse 74, temperature 98 F (36.7 C), resp. rate 16, height 6\' 2"  (1.88 m), weight 287 lb (130.2 kg), SpO2 96 %. Body mass index is 36.85 kg/m.   Physical Exam Vitals reviewed.  Constitutional:      Comments: He is well-built and moderately obese in no acute distress.  Neck:     Thyroid: No thyromegaly.     Vascular: No JVD.  Cardiovascular:     Rate  and Rhythm: Normal rate and regular rhythm.     Pulses: Intact distal pulses.          Carotid pulses are 2+ on the right side and 2+ on the left side.      Radial pulses are 2+ on the right side and 2+ on the left side.       Femoral pulses are 2+ on the right side and 2+ on the left side.      Dorsalis pedis pulses are 2+ on the right side and 2+ on the left side.       Posterior tibial pulses are 2+ on the right side and 2+ on the left side.     Heart sounds: Normal heart sounds. No murmur heard.   No gallop.  Pulmonary:     Effort: Pulmonary effort is normal.     Breath sounds: Normal breath sounds.  Musculoskeletal:      Right lower leg: No edema.     Left lower leg: No edema.  Neurological:     Mental Status: He is alert.  Physical exam unchanged compared to previous office visit.  Radiology: No results found.  Laboratory examination:   G6PD deficiency 01/25/18: G-6-PD, Quant 4.6 - 13.5 U/g Hb <.3Low    Recent Labs    03/08/21 0902 03/08/21 1123 07/09/21 1113  NA 142  --  138  K 4.1  --  3.9  CL 100  --  99  CO2 29  --  28  GLUCOSE 105*  --  104*  BUN 9  --  12  CREATININE 0.96 1.00 0.87  CALCIUM 9.8  --  9.7      Latest Ref Rng & Units 07/09/2021   11:13 AM 03/08/2021   11:23 AM 03/08/2021    9:02 AM  CMP  Glucose 65 - 99 mg/dL 213    086    BUN 7 - 25 mg/dL 12    9    Creatinine 5.78 - 1.29 mg/dL 4.69   6.29   5.28    Sodium 135 - 146 mmol/L 138    142    Potassium 3.5 - 5.3 mmol/L 3.9    4.1    Chloride 98 - 110 mmol/L 99    100    CO2 20 - 32 mmol/L 28    29    Calcium 8.6 - 10.3 mg/dL 9.7    9.8    Total Protein 6.1 - 8.1 g/dL 7.2    7.1    Total Bilirubin 0.2 - 1.2 mg/dL 1.2    0.9    Alkaline Phos 44 - 121 IU/L   79    AST 10 - 40 U/L 32    20    ALT 9 - 46 U/L 52    27        Latest Ref Rng & Units 07/09/2021   11:13 AM 03/08/2021    9:02 AM 09/06/2019    9:48 AM  CBC  WBC 3.8 - 10.8 Thousand/uL 7.9   7.0   6.2    Hemoglobin 13.2 - 17.1 g/dL 41.3   24.4   01.0    Hematocrit 38.5 - 50.0 % 49.2   45.4   44.3    Platelets 140 - 400 Thousand/uL 209   213   205     Lipid Panel     Component Value Date/Time   CHOL 132 07/09/2021 1113   CHOL 128 03/08/2021 0902   TRIG 170 (  H) 07/09/2021 1113   HDL 40 07/09/2021 1113   HDL 46 03/08/2021 0902   CHOLHDL 3.3 07/09/2021 1113   VLDL 24.0 04/18/2011 0904   LDLCALC 67 07/09/2021 1113   HEMOGLOBIN A1C Lab Results  Component Value Date   HGBA1C 4.9 03/08/2021   TSH Recent Labs    03/08/21 0902 07/09/21 1113  TSH 0.511 0.52   Allergies   Allergies  Allergen Reactions   Penicillins Other (See Comments)     UNKNOWN/childhood     Medications Prior to Visit:   Outpatient Medications Prior to Visit  Medication Sig Dispense Refill   AMBULATORY NON FORMULARY MEDICATION Glutathime injection once week     AMBULATORY NON FORMULARY MEDICATION Immunity injection once a week     AMBULATORY NON FORMULARY MEDICATION Biotin injection once a week     AMBULATORY NON FORMULARY MEDICATION Vitamin D injection once a week     cetirizine (ZYRTEC) 10 MG tablet TAKE 1 TABLET BY MOUTH EVERY DAY 90 tablet 3   Coenzyme Q10 (COQ10 PO) Take by mouth daily.     cyanocobalamin (,VITAMIN B-12,) 1000 MCG/ML injection Inject 1,000 mcg into the muscle once a week.     Lactobacillus Rhamnosus, GG, (CULTURELLE IMMUNITY SUPPORT PO) Take 1 tablet by mouth once a week.     lisinopril-hydrochlorothiazide (ZESTORETIC) 20-12.5 MG tablet Take 1 tablet by mouth every morning. 90 tablet 3   metoprolol tartrate (LOPRESSOR) 100 MG tablet Take 1 tablet (100 mg total) by mouth 2 (two) times daily. 180 tablet 3   risperiDONE microspheres (RISPERDAL CONSTA) 25 MG injection Inject 25 mg into the muscle once a week.     rosuvastatin (CRESTOR) 10 MG tablet Take 1 tablet (10 mg total) by mouth daily. 90 tablet 3   testosterone enanthate (DELATESTRYL) 200 MG/ML injection Inject into the muscle once a week. For IM use only     venlafaxine XR (EFFEXOR-XR) 150 MG 24 hr capsule TAKE 1 CAPSULE BY MOUTH DAILY WITH BREAKFAST. 90 capsule 2   amLODipine (NORVASC) 5 MG tablet TAKE 1 TABLET (5 MG TOTAL) BY MOUTH DAILY. 90 tablet 3   diazepam (VALIUM) 10 MG tablet For anxiety 30 tablet 5   No facility-administered medications prior to visit.   Final Medications at End of Visit    Current Meds  Medication Sig   AMBULATORY NON FORMULARY MEDICATION Glutathime injection once week   AMBULATORY NON FORMULARY MEDICATION Immunity injection once a week   AMBULATORY NON FORMULARY MEDICATION Biotin injection once a week   AMBULATORY NON FORMULARY MEDICATION Vitamin  D injection once a week   cetirizine (ZYRTEC) 10 MG tablet TAKE 1 TABLET BY MOUTH EVERY DAY   Coenzyme Q10 (COQ10 PO) Take by mouth daily.   cyanocobalamin (,VITAMIN B-12,) 1000 MCG/ML injection Inject 1,000 mcg into the muscle once a week.   Lactobacillus Rhamnosus, GG, (CULTURELLE IMMUNITY SUPPORT PO) Take 1 tablet by mouth once a week.   lisinopril-hydrochlorothiazide (ZESTORETIC) 20-12.5 MG tablet Take 1 tablet by mouth every morning.   metoprolol tartrate (LOPRESSOR) 100 MG tablet Take 1 tablet (100 mg total) by mouth 2 (two) times daily.   risperiDONE microspheres (RISPERDAL CONSTA) 25 MG injection Inject 25 mg into the muscle once a week.   rosuvastatin (CRESTOR) 10 MG tablet Take 1 tablet (10 mg total) by mouth daily.   testosterone enanthate (DELATESTRYL) 200 MG/ML injection Inject into the muscle once a week. For IM use only   venlafaxine XR (EFFEXOR-XR) 150 MG 24 hr capsule TAKE  1 CAPSULE BY MOUTH DAILY WITH BREAKFAST.   [DISCONTINUED] amLODipine (NORVASC) 5 MG tablet TAKE 1 TABLET (5 MG TOTAL) BY MOUTH DAILY.   [DISCONTINUED] diazepam (VALIUM) 10 MG tablet For anxiety   Cardiac Studies:   Treadmill exercise stress test 02/24/2017: Indication: Screening for CAD, hypertension The patient exercised on Bruce protocol for 9:44 min. Patient achieved 11.36 METS and reached HR 185 bpm, which is 102 % of maximum age-predicted HR. Stress test terminated due to fatigue. Resting EKG demonstrates Normal sinus rhythm. ST Changes: With peak exercise there was no ST-T changes of ischemia.   Chest Pain: none. BP Response to Exercise: Normal resting BP- appropriate response. Hypertensive at rest 164/102 mm Hg. However in recovery, patient developed severe hypotension with BP 80/60 mm Hg. Took 10 minutes before he recuperated with normal BP and dizziness subsided. No arrhythmias. Rec: Patient's BP response is probably normal but exaggerated response with vasodilatation post exercise. Excellent effort  without chest pain or dyspnea.  Recommend echocardiogram to evaluate LV function.  Coronary CT angiogram 12/07/2018: 1. Coronary calcium score of 83.6. Calcium score is 94 percentile for subjects of the same age, gender and race/ethnicity. Please note that the MESA database starts at age 56.  2. Probably mild <50% ostial and proximal LAD stenosis. There was no evidence for hemodynamically significant stenosis by FFR. Right dominant circulation, normal circumflex and right coronary artery. 3.  No significant extracardiac abnormality.  Coronary CTA 03/10/2021: LM 0 LAD 143 LCx 0 RCA 7.17. Total coronary calcium score 150.17. [place] database not available for age <45. Assuming 45 years of age, patient is in 43 percentile for gender and race matched individual. LM: Normal LAD: Large vessel wraps around the apex, moderate 50 to 69% stenosis due to eccentric mixed plaque in the proximal/mid LAD. CT FFR 0.99, not hemodynamically significant. LCx normal, 2 large obtuse marginals, no stenosis. RCA: Large vessel, no significant plaque burden. LV grossly normal in size and no stigmata of prior infarction. Aorta is normal in measurement, no significant atherosclerotic changes. Aorta is normal in size. Visualized noncardiac structures within normal limits. Compared to 05/10/2016, patient was in the 94th percentile.  Echocardiogram 05/13/2021:  Normal LV systolic function with visual EF 55-60%. Left ventricle cavity  is normal in size. Moderate left ventricular hypertrophy. Normal global  wall motion. Normal diastolic filling pattern, normal LAP. Calculated EF 57%.  No significant valvular heart disease.  Compared to study 02/28/2017 mild MR/TR are now resolved, otherwise no significant change.   EKG  EKG 04/27/2021: Normal sinus rhythm with rate of 57 bpm, normal axis.  No evidence of ischemia, normal EKG.  EKG 04/30/2020: Normal sinus rhythm at rate of 65 bpm, normal axis.  No evidence of ischemia,  normal EKG.  No significant change from 11/12/2018.  Assessment     ICD-10-CM   1. Primary hypertension  I10 amLODipine (NORVASC) 10 MG tablet     Meds ordered this encounter  Medications   amLODipine (NORVASC) 10 MG tablet    Sig: Take 1 tablet (10 mg total) by mouth daily.    Dispense:  90 tablet    Refill:  3   Medications Discontinued During This Encounter  Medication Reason   amLODipine (NORVASC) 5 MG tablet Reorder    No orders of the defined types were placed in this encounter.   Recommendations:   Farhan PABLITO PEPIN is a 46 y.o. Caucasian male with hypertension, extreme anxiety, mild  G6PD deficiency, who has responded well to blood pressure  control with lisinopril/HCT, HCT utilized in spite of G6PD deficiency at a low dose which he is tolerating for years now. BP without lisinopril/HCT was uncontrolled.  Due to  atypical chest pain and elevated coronary calcium score that was done in 2018, he underwent coronary CT angiogram on 12/07/2018 revealing calcium score of 83 in the 94th percentile, proximal LAD less than 50% stenosis not hemodynamically significant.  He was therefore started on statin and beta-blocker therapy.  Due to episodes of dyspnea on exertion and fatigue patient recently underwent repeat coronary CTA which revealed no significant coronary artery stenosis, total calcium score 150.   Patient was last seen in our office 04/27/2021 at which time ordered repeat echocardiogram, otherwise no changes were made.Echocardiogram revealed normal LVEF, moderate LVH and no significant valvular disease.  Patient now presents for urgent visit at his request with concerns of elevated blood pressure.  Given that blood pressure is elevated above goal we will increase amlodipine from 5 mg to 10 mg p.o. daily.  Also advised patient regarding the importance of increasing physical activity, weight loss,DASH diet.  Patient will continue to monitor his blood pressure on a daily basis at home.   Patient will notify our office if blood pressure remains >130/80 mmHg on average.  Reviewed and discussed results of echocardiogram, details above.  Follow-up in 3 months as previously scheduled, sooner if needed.   Rayford Halsted, PA-C 07/12/2021, 3:16 PM Office: [phone]

## 2021-07-12 NOTE — Telephone Encounter (Signed)
Patient seen monday

## 2021-07-27 ENCOUNTER — Telehealth: Payer: Self-pay | Admitting: Internal Medicine

## 2021-07-27 DIAGNOSIS — Z0289 Encounter for other administrative examinations: Secondary | ICD-10-CM

## 2021-07-27 NOTE — Telephone Encounter (Signed)
Called and let Zacharee know form has been completed and faxed back to [phone]

## 2021-07-27 NOTE — Telephone Encounter (Signed)
This message was sent via [employer], a product from [employer] http://www.[employer].com/                    -------Fax Transmission Report-------  To:               Recipient at [phone] Subject:          FW: Hp Scans Result:           The transmission was successful. Explanation:      All Pages Ok Pages Sent:       3 Connect Time:     2 minutes, 22 seconds Transmit Time:    07/27/2021 16:33 Transfer Rate:    14400 Status Code:      0000 Retry Count:      0 Job Id:           [id number] Unique Id:        [id number] Fax Line:         4 Fax Server:       [employer]

## 2021-07-27 NOTE — Telephone Encounter (Signed)
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 He has gone to the clinic for that part he just needs the form filled out for medication. Last year we did office visit.

## 2021-07-30 ENCOUNTER — Other Ambulatory Visit: Payer: Self-pay | Admitting: Internal Medicine

## 2021-08-18 ENCOUNTER — Telehealth: Payer: Self-pay | Admitting: Neurology

## 2021-08-18 ENCOUNTER — Ambulatory Visit: Payer: No Typology Code available for payment source | Admitting: Neurology

## 2021-08-18 NOTE — Telephone Encounter (Signed)
Pt cancelled appt due to a work schedule conflict

## 2021-08-29 ENCOUNTER — Encounter: Payer: Self-pay | Admitting: Certified Registered Nurse Anesthetist

## 2021-08-30 ENCOUNTER — Encounter: Payer: Self-pay | Admitting: Internal Medicine

## 2021-09-06 ENCOUNTER — Ambulatory Visit (AMBULATORY_SURGERY_CENTER): Payer: No Typology Code available for payment source | Admitting: Internal Medicine

## 2021-09-06 ENCOUNTER — Encounter: Payer: Self-pay | Admitting: Internal Medicine

## 2021-09-06 VITALS — BP 125/88 | HR 78 | Temp 98.0°F | Resp 10 | Ht 74.0 in | Wt 287.0 lb

## 2021-09-06 DIAGNOSIS — D122 Benign neoplasm of ascending colon: Secondary | ICD-10-CM | POA: Diagnosis not present

## 2021-09-06 DIAGNOSIS — Z1211 Encounter for screening for malignant neoplasm of colon: Secondary | ICD-10-CM | POA: Diagnosis present

## 2021-09-06 MED ORDER — SODIUM CHLORIDE 0.9 % IV SOLN: 500.0000 mL | Freq: Once | INTRAVENOUS | Status: DC

## 2021-09-06 NOTE — Progress Notes (Signed)
Called to room to assist during endoscopic procedure.  Patient ID and intended procedure confirmed with present staff. Received instructions for my participation in the procedure from the performing physician.  

## 2021-09-06 NOTE — Patient Instructions (Addendum)
I found and removed one tiny polyp that looks benign. I do suspect it is pre-cancerous (potential to become cancer if not removed like it was).  I will let you know pathology results and when to have another routine colonoscopy by mail and/or My Chart.  I appreciate the opportunity to care for you. Gatha Mayer, MD, [facility]  Information on polyps given to you today.  Await pathology results.  Resume previous diet and medications.  YOU HAD AN ENDOSCOPIC PROCEDURE TODAY AT [facility]:   Refer to the procedure report that was given to you for any specific questions about what was found during the examination.  If the procedure report does not answer your questions, please call your gastroenterologist to clarify.  If you requested that your care partner not be given the details of your procedure findings, then the procedure report has been included in a sealed envelope for you to review at your convenience later.  YOU SHOULD EXPECT: Some feelings of bloating in the abdomen. Passage of more gas than usual.  Walking can help get rid of the air that was put into your GI tract during the procedure and reduce the bloating. If you had a lower endoscopy (such as a colonoscopy or flexible sigmoidoscopy) you may notice spotting of blood in your stool or on the toilet paper. If you underwent a bowel prep for your procedure, you may not have a normal bowel movement for a few days.  Please Note:  You might notice some irritation and congestion in your nose or some drainage.  This is from the oxygen used during your procedure.  There is no need for concern and it should clear up in a day or so.  SYMPTOMS TO REPORT IMMEDIATELY:  Following lower endoscopy (colonoscopy or flexible sigmoidoscopy):  Excessive amounts of blood in the stool  Significant tenderness or worsening of abdominal pains  Swelling of the abdomen that is new, acute  Fever of 100F or higher  For urgent or emergent issues,  a gastroenterologist can be reached at any hour by calling [phone]. Do not use MyChart messaging for urgent concerns.    DIET:  We do recommend a small meal at first, but then you may proceed to your regular diet.  Drink plenty of fluids but you should avoid alcoholic beverages for 24 hours.  ACTIVITY:  You should plan to take it easy for the rest of today and you should NOT DRIVE or use heavy machinery until tomorrow (because of the sedation medicines used during the test).    FOLLOW UP: Our staff will call the number listed on your records the next business day following your procedure.  We will call around 7:15- 8:00 am to check on you and address any questions or concerns that you may have regarding the information given to you following your procedure. If we do not reach you, we will leave a message.  If you develop any symptoms (ie: fever, flu-like symptoms, shortness of breath, cough etc.) before then, please call [phone].  If you test positive for Covid 19 in the 2 weeks post procedure, please call and report this information to [place].    If any biopsies were taken you will be contacted by phone or by letter within the next 1-3 weeks.  Please call us at [phone] if you have not heard about the biopsies in 3 weeks.    SIGNATURES/CONFIDENTIALITY: You and/or your care partner have signed paperwork which will be  entered into your electronic medical record.  These signatures attest to the fact that that the information above on your After Visit Summary has been reviewed and is understood.  Full responsibility of the confidentiality of this discharge information lies with you and/or your care-partner.

## 2021-09-06 NOTE — Op Note (Signed)
[facility] Patient Name: Paul Freeman Procedure Date: 09/06/2021 8:36 AM MRN: [id number] Endoscopist: Iva Boop , MD Age: 46 Referring MD:  Date of Birth: 1975-08-31 Gender: Male Account #: [id number] Procedure:                Colonoscopy Indications:              Screening for colorectal malignant neoplasm, This                            is the patient's first colonoscopy Medicines:                Monitored Anesthesia Care Procedure:                Pre-Anesthesia Assessment:                           - Prior to the procedure, a History and Physical                            was performed, and patient medications and                            allergies were reviewed. The patient's tolerance of                            previous anesthesia was also reviewed. The risks                            and benefits of the procedure and the sedation                            options and risks were discussed with the patient.                            All questions were answered, and informed consent                            was obtained. Prior Anticoagulants: The patient has                            taken no previous anticoagulant or antiplatelet                            agents. ASA Grade Assessment: II - A patient with                            mild systemic disease. After reviewing the risks                            and benefits, the patient was deemed in                            satisfactory condition to undergo the procedure.  After obtaining informed consent, the colonoscope                            was passed under direct vision. Throughout the                            procedure, the patient's blood pressure, pulse, and                            oxygen saturations were monitored continuously. The                            CF [id number] #[id number] was introduced through the anus                            and advanced to the the  cecum, identified by                            appendiceal orifice and ileocecal valve. The                            colonoscopy was somewhat difficult due to                            significant looping. Successful completion of the                            procedure was aided by straightening and shortening                            the scope to obtain bowel loop reduction and                            applying abdominal pressure. The patient tolerated                            the procedure well. The quality of the bowel                            preparation was adequate. The bowel preparation                            used was Miralax via split dose instruction. The                            ileocecal valve, appendiceal orifice, and rectum                            were photographed. Scope In: 8:45:07 AM Scope Out: 9:07:43 AM Scope Withdrawal Time: 0 hours 18 minutes 51 seconds  Total Procedure Duration: 0 hours 22 minutes 36 seconds  Findings:                 The perianal and digital rectal examinations were  normal. Pertinent negatives include normal prostate                            (size, shape, and consistency).                           A 3 mm polyp was found in the proximal ascending                            colon. The polyp was sessile. The polyp was removed                            with a cold snare. Resection and retrieval were                            complete. Verification of patient identification                            for the specimen was done. Estimated blood loss was                            minimal.                           The exam was otherwise without abnormality on                            direct and retroflexion views. Complications:            No immediate complications. Estimated Blood Loss:     Estimated blood loss was minimal. Impression:               - One 3 mm polyp in the proximal ascending colon,                             removed with a cold snare. Resected and retrieved.                           - The examination was otherwise normal on direct                            and retroflexion views. Recommendation:           - Patient has a contact number available for                            emergencies. The signs and symptoms of potential                            delayed complications were discussed with the                            patient. Return to normal activities tomorrow.  Written discharge instructions were provided to the                            patient.                           - Resume previous diet.                           - Continue present medications.                           - Await pathology results.                           - Repeat colonoscopy is recommended. The                            colonoscopy date will be determined after pathology                            results from today's exam become available for                            review. Iva Boop, MD 09/06/2021 9:13:20 AM This report has been signed electronically.

## 2021-09-06 NOTE — Progress Notes (Signed)
[facility] History and Physical   Primary Care Physician:  Elby Showers, MD   Reason for Procedure:   CRCA screening  Plan:    colonoscopy     HPI: Paul Freeman is a 46 y.o. male here for colon cancer screening exam   Past Medical History:  Diagnosis Date   Anal fissure    Anxiety    Depression    G6P deficiency (glucose-6-phosphatase deficiency) ([place])    Hypertension    Mild hyperlipidemia    Pneumonia     Past Surgical History:  Procedure Laterality Date   APPENDECTOMY     CYSTECTOMY  2008   cyst removal from left armpit    Prior to Admission medications   Medication Sig Start Date End Date Taking? Authorizing Provider  AMBULATORY NON FORMULARY MEDICATION Glutathime injection once week   Yes [provider]  AMBULATORY NON FORMULARY MEDICATION Immunity injection once a week   Yes [provider]  AMBULATORY NON FORMULARY MEDICATION Biotin injection once a week   Yes [provider]  AMBULATORY NON FORMULARY MEDICATION Vitamin D injection once a week   Yes [provider]  amLODipine (NORVASC) 10 MG tablet Take 1 tablet (10 mg total) by mouth daily. 07/12/21  Yes Cantwell, Celeste C, PA-C  Coenzyme Q10 (COQ10 PO) Take by mouth daily.   Yes [provider]  cyanocobalamin (,VITAMIN B-12,) 1000 MCG/ML injection Inject 1,000 mcg into the muscle once a week.   Yes [provider]  diazepam (VALIUM) 10 MG tablet For anxiety 07/12/21  Yes Baxley, Cresenciano Lick, MD  lisinopril-hydrochlorothiazide (ZESTORETIC) 20-12.5 MG tablet Take 1 tablet by mouth every morning. 07/07/21  Yes Adrian Prows, MD  metoprolol tartrate (LOPRESSOR) 100 MG tablet Take 1 tablet (100 mg total) by mouth 2 (two) times daily. 07/07/21  Yes Adrian Prows, MD  risperiDONE microspheres (RISPERDAL CONSTA) 25 MG injection Inject 25 mg into the muscle once a week.   Yes [provider]  rosuvastatin (CRESTOR) 10 MG tablet Take 1 tablet (10 mg  total) by mouth daily. 07/07/21  Yes Adrian Prows, MD  testosterone enanthate (DELATESTRYL) 200 MG/ML injection Inject into the muscle once a week. For IM use only   Yes [provider]  venlafaxine XR (EFFEXOR-XR) 150 MG 24 hr capsule TAKE ONE CAPSULE BY MOUTH DAILY WITH BREAKFAST 07/31/21  Yes Baxley, Cresenciano Lick, MD  cetirizine (ZYRTEC) 10 MG tablet TAKE ONE TABLET BY MOUTH EVERY DAY Patient not taking: Reported on 09/06/2021 07/31/21   Elby Showers, MD  Lactobacillus Rhamnosus, GG, (CULTURELLE IMMUNITY SUPPORT PO) Take 1 tablet by mouth once a week.    [provider]    Current Outpatient Medications  Medication Sig Dispense Refill   AMBULATORY NON FORMULARY MEDICATION Glutathime injection once week     AMBULATORY NON FORMULARY MEDICATION Immunity injection once a week     AMBULATORY NON FORMULARY MEDICATION Biotin injection once a week     AMBULATORY NON FORMULARY MEDICATION Vitamin D injection once a week     amLODipine (NORVASC) 10 MG tablet Take 1 tablet (10 mg total) by mouth daily. 90 tablet 3   Coenzyme Q10 (COQ10 PO) Take by mouth daily.     cyanocobalamin (,VITAMIN B-12,) 1000 MCG/ML injection Inject 1,000 mcg into the muscle once a week.     diazepam (VALIUM) 10 MG tablet For anxiety 60 tablet 1   lisinopril-hydrochlorothiazide (ZESTORETIC) 20-12.5 MG tablet Take 1 tablet by mouth every morning. 90 tablet  3   metoprolol tartrate (LOPRESSOR) 100 MG tablet Take 1 tablet (100 mg total) by mouth 2 (two) times daily. 180 tablet 3   risperiDONE microspheres (RISPERDAL CONSTA) 25 MG injection Inject 25 mg into the muscle once a week.     rosuvastatin (CRESTOR) 10 MG tablet Take 1 tablet (10 mg total) by mouth daily. 90 tablet 3   testosterone enanthate (DELATESTRYL) 200 MG/ML injection Inject into the muscle once a week. For IM use only     venlafaxine XR (EFFEXOR-XR) 150 MG 24 hr capsule TAKE ONE CAPSULE BY MOUTH DAILY WITH BREAKFAST 90 capsule 0   cetirizine (ZYRTEC) 10 MG  tablet TAKE ONE TABLET BY MOUTH EVERY DAY (Patient not taking: Reported on 09/06/2021) 90 tablet 0   Lactobacillus Rhamnosus, GG, (CULTURELLE IMMUNITY SUPPORT PO) Take 1 tablet by mouth once a week.     Current Facility-Administered Medications  Medication Dose Route Frequency Provider Last Rate Last Admin   0.9 %  sodium chloride infusion  500 mL Intravenous Once Gatha Mayer, MD        Allergies as of 09/06/2021 - Review Complete 09/06/2021  Allergen Reaction Noted   Penicillins Other (See Comments) 06/02/2010    Family History  Problem Relation Age of Onset   Breast cancer Mother    Arthritis Mother    Hypertension Father    Atrial fibrillation Father    Prostate cancer Father    Multiple sclerosis Brother    Colon cancer Neg Hx    Stomach cancer Neg Hx    Pancreatic cancer Neg Hx    Esophageal cancer Neg Hx    Liver disease Neg Hx     Social History   Socioeconomic History   Marital status: Married    Spouse name: Not on file   Number of children: 2   Years of education: Not on file   Highest education level: Not on file  Occupational History   Not on file  Tobacco Use   Smoking status: Former    Packs/day: 0.50    Years: 24.00    Total pack years: 12.00    Types: Cigarettes    Quit date: 2013    Years since quitting: 10.5   Smokeless tobacco: Never  Vaping Use   Vaping Use: Never used  Substance and Sexual Activity   Alcohol use: Yes    Comment: occasional   Drug use: No   Sexual activity: Not Currently  Other Topics Concern   Not on file  Social History Narrative   Married, wife works for [employer] doing medical review work    2 children   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  Physical Exam: Vital signs BP (!) 145/86   Pulse 90   Temp 98 F (36.7 C) (Temporal)   Resp 12   Ht '6\' 2"'$  (1.88 m)   Wt 287 lb (130.2 kg)   SpO2 95%   BMI 36.85 kg/m   General:   Alert,  Well-developed, well-nourished, pleasant and cooperative  in NAD Lungs:  Clear throughout to auscultation.   Heart:  Regular rate and rhythm; no murmurs, clicks, rubs,  or gallops. Abdomen:  Soft, nontender and nondistended. Normal bowel sounds.   Neuro/Psych:  Alert and cooperative. Normal mood and affect. A and O x 3   '@Gurtej Noyola'$  Simonne Maffucci, MD, [facility] [phone] (pager) 09/06/2021 8:36 AM@

## 2021-09-06 NOTE — Progress Notes (Signed)
Report given to PACU, vss 

## 2021-09-07 ENCOUNTER — Telehealth: Payer: Self-pay

## 2021-09-07 NOTE — Telephone Encounter (Signed)
Attempted f/u call. No answer, left VM. 

## 2021-09-13 ENCOUNTER — Encounter: Payer: Self-pay | Admitting: Internal Medicine

## 2021-09-13 DIAGNOSIS — Z86010 Personal history of colonic polyps: Secondary | ICD-10-CM

## 2021-09-13 DIAGNOSIS — Z860101 Personal history of adenomatous and serrated colon polyps: Secondary | ICD-10-CM | POA: Insufficient documentation

## 2021-09-13 HISTORY — DX: Personal history of adenomatous and serrated colon polyps: Z86.0101

## 2021-09-27 ENCOUNTER — Ambulatory Visit: Payer: No Typology Code available for payment source | Admitting: Neurology

## 2021-09-28 ENCOUNTER — Telehealth: Payer: Self-pay | Admitting: Neurology

## 2021-09-28 ENCOUNTER — Encounter: Payer: Self-pay | Admitting: Neurology

## 2021-09-28 ENCOUNTER — Ambulatory Visit: Payer: No Typology Code available for payment source | Admitting: Neurology

## 2021-09-28 VITALS — BP 128/80 | HR 84 | Ht 74.0 in | Wt 283.0 lb

## 2021-09-28 DIAGNOSIS — R29818 Other symptoms and signs involving the nervous system: Secondary | ICD-10-CM

## 2021-09-28 NOTE — Addendum Note (Signed)
Addended by: Larey Seat on: 09/28/2021 10:59 AM   Modules accepted: Orders

## 2021-09-28 NOTE — Telephone Encounter (Signed)
[employer] sent to GI they obtain auth and will call the patient to schedule 

## 2021-09-28 NOTE — Addendum Note (Signed)
Addended by: Darleen Crocker on: 09/28/2021 11:10 AM   Modules accepted: Orders

## 2021-09-28 NOTE — Progress Notes (Signed)
0.3 level of  G-6-PD enzyme.    Provider:  Larey Seat, MD   Primary Care Physician:  Paul Showers, MD, but referral through Paul Paul Gip, MD.    Referring Provider: Renold Genta, MD   Chief Complaint  Patient presents with   Sleep Consult    Np for dyseasthesias   09-28-2021: CD : New referral form Paul Paul Freeman this time:  as new patient after more than 3 year hiatus, this time he is referred for L sided paresthesia. Pt has had issues with his disc before causing pain and numbness. This time pt is only experiencing numbness. His Brother was dx with MS and follows Paul. Felecia Freeman.  He had in 2020 a right sided and very painful dyseasthesias, radiculopathic-  , he saw Paul Ellene Route and underwent discectomy, replacement by synthetic disc , Cervical C 5-6. Symptoms resolved.  Now he presents with left facial numbness involving full thickness of the cheek and gums, and upper lip. He reports a near syncope when he laughs very deeply, and that started 6 months ago. A tingling sensation as if soda tingles under the skin, rising to the forehead and crown.  He will let it pass , within seconds, has never fainted or fallen.  So far, this new sensation has not been worked up by imaging.    Cardiology note: Paul Freeman  is a 46 y.o. Caucasian male with hypertension, extreme anxiety, mild  G6PD deficiency, who has responded well to blood pressure control with lisinopril/HCT, HCT utilized in spite of G6PD deficiency at a low dose which he is tolerating for years now. BP without lisinopril/HCT was uncontrolled.   Due to  atypical chest pain and elevated coronary calcium score that was done in 2018, he underwent coronary CT angiogram on 12/07/2018 revealing calcium score of 83 in the 94th percentile, proximal LAD less than 50% stenosis not hemodynamically significant.  He was therefore started on statin and beta-blocker therapy.  Due to episodes of dyspnea on exertion and fatigue patient recently underwent repeat  coronary CTA which revealed no significant coronary artery stenosis, total calcium score 150.      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 The disease is known to cause LFT elevation , sometimes even  jaundice and nausea, and severe fatigue, often followed by abdominal pain and flank pain and myalgia.    Paul Freeman is a 46 y.o. male patient , who was first seen on 01-03-2018 upon referral from Paul. Einar Freeman. I have the pleasure of meeting today for the first time [name], originally from [place], [place].  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 Alone. Rm 11. Patient mentioned that he has been dealing with disturbed sleep and fatigue for 25 years. Third sleep evaluation in 10 years - "exhausted 24/ 7, chronic pain 24/7, weak 24/7 ."   Paul Freeman reports that he cannot remember ever having restorative or refreshing sleep, he goes to bed he knows he sleeps but when he wakes up he is as fatigued and exhausted as he felt the evening before.   He had sleep 3 preceding sleep evaluations, the first one 12 -15 years ago in [place], [place], by Paul [facility] Paul. Tonia Freeman. He was given a sleep study and told to use CPAP - and he couldn' t tolerate CPAP. Paul Paul Freeman had seen  him afterwards for a new evaluation, ONO was normal, ended in a referral to Paul Freeman. He started on anxiety medications but he felt his sleep did not benefit. He also felt sore all the time.  He is not sure if he had another sleep test since. Paul Freeman tested him for fatigue - and found no evidence of any cardiac disease- normal Echo, EKG and he referred here.  He started him on cholesterol medication. He mentioned his generalized anxiety disorder, possible ADD, ADHD.  The patient was tested for attention deficit disorder by a psychologist and the  results are negative..   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  He usually comes home for dinner at about 6 PM, he will spend the evening with his wife and family, he goes to the bedroom at 10 PM but he is not asleep before midnight, and he watches TV in his bedroom. His wife is equally unhappy about this habit but she has told him that he snores less than he used to.  He usually goes to sleep on his left side, he has one pillow that he embraces or cradles as he sleeps.  He wakes up at least once in the morning at 4 AM for bathroom break. He used not to dream for many years.  Rises at 6.45 with difficulties- his wife tries to wake him from before 6 AM on. He drives the children to school.  He is groggy and achy until 10 AM- its better after that.   Sleep Medical history:  OSA 12 years ago, couldn't tolerate CPAP- anxiety, ADD(?). Coronary calcification.  Detailed review of Paul. Irven Freeman notes.   Had elevated LFTs in 2005- " through the roof" north of 800 units. Lasted for 6 month , trigger unknown, resolved. Nausea stayed.   Brother with MS, sees Paul.Sater, all paternal male 71 members had severe fatigue, achiness and soreness.- starting in their teens or twenties.  Mother of [place] ancestry. .  Social history: married, self employed, 2 children 36 year old daughter, 24 year old son, former smoker - quit 2013- ETOH , 1-2 drinks a month, caffeine - coffee 2-6 cups a day ( Espresso).   Review of Systems: Out of a complete 14 system review, the patient complains of only the following symptoms, and all other reviewed systems are negative. Snoring, fatigue- no naps.   Epworth Sleepiness Score : 9/ 24  , Fatigue severity score 41/ 63 points   , depression score- n/a     IMPRESSION:  1. Primary Snoring- Loud, but not with  significant apnea,  hypoxemia, EKG changes. Snoring can be addressed by dental  device.  2. High Sleep efficiency without N3 sleep. 3. Reduced REM sleep in proportion to NREM sleep- likely a side  effect of medication. 4. Direct arousals out of REM sleep (Dream induced?) 5. Unusual was the motionless sleep as seen on video and lack of  activation of EMG channels.   Social History   Socioeconomic History   Marital status: Married    Spouse name: Not on file   Number of children: Not on file   Years of education: Not on file   Highest education level: Not on file  Occupational History   Not on file  Social Needs   Financial resource strain: Not on file  Food insecurity:    Worry: Not on file    Inability: Not on file   Transportation needs:    Medical: Not on file    Non-medical: Not on file  Tobacco Use   Smoking status: Former Smoker    Packs/day: 0.50    Years: 24.00    Pack years: 12.00    Last attempt to quit: 2013    Years since quitting: 6.9   Smokeless tobacco: Never Used  Substance and Sexual Activity   Alcohol use: Yes    Comment: occ   Drug use: No Quit smoking 2013    Caffeine  Espresso - up to 8 a day!!  Lifestyle   Physical activity:    Days per week:     Minutes per session:    Stress:   Relationships   Social connections:                                 Intimate partner violence:                   Forced sexual activity:   Other Topics Concern   Not on file  Social History Narrative   Not on file    Family History  Problem Relation Age of Onset   Breast cancer Mother    Arthritis Mother    Hypertension Father    Atrial fibrillation Father    Prostate cancer Father    Multiple sclerosis Brother    Colon cancer Neg Hx    Stomach cancer Neg Hx    Pancreatic cancer Neg Hx    Esophageal cancer Neg Hx    Liver disease Neg Hx     Past Medical History:  Diagnosis Date   Anal fissure    Anxiety    Depression    G6P deficiency  (glucose-6-phosphatase deficiency) ([place])    Hx of adenomatous polyp of colon 09/13/2021   Hypertension    Mild hyperlipidemia    Pneumonia     Past Surgical History:  Procedure Laterality Date   APPENDECTOMY     CYSTECTOMY  2008   cyst removal from left armpit    Current Outpatient Medications  Medication Sig Dispense Refill   AMBULATORY NON FORMULARY MEDICATION Glutathime injection once week     AMBULATORY NON FORMULARY MEDICATION Immunity injection once a week     AMBULATORY NON FORMULARY MEDICATION Biotin injection once a week     AMBULATORY NON FORMULARY MEDICATION Vitamin D injection once a week     amLODipine (NORVASC) 10 MG tablet Take 1 tablet (10 mg total) by mouth daily. 90 tablet 3   Coenzyme Q10 (COQ10 PO) Take by mouth daily.     cyanocobalamin (,VITAMIN B-12,) 1000 MCG/ML injection Inject 1,000 mcg into the muscle once a week.     diazepam (VALIUM) 10 MG tablet For anxiety (Patient taking differently: as needed for anxiety. For anxiety) 60 tablet 1   Lactobacillus Rhamnosus, GG, (CULTURELLE IMMUNITY SUPPORT PO) Take 1 tablet by mouth once a week.     lisinopril-hydrochlorothiazide (ZESTORETIC) 20-12.5 MG tablet Take 1 tablet by mouth every morning. 90 tablet 3   metoprolol tartrate (LOPRESSOR) 100 MG tablet Take 1 tablet (100 mg total) by mouth 2 (two) times daily. 180 tablet 3   risperiDONE microspheres (RISPERDAL CONSTA) 25 MG injection Inject 25 mg into the muscle once a week.     testosterone enanthate (DELATESTRYL) 200 MG/ML  injection Inject into the muscle once a week. For IM use only     venlafaxine XR (EFFEXOR-XR) 150 MG 24 hr capsule TAKE ONE CAPSULE BY MOUTH DAILY WITH BREAKFAST 90 capsule 0   rosuvastatin (CRESTOR) 10 MG tablet Take 1 tablet (10 mg total) by mouth daily. (Patient not taking: Reported on 09/28/2021) 90 tablet 3   No current facility-administered medications for this visit.    Allergies as of 09/28/2021 - Review Complete 09/28/2021  Allergen  Reaction Noted   Penicillins Other (See Comments) 06/02/2010    Vitals: BP 128/80   Pulse 84   Ht '6\' 2"'$  (1.88 m)   Wt 283 lb (128.4 kg)   BMI 36.34 kg/m  Last Weight:  Wt Readings from Last 1 Encounters:  09/28/21 283 lb (128.4 kg)   GNO:IBBC mass index is 36.34 kg/m.     Last Height:   Ht Readings from Last 1 Encounters:  09/28/21 '6\' 2"'$  (1.88 m)    Physical exam:  General: The patient is awake, alert and appears not in acute distress. The patient is well groomed. Head: Normocephalic, atraumatic. Neck is supple. Mallampati 2 elongated uvula. ,  neck circumference:18.25 . Nasal airflow congested ,Retrognathia is noted. .  Cardiovascular:  Regular rate and rhythm , without  murmurs or carotid bruit, and without distended neck veins. Respiratory: Lungs are clear to auscultation. Skin:  Without evidence of edema, or rash Trunk: BMI is 34.28. The patient's posture is stooped.   Neurologic exam : The patient is awake and alert, oriented to place and time.   Attention span & concentration ability appears normal.  Speech is fluent, without dysarthria, dysphonia or aphasia.  Mood and affect are aloof   Cranial nerves: Pupils are equal and briskly reactive to light.  Extraocular movements  in vertical and horizontal planes intact and without nystagmus. Visual fields by finger perimetry are intact. Hearing to finger rub intact. Rinne - Weber non-lateralizing.  Facial sensation intact to fine touch.Facial motor strength is symmetric and tongue and uvula move midline. Shoulder shrug was symmetrical.   Motor exam:   Normal tone, muscle bulk and symmetric strength in all extremities.  Sensory:  Fine touch, pinprick and vibration were tested in all extremities.   Left sided more sensitive to vibration.  Proprioception tested in the upper extremities was normal.  Coordination: Rapid alternating movements in the fingers/hands was normal. Finger-to-nose maneuver  normal without evidence  of ataxia, dysmetria or tremor. No pronator drift.   Gait and station: Patient walks without assistive device and is able unassisted to climb up to the exam table. Strength within normal limits.  Stance is stable and normal.  Toe and heel stand were tested . Turns with 3 Steps. Romberg testing is negative.  Deep tendon reflexes: in the  upper and lower extremities are symmetric and intact. Babinski maneuver response is  downgoing.    Assessment:  After physical and neurologic examination, review of laboratory studies,  Personal review of imaging studies, reports of other /same  Imaging studies, results of polysomnography and / or neurophysiology testing and pre-existing records as far as provided in visit., my assessment is   1)  dysesthesias of tingling , hypersensitivity than numbness, affecting the left fae and skull, the left arm to the 5 fingers and their tips. Rarely to the leg. He is not waking up with it, it arises when sitting down, not while walking .   PLAN:  I need to obtain MRI of brain and cervical  spine. I will also check B12, vit D and electrolyte panel.   The patient was advised of the nature of the diagnosed disorder, the treatment options and the  risks for general health and wellness arising from not treating the condition.   I spent more than 45 minutes of face to face time with the patient. Greater than 50% of time was spent in counseling and coordination of care. We have discussed the diagnosis and differential and I answered the patient's questions.     Rv after images are obtained - in 1-2 months   Paul Seat, MD 1/00/7121, 97:58 AM  Certified in Neurology by ABPN Certified in [place] by [facility] [address] [place], [place] [address]    Addendum 01-17-2018- still waiting for thalassemia panel!  Favism hemolytic /More than 200,000 [place] cases per year Treatment can help, but this condition can't be  cured Requires a medical diagnosis Lab tests or imaging always required Chronic: can last for years or be lifelong Glucose-6-phosphate dehydrogenase (G6PD) deficiency is an inherited condition usually occurring in males. It's more common in those of African and Mediterranean descent. Triggers include infections, stress, fava beans, aspirin, and other drugs. When symptoms are triggered, they include fever, dark urine, abdominal and back pain, fatigue, and pale skin. Most people recover in a few days without treatment. However, patients are at risk of recurrent episodes, so avoidance of triggers is critical.

## 2021-09-29 ENCOUNTER — Telehealth: Payer: Self-pay

## 2021-09-29 LAB — COMPREHENSIVE METABOLIC PANEL
ALT: 36 IU/L (ref 0–44)
AST: 22 IU/L (ref 0–40)
Albumin/Globulin Ratio: 2.2 (ref 1.2–2.2)
Albumin: 4.9 g/dL (ref 4.1–5.1)
Alkaline Phosphatase: 80 IU/L (ref 44–121)
BUN/Creatinine Ratio: 15 (ref 9–20)
BUN: 14 mg/dL (ref 6–24)
Bilirubin Total: 0.8 mg/dL (ref 0.0–1.2)
CO2: 24 mmol/L (ref 20–29)
Calcium: 9.8 mg/dL (ref 8.7–10.2)
Chloride: 100 mmol/L (ref 96–106)
Creatinine, Ser: 0.92 mg/dL (ref 0.76–1.27)
Globulin, Total: 2.2 g/dL (ref 1.5–4.5)
Glucose: 93 mg/dL (ref 70–99)
Potassium: 4.3 mmol/L (ref 3.5–5.2)
Sodium: 141 mmol/L (ref 134–144)
Total Protein: 7.1 g/dL (ref 6.0–8.5)
eGFR: 105 mL/min/{1.73_m2} (ref >59)

## 2021-09-29 LAB — VITAMIN B12: Vitamin B-12: 1929 pg/mL — ABNORMAL HIGH (ref 232–1245)

## 2021-09-29 LAB — MAGNESIUM: Magnesium: 2.2 mg/dL (ref 1.6–2.3)

## 2021-09-29 NOTE — Telephone Encounter (Signed)
Called and spoke to pt of lab results. Pt verbalized understanding and had no further questions at this time.

## 2021-09-29 NOTE — Progress Notes (Signed)
The Vit B 12 level is high, there is no need to supplement daily, magnesium is in normal range, no need to supplement Metabolic panel is normal, including liver enzymes.

## 2021-09-29 NOTE — Telephone Encounter (Signed)
-----   Message from Larey Seat, MD sent at 09/29/2021 12:46 PM EDT ----- The Vit B 12 level is high, there is no need to supplement daily, magnesium is in normal range, no need to supplement Metabolic panel is normal, including liver enzymes.

## 2021-10-19 ENCOUNTER — Ambulatory Visit
Admission: RE | Admit: 2021-10-19 | Discharge: 2021-10-19 | Disposition: A | Discharge status: Home / Self Care | Payer: No Typology Code available for payment source | Source: Ambulatory Visit | Attending: Neurology | Admitting: Neurology

## 2021-10-19 DIAGNOSIS — R29818 Other symptoms and signs involving the nervous system: Secondary | ICD-10-CM

## 2021-10-19 MED ORDER — GADOBENATE DIMEGLUMINE 529 MG/ML IV SOLN
20.0000 mL | Freq: Once | INTRAVENOUS | Status: AC | PRN
  Administered 2021-10-19: 20 mL via INTRAVENOUS

## 2021-10-21 ENCOUNTER — Encounter: Payer: Self-pay | Admitting: Neurology

## 2021-10-26 ENCOUNTER — Other Ambulatory Visit: Payer: Self-pay | Admitting: Internal Medicine

## 2021-10-26 DIAGNOSIS — R931 Abnormal findings on diagnostic imaging of heart and coronary circulation: Secondary | ICD-10-CM

## 2021-10-26 DIAGNOSIS — E7800 Pure hypercholesterolemia, unspecified: Secondary | ICD-10-CM

## 2021-10-26 NOTE — Telephone Encounter (Signed)
Called and spoke w/ pt about MRI results per Dr. Edwena Felty note. Pt verbalized understanding.

## 2021-10-28 ENCOUNTER — Ambulatory Visit: Payer: No Typology Code available for payment source | Admitting: Student

## 2021-10-28 ENCOUNTER — Ambulatory Visit: Payer: No Typology Code available for payment source | Admitting: Cardiology

## 2021-10-29 ENCOUNTER — Other Ambulatory Visit: Payer: Self-pay | Admitting: Internal Medicine

## 2021-11-15 ENCOUNTER — Ambulatory Visit: Payer: No Typology Code available for payment source | Admitting: Cardiology

## 2021-11-18 NOTE — Progress Notes (Signed)
[facility] [address] [place]. [place] [address] [phone]       OFFICE FOLLOW UP NOTE  Mr. Paul Freeman Date of Birth:  04/28/1975 Medical Record Number:  [id number]    Primary neurologist: Dr. Vickey Huger Reason for visit: Left-sided paresthesias    SUBJECTIVE:   CHIEF COMPLAINT:  Chief Complaint  Patient presents with   Hemisensory deficit    Rm 3, alone "no new concerns"    HPI:    Update 11/22/2021 JM: Patient returns for 40-month follow-up regarding left-sided paresthesias. Reports symptoms still persistent, still more consistent and left-sided facial and arm, occasionally in leg. Denies any pain or loss of feeling. Denies any weakness.  Seems to be worse with sitting. Can flucuate where some days he may  not notice symptoms but unsure if this is because he is becoming used to them. Does have headaches occasionally but does not seem to correlate with numbness symptoms.  Headache usually on left side of head, can have phonophobia, no benefit from OTC pain relievers.  Denies any hearing loss or tinnitus.  Denies any worsening since prior visit  MRI brain which did not show any acute findings or findings to explain the hemisensory loss. Did show T2/FLAIR hyperintense foci predominantly in white matter of frontal lobe, nonspecific finding and could be due to mild chronic microvascular hemic changes or sequela migraine headache, demyelination is unlikely to have this appearance  MRI cervical remail interbody fusion at C6-C7 unchanged since 2022 imaging, mild spinal stenosis throughout but no evidence of nerve root compression  Lab work 09/2021: B12 1929, mag 2.2, CMP WNL  Lab work 07/2021: TSH 0.52, B12 923, folate 9.3    History provided for reference purposes only Consult visit 09/28/2021 Dr. Vickey Huger: 09-28-2021: CD : New referral form Dr Lenord Fellers this time:  as new patient after more than 3 year hiatus, this time he is referred for L sided  paresthesia. Pt has had issues with his disc before causing pain and numbness. This time pt is only experiencing numbness. His Brother was dx with MS and follows Dr. Epimenio Foot.  He had in 2020 a right sided and very painful dyseasthesias, radiculopathic-  , he saw dr Danielle Dess and underwent discectomy, replacement by synthetic disc , Cervical C 5-6. Symptoms resolved.  Now he presents with left facial numbness involving full thickness of the cheek and gums, and upper lip. He reports a near syncope when he laughs very deeply, and that started 6 months ago. A tingling sensation as if soda tingles under the skin, rising to the forehead and crown.  He will let it pass , within seconds, has never fainted or fallen.  So far, this new sensation has not been worked up by imaging.   Cardiology note: DAHIR BARBATI  is a 46 y.o. Caucasian male with hypertension, extreme anxiety, mild  G6PD deficiency, who has responded well to blood pressure control with lisinopril/HCT, HCT utilized in spite of G6PD deficiency at a low dose which he is tolerating for years now. BP without lisinopril/HCT was uncontrolled.   Due to  atypical chest pain and elevated coronary calcium score that was done in 2018, he underwent coronary CT angiogram on 12/07/2018 revealing calcium score of 83 in the 94th percentile, proximal LAD less than 50% stenosis not hemodynamically significant.  He was therefore started on statin and beta-blocker therapy.  Due to episodes of dyspnea on exertion and fatigue patient recently underwent repeat coronary CTA which revealed no significant coronary  artery stenosis, total calcium score 150.     ROS:   14 system review of systems performed and negative with exception of those listed in HPI  PMH:  Past Medical History:  Diagnosis Date   Anal fissure    Anxiety    Depression    G6P deficiency (glucose-6-phosphatase deficiency) (HCC)    Hx of adenomatous polyp of colon 09/13/2021   Hypertension    Mild  hyperlipidemia    Pneumonia     PSH:  Past Surgical History:  Procedure Laterality Date   APPENDECTOMY     CYSTECTOMY  2008   cyst removal from left armpit    Social History:  Social History   Socioeconomic History   Marital status: Married    Spouse name: Geologist, engineering   Number of children: 2   Years of education: Not on file   Highest education level: Associate degree: academic program  Occupational History   Not on file  Tobacco Use   Smoking status: Former    Packs/day: 0.50    Years: 24.00    Total pack years: 12.00    Types: Cigarettes    Quit date: 2013    Years since quitting: 10.7   Smokeless tobacco: Never  Vaping Use   Vaping Use: Never used  Substance and Sexual Activity   Alcohol use: Yes    Comment: occasional   Drug use: No   Sexual activity: Not Currently  Other Topics Concern   Not on file  Social History Narrative   Married, wife works for [employer] doing medical review work    2 children   R handed   Caffeine: 4 espresso shots per day      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[place], [employer]   Occasional alcohol former smoker no tobacco or drugs   Social Determinants of Corporate investment banker Strain: Not on file  Food Insecurity: Not on file  Transportation Needs: Not on file  Physical Activity: Not on file  Stress: Not on file  Social Connections: Not on file  Intimate Partner Violence: Not on file    Family History:  Family History  Problem Relation Age of Onset   Breast cancer Mother    Arthritis Mother    Hypertension Father    Atrial fibrillation Father    Prostate cancer Father    Multiple sclerosis Brother    Colon cancer Neg Hx    Stomach cancer Neg Hx    Pancreatic cancer Neg Hx    Esophageal cancer Neg Hx    Liver disease Neg Hx     Medications:   Current Outpatient Medications on File Prior to Visit  Medication Sig Dispense Refill   AMBULATORY NON FORMULARY MEDICATION Glutathime  injection once week     AMBULATORY NON FORMULARY MEDICATION Immunity injection once a week     AMBULATORY NON FORMULARY MEDICATION Biotin injection once a week     AMBULATORY NON FORMULARY MEDICATION Vitamin D injection once a week     amLODipine (NORVASC) 10 MG tablet Take 1 tablet (10 mg total) by mouth daily. 90 tablet 3   anastrozole (ARIMIDEX) 1 MG tablet Take 1 mg by mouth. Takes twice a week     Coenzyme Q10 (COQ10 PO) Take by mouth daily.     cyanocobalamin (,VITAMIN B-12,) 1000 MCG/ML injection Inject 1,000 mcg into the muscle once a week.     diazepam (VALIUM) 10 MG tablet For anxiety (Patient taking differently: as needed for  anxiety. For anxiety) 60 tablet 1   furosemide (LASIX) 40 MG tablet Take 40 mg by mouth. Takes weekly     Lactobacillus Rhamnosus, GG, (CULTURELLE IMMUNITY SUPPORT PO) Take 1 tablet by mouth once a week.     lisinopril-hydrochlorothiazide (ZESTORETIC) 20-12.5 MG tablet Take 1 tablet by mouth every morning. 90 tablet 3   metoprolol tartrate (LOPRESSOR) 100 MG tablet Take 1 tablet (100 mg total) by mouth 2 (two) times daily. 180 tablet 3   risperiDONE microspheres (RISPERDAL CONSTA) 25 MG injection Inject 25 mg into the muscle once a week.     testosterone enanthate (DELATESTRYL) 200 MG/ML injection Inject into the muscle once a week. For IM use only     venlafaxine XR (EFFEXOR-XR) 150 MG 24 hr capsule TAKE ONE CAPSULE BY MOUTH DAILY WITH BREAKFAST 90 capsule 0   rosuvastatin (CRESTOR) 10 MG tablet TAKE 1 TABLET BY MOUTH EVERY DAY (Patient not taking: Reported on 11/22/2021) 90 tablet 3   No current facility-administered medications on file prior to visit.    Allergies:   Allergies  Allergen Reactions   Penicillins Other (See Comments)    UNKNOWN/childhood       OBJECTIVE:  Physical Exam  Vitals:   11/22/21 1355  BP: 125/83  Pulse: 84  Weight: 273 lb (123.8 kg)  Height: 6\' 2"  (1.88 m)   Body mass index is 35.05 kg/m. No results  found.   General: well developed, well nourished, very pleasant middle-age Caucasian male, seated, in no evident distress Head: head normocephalic and atraumatic.   Neck: supple with no carotid or supraclavicular bruits Cardiovascular: regular rate and rhythm, no murmurs Musculoskeletal: no deformity Skin:  no rash/petichiae Vascular:  Normal pulses all extremities   Neurologic Exam Mental Status: Awake and fully alert. Oriented to place and time. Recent and remote memory intact. Attention span, concentration and fund of knowledge appropriate. Mood and affect appropriate.  Cranial Nerves: Pupils equal, briskly reactive to light. Extraocular movements full without nystagmus. Visual fields full to confrontation. Hearing intact.  Slight decreased light touch left lower facial sensation. Face, tongue, palate moves normally and symmetrically.  Motor: Normal bulk and tone. Normal strength in all tested extremity muscles Sensory.: intact to touch , pinprick , position and vibratory sensation except numbness sensation with light touch on LUE and LLE Coordination: Rapid alternating movements normal in all extremities. Finger-to-nose and heel-to-shin performed accurately bilaterally. Gait and Station: Arises from chair without difficulty. Stance is normal. Gait demonstrates normal stride length and balance without use of AD. Reflexes: 1+ and symmetric. Toes downgoing.         ASSESSMENT/PLAN: KORNELIUS SEAWELL is a 46 y.o. year old male    Left-sided facial paresthesias  -Unknown etiology  -MRI brain and cervical imaging no findings to explain sensory loss  -Lab work for reversible causes unremarkable  -Recommend pursuing EMG/NCV  Occasional headaches  -Possibly migrainous type -Does not seem to correlate with above symptoms  -Declines interest in medication management at this time    Follow-up will be determined after EMG/NCV    CC:  PCP: Margaree Mackintosh, MD    I spent 26  minutes of face-to-face and non-face-to-face time with patient.  This included previsit chart review, lab review, study review, order entry, electronic health record documentation, patient education regarding above diagnoses and treatment plan and answered all the questions to patient's satisfaction   Ihor Austin, [facility]  [facility] [address] [place], [place] [address]  Phone  [phone] Fax [phone] Note: This document was prepared with digital dictation and possible smart phrase technology. Any transcriptional errors that result from this process are unintentional.

## 2021-11-22 ENCOUNTER — Encounter: Payer: Self-pay | Admitting: Adult Health

## 2021-11-22 ENCOUNTER — Ambulatory Visit: Payer: No Typology Code available for payment source | Admitting: Adult Health

## 2021-11-22 VITALS — BP 125/83 | HR 84 | Ht 74.0 in | Wt 273.0 lb

## 2021-11-22 DIAGNOSIS — R29818 Other symptoms and signs involving the nervous system: Secondary | ICD-10-CM

## 2021-11-22 NOTE — Patient Instructions (Addendum)
Will recommend proceeding with EMG/NCV   Please let me know if your headaches should worsen or become more frequent     Follow up will be determined after completion of testing     Electromyoneurogram Electromyoneurogram is a test to check how well your muscles and nerves are working. This procedure includes the combined use of electromyogram (EMG) and nerve conduction study (NCS). EMG is used to evaluate muscles and the nerves that control those muscles. NCS, which is also called electroneurogram, measures how well your nerves conduct electricity. The procedures should be done together to check if your muscles and nerves are healthy. If the results of the tests are abnormal, this may indicate disease or injury, such as a neuromuscular disease or peripheral nerve damage. Tell a health care provider about: Any allergies you have. All medicines you are taking, including vitamins, herbs, eye drops, creams, and over-the-counter medicines. Any bleeding problems you have. Any surgeries you have had. Any medical conditions you have. What are the risks? Generally, this is a safe procedure. However, problems may occur, including: Bleeding or bruising. Infection where the electrodes were inserted. What happens before the test? Medicines Take all of your usually prescribed medications before this testing is performed. Do not stop your blood thinners unless advised by your prescribing physician. General instructions Your health care provider may ask you to warm the limb that will be checked with warm water, hot pack, or wrapping the limb in a blanket. Do not use lotions or creams on the same day that you will be having the procedure. What happens during the test? For EMG  Your health care provider will ask you to stay in a position so that the muscle being studied can be accessed. You will be sitting or lying down. You may be given a medicine to numb the area (local anesthetic) and the skin  will be disinfected. A very thin needle that has an electrode will be inserted into your muscle, one muscle at a time. Typically, multiple muscles are evaluated during a single study. Another small electrode will be placed on your skin near the muscle. Your health care provider will ask you to continue to remain still. The electrodes will record the electrical activity of your muscles. You may see this on a monitor or hear it in the room. After your muscles have been studied at rest, your health care provider will ask you to contract or flex your muscles. The electrodes will record the electrical activity of your muscles. Your health care provider will remove the electrodes and the electrode needle when the procedure is finished. The procedure may vary among health care providers and hospitals. For NCS  An electrode that records your nerve activity (recording electrode) will be placed on your skin by the muscle that is being studied. An electrode that is used as a reference (reference electrode) will be placed near the recording electrode. A paste or gel will be applied to your skin between the recording electrode and the reference electrode. Your nerve will be stimulated with a mild shock. The speed of the nerves and strength of response is recorded by the electrodes. Your health care provider will remove the electrodes and the gel when the procedure is finished. The procedure may vary among health care providers and hospitals. What can I expect after the test? It is up to you to get your test results. Ask your health care provider, or the department that is doing the test, when your results will  be ready. Your health care provider may: Give you medicines for any pain. Monitor the insertion sites to make sure that bleeding stops. You should be able to drive yourself to and from the test. Discomfort can persist for a few hours after the test, but should be better the next day. Contact a health  care provider if: You have swelling, redness, or drainage at any of the insertion sites. Summary Electromyoneurogram is a test to check how well your muscles and nerves are working. If the results of the tests are abnormal, this may indicate disease or injury. This is a safe procedure. However, problems may occur, such as bleeding and infection. Your health care provider will do two tests to complete this procedure. One checks your muscles (EMG) and another checks your nerves (NCS). It is up to you to get your test results. Ask your health care provider, or the department that is doing the test, when your results will be ready. This information is not intended to replace advice given to you by your health care provider. Make sure you discuss any questions you have with your health care provider. Document Revised: 10/07/2020 Document Reviewed: 09/06/2020 Elsevier Patient Education  [place].

## 2021-11-23 ENCOUNTER — Telehealth: Payer: Self-pay | Admitting: Adult Health

## 2021-11-23 NOTE — Telephone Encounter (Signed)
NCS/EMG order faxed to [facility], phone # [phone].

## 2022-01-28 ENCOUNTER — Other Ambulatory Visit: Payer: Self-pay | Admitting: Internal Medicine

## 2022-02-23 ENCOUNTER — Encounter: Payer: Self-pay | Admitting: Internal Medicine

## 2022-02-23 ENCOUNTER — Other Ambulatory Visit: Payer: Self-pay | Admitting: Internal Medicine

## 2022-02-23 ENCOUNTER — Telehealth: Payer: Self-pay | Admitting: Internal Medicine

## 2022-02-23 DIAGNOSIS — F411 Generalized anxiety disorder: Secondary | ICD-10-CM

## 2022-02-23 NOTE — Telephone Encounter (Signed)
Received request from [facility] for refill on Valium. Have sent this request in to pharmacy #[id number] with one refill to  take twice daily as needed. Patient not seen recently and needs to book CPE in near future. MJB, MD

## 2022-04-30 ENCOUNTER — Other Ambulatory Visit: Payer: Self-pay | Admitting: Internal Medicine

## 2022-05-04 ENCOUNTER — Other Ambulatory Visit: Payer: Self-pay | Admitting: Internal Medicine

## 2022-05-11 ENCOUNTER — Telehealth: Payer: Self-pay | Admitting: Internal Medicine

## 2022-05-11 NOTE — Telephone Encounter (Signed)
Called and ask patient to call back to schedule an appointment.

## 2022-05-11 NOTE — Telephone Encounter (Signed)
Received refill request for  generic Effexor. Patient has not been seen in sometime. I called him and let him know we would need to book an appt. I have asked him to call Wanda to book appt for refill only in next 2 weeks. I have sent in 30 days of requested med. MJB, MD 

## 2022-05-16 ENCOUNTER — Encounter: Payer: Self-pay | Admitting: Family Medicine

## 2022-05-16 ENCOUNTER — Ambulatory Visit: Payer: 59 | Admitting: Family Medicine

## 2022-05-16 VITALS — BP 141/88 | HR 77 | Temp 98.2°F | Ht 74.0 in | Wt 265.1 lb

## 2022-05-16 DIAGNOSIS — E538 Deficiency of other specified B group vitamins: Secondary | ICD-10-CM

## 2022-05-16 DIAGNOSIS — E789 Disorder of lipoprotein metabolism, unspecified: Secondary | ICD-10-CM

## 2022-05-16 DIAGNOSIS — Z1329 Encounter for screening for other suspected endocrine disorder: Secondary | ICD-10-CM

## 2022-05-16 DIAGNOSIS — E559 Vitamin D deficiency, unspecified: Secondary | ICD-10-CM

## 2022-05-16 DIAGNOSIS — F411 Generalized anxiety disorder: Secondary | ICD-10-CM

## 2022-05-16 DIAGNOSIS — Z125 Encounter for screening for malignant neoplasm of prostate: Secondary | ICD-10-CM | POA: Diagnosis not present

## 2022-05-16 DIAGNOSIS — I10 Essential (primary) hypertension: Secondary | ICD-10-CM

## 2022-05-16 MED ORDER — ESCITALOPRAM OXALATE 10 MG PO TABS: 10.0000 mg | ORAL_TABLET | Freq: Every day | ORAL | 3 refills | Status: DC

## 2022-05-16 NOTE — Assessment & Plan Note (Signed)
-   pt on valium and effexor for anxiety have discussed getting off valium and effexor and switching to lexapro daily. Pt is agreeable

## 2022-05-16 NOTE — Assessment & Plan Note (Signed)
-   continue medication per cards recs, will order cmp and cbc for monitoring

## 2022-05-16 NOTE — Progress Notes (Signed)
Established patient visit   Patient: Paul Freeman   DOB: 06/24/1975   46 y.o. Male  MRN: [id number] Visit Date: 05/16/2022  Today's healthcare provider: Charlton Amor, DO   Chief Complaint  Patient presents with   Establish Care   Annual Exam    Pt is not fasting. No concerns today.     SUBJECTIVE    Chief Complaint  Patient presents with   Establish Care   Annual Exam    Pt is not fasting. No concerns today.    HPI HPI     Annual Exam    Additional comments: Pt is not fasting. No concerns today.       Last edited by Yolanda Manges, CMA on 05/16/2022  1:10 PM.      Has G6PD   Diet: varied diet Exercise: needs improvement  Family hx: HTN: yes-dad and grandfather DM: no Cancers: dad-prostate cancer late 35s; uncle 7s, mom-breast cancer  PSA: strong family history Colonoscopy: normal   Dentist 6 months ago  Eye dr: 1 year and a half    Brother has MS   Review of Systems  Constitutional:  Negative for activity change, fatigue and fever.  Respiratory:  Negative for cough and shortness of breath.   Cardiovascular:  Negative for chest pain.  Gastrointestinal:  Negative for abdominal pain.  Genitourinary:  Negative for difficulty urinating.       Current Meds  Medication Sig   AMBULATORY NON FORMULARY MEDICATION Glutathime injection once week   AMBULATORY NON FORMULARY MEDICATION Immunity injection once a week   AMBULATORY NON FORMULARY MEDICATION Biotin injection once a week   AMBULATORY NON FORMULARY MEDICATION Vitamin D injection once a week   amLODipine (NORVASC) 10 MG tablet Take 1 tablet (10 mg total) by mouth daily.   anastrozole (ARIMIDEX) 1 MG tablet Take 1 mg by mouth. Takes twice a week   Coenzyme Q10 (COQ10 PO) Take by mouth daily.   cyanocobalamin (,VITAMIN B-12,) 1000 MCG/ML injection Inject 1,000 mcg into the muscle once a week.   escitalopram (LEXAPRO) 10 MG tablet Take 1 tablet (10 mg total) by mouth daily.   fluticasone  (FLONASE) 50 MCG/ACT nasal spray Place 1 spray into the nose daily.   lisinopril-hydrochlorothiazide (ZESTORETIC) 20-12.5 MG tablet Take 1 tablet by mouth every morning.   metoprolol tartrate (LOPRESSOR) 100 MG tablet Take 1 tablet (100 mg total) by mouth 2 (two) times daily.   risperiDONE microspheres (RISPERDAL CONSTA) 25 MG injection Inject 25 mg into the muscle once a week.   testosterone enanthate (DELATESTRYL) 200 MG/ML injection Inject into the muscle once a week. For IM use only   triamcinolone (KENALOG) 0.025 % cream Apply 1 Application topically daily.   [DISCONTINUED] diazepam (VALIUM) 10 MG tablet 1 Tablet twice daily as needed for anxiety   [DISCONTINUED] venlafaxine XR (EFFEXOR-XR) 150 MG 24 hr capsule TAKE ONE CAPSULE BY MOUTH DAILY WITH BREAKFAST    OBJECTIVE    BP (!) 141/88   Pulse 77   Temp 98.2 F (36.8 C) (Oral)   Ht 6\' 2"  (1.88 m)   Wt 265 lb 1.3 oz (120.2 kg)   SpO2 97%   BMI 34.03 kg/m   Physical Exam Vitals and nursing note reviewed.  Constitutional:      General: He is not in acute distress.    Appearance: Normal appearance.  HENT:     Head: Normocephalic and atraumatic.     Right Ear: External ear normal.  Left Ear: External ear normal.     Nose: Nose normal.  Eyes:     Conjunctiva/sclera: Conjunctivae normal.  Cardiovascular:     Rate and Rhythm: Normal rate and regular rhythm.  Pulmonary:     Effort: Pulmonary effort is normal.     Breath sounds: Normal breath sounds.  Neurological:     General: No focal deficit present.     Mental Status: He is alert and oriented to person, place, and time.  Psychiatric:        Mood and Affect: Mood normal.        Behavior: Behavior normal.        Thought Content: Thought content normal.        Judgment: Judgment normal.          ASSESSMENT & PLAN    Problem List Items Addressed This Visit       Cardiovascular and Mediastinum   Hypertension    - continue medication per cards recs, will order  cmp and cbc for monitoring      Relevant Orders   COMPLETE METABOLIC PANEL WITH GFR   CBC     Other   Generalized anxiety disorder - Primary    - pt on valium and effexor for anxiety have discussed getting off valium and effexor and switching to lexapro daily. Pt is agreeable      Relevant Medications   escitalopram (LEXAPRO) 10 MG tablet   Other Visit Diagnoses     Screening for prostate cancer       Relevant Orders   PSA, total and free   Lipid disorder       Relevant Orders   Lipid panel   Screening for thyroid disorder       Relevant Orders   TSH + free T4   Vitamin D deficiency       Relevant Orders   Vitamin D (25 hydroxy)   Vitamin B12 deficiency       Relevant Orders   Vitamin B12       No follow-ups on file.      Meds ordered this encounter  Medications   escitalopram (LEXAPRO) 10 MG tablet    Sig: Take 1 tablet (10 mg total) by mouth daily.    Dispense:  30 tablet    Refill:  3    Orders Placed This Encounter  Procedures   Lipid panel    Order Specific Question:   Has the patient fasted?    Answer:   No    Order Specific Question:   Release to patient    Answer:   Immediate   COMPLETE METABOLIC PANEL WITH GFR   CBC   PSA, total and free   TSH + free T4   Vitamin D (25 hydroxy)   Vitamin B12     Charlton Amor, DO  [facility] at [facility] [phone] (phone) [phone] (fax)  [facility]

## 2022-05-16 NOTE — Patient Instructions (Signed)
Stop Effexor and valium  Start playing Lexapro   Follow up in 4 weeks for lexapro follow up

## 2022-05-18 LAB — COMPLETE METABOLIC PANEL WITH GFR
AG Ratio: 2.2 (calc) (ref 1.0–2.5)
ALT: 31 U/L (ref 9–46)
AST: 22 U/L (ref 10–40)
Albumin: 4.9 g/dL (ref 3.6–5.1)
Alkaline phosphatase (APISO): 69 U/L (ref 36–130)
BUN: 10 mg/dL (ref 7–25)
CO2: 31 mmol/L (ref 20–32)
Calcium: 9.8 mg/dL (ref 8.6–10.3)
Chloride: 100 mmol/L (ref 98–110)
Creat: 0.97 mg/dL (ref 0.60–1.29)
Globulin: 2.2 g/dL (calc) (ref 1.9–3.7)
Glucose, Bld: 83 mg/dL (ref 65–99)
Potassium: 4.1 mmol/L (ref 3.5–5.3)
Sodium: 140 mmol/L (ref 135–146)
Total Bilirubin: 1.2 mg/dL (ref 0.2–1.2)
Total Protein: 7.1 g/dL (ref 6.1–8.1)
eGFR: 98 mL/min/{1.73_m2} (ref >=60)

## 2022-05-18 LAB — CBC
HCT: 52.1 % — ABNORMAL HIGH (ref 38.5–50.0)
Hemoglobin: 17.5 g/dL — ABNORMAL HIGH (ref 13.2–17.1)
MCH: 29.4 pg (ref 27.0–33.0)
MCHC: 33.6 g/dL (ref 32.0–36.0)
MCV: 87.6 fL (ref 80.0–100.0)
MPV: 9.9 fL (ref 7.5–12.5)
Platelets: 221 10*3/uL (ref 140–400)
RBC: 5.95 10*6/uL — ABNORMAL HIGH (ref 4.20–5.80)
RDW: 12.6 % (ref 11.0–15.0)
WBC: 7.5 10*3/uL (ref 3.8–10.8)

## 2022-05-18 LAB — VITAMIN B12: Vitamin B-12: >2000 pg/mL — ABNORMAL HIGH (ref 200–1100)

## 2022-05-18 LAB — LIPID PANEL
Cholesterol: 173 mg/dL (ref <200)
HDL: 43 mg/dL (ref >=40)
LDL Cholesterol (Calc): 110 mg/dL (calc) — ABNORMAL HIGH
Non-HDL Cholesterol (Calc): 130 mg/dL (calc) — ABNORMAL HIGH (ref <130)
Total CHOL/HDL Ratio: 4 (calc) (ref <5.0)
Triglycerides: 101 mg/dL (ref <150)

## 2022-05-18 LAB — PSA, TOTAL AND FREE
PSA, % Free: 29 % (calc) (ref >25)
PSA, Free: 0.2 ng/mL
PSA, Total: 0.7 ng/mL (ref <=4.0)

## 2022-05-18 LAB — VITAMIN D 25 HYDROXY (VIT D DEFICIENCY, FRACTURES): Vit D, 25-Hydroxy: 75 ng/mL (ref 30–100)

## 2022-05-18 LAB — TSH+FREE T4: TSH W/REFLEX TO FT4: 0.74 mIU/L (ref 0.40–4.50)

## 2022-06-13 ENCOUNTER — Other Ambulatory Visit: Payer: Self-pay | Admitting: Internal Medicine

## 2022-06-13 DIAGNOSIS — F411 Generalized anxiety disorder: Secondary | ICD-10-CM

## 2022-07-04 ENCOUNTER — Encounter: Payer: Self-pay | Admitting: Cardiology

## 2022-07-05 NOTE — Telephone Encounter (Signed)
From patient.

## 2022-07-07 ENCOUNTER — Ambulatory Visit (INDEPENDENT_AMBULATORY_CARE_PROVIDER_SITE_OTHER): Payer: 59 | Admitting: Family Medicine

## 2022-07-07 DIAGNOSIS — Z91199 Patient's noncompliance with other medical treatment and regimen due to unspecified reason: Secondary | ICD-10-CM

## 2022-07-07 NOTE — Progress Notes (Signed)
No show

## 2022-07-12 ENCOUNTER — Ambulatory Visit: Payer: 59 | Admitting: Family Medicine

## 2022-07-12 ENCOUNTER — Encounter: Payer: Self-pay | Admitting: Family Medicine

## 2022-07-12 VITALS — BP 110/80 | HR 102 | Ht 74.0 in | Wt 265.0 lb

## 2022-07-12 DIAGNOSIS — M7989 Other specified soft tissue disorders: Secondary | ICD-10-CM | POA: Diagnosis not present

## 2022-07-12 DIAGNOSIS — F411 Generalized anxiety disorder: Secondary | ICD-10-CM | POA: Diagnosis not present

## 2022-07-12 MED ORDER — VORTIOXETINE HBR 5 MG PO TABS: 5.0000 mg | ORAL_TABLET | Freq: Every day | ORAL | 3 refills | Status: DC

## 2022-07-12 MED ORDER — BUSPIRONE HCL 15 MG PO TABS: 15.0000 mg | ORAL_TABLET | Freq: Two times a day (BID) | ORAL | 1 refills | Status: DC

## 2022-07-12 NOTE — Telephone Encounter (Signed)
From patient.

## 2022-07-12 NOTE — Assessment & Plan Note (Signed)
-   pt has hx of GAD and stopped lexapro, will go ahead and try trintellix. Pt has been on numerous ssris and antipsychotics that have not helped - also discussed need for buspar and prescribed some for patient to take when he is having a panic attack - discussed therapy and pt is open and willing would like him to work on gaining tools to help decrease panic attacks coming on

## 2022-07-12 NOTE — Progress Notes (Signed)
Established patient visit   Patient: Paul Freeman   DOB: [birth year]   47 y.o. Male  MRN: [id number] Visit Date: 07/12/2022  Today's healthcare provider: Charlton Amor, DO   Chief Complaint  Patient presents with   possible hernia    C/o swelling  and occasional discomfort - top of groin - center.    Left flank "knott"     Patient c/o  "knott" left flank - occasionally painful during stressful situations.    elevated pulse    Patient c/o elevated pulse x 1 mth - will see Dr. Charleston Poot -cardiologist tomorrow.     SUBJECTIVE    Chief Complaint  Patient presents with   possible hernia    C/o swelling  and occasional discomfort - top of groin - center.    Left flank "knott"     Patient c/o  "knott" left flank - occasionally painful during stressful situations.    elevated pulse    Patient c/o elevated pulse x 1 mth - will see Dr. Charleston Poot -cardiologist tomorrow.    HPI  Pt presents for concern of a "bulge" in the groin area. He localizes area to below the umbilical region in the pelvic area. He denies any recent heavy lifting. He says he fells a discomfort which is making him concerned.   Lexapro follow up - pt started on lexapro 10mg  daily at last visit in April. He was previously on effexor and valium and was not noting improvement of symptoms.  - he stopped the lexapro two months ago due to decreased libido - is still reporting anxiety symptoms and would like to try something else.   Review of Systems  Constitutional:  Negative for activity change, fatigue and fever.  Respiratory:  Negative for cough and shortness of breath.   Cardiovascular:  Negative for chest pain.  Gastrointestinal:  Negative for abdominal pain.  Genitourinary:  Negative for difficulty urinating.  Psychiatric/Behavioral:  The patient is nervous/anxious.        Current Meds  Medication Sig   amLODipine (NORVASC) 10 MG tablet Take 1 tablet (10 mg total) by mouth daily.   anastrozole (ARIMIDEX)  1 MG tablet Take 1 mg by mouth. Takes twice a week   busPIRone (BUSPAR) 15 MG tablet Take 1 tablet (15 mg total) by mouth 2 (two) times daily.   Coenzyme Q10 (COQ10 PO) Take by mouth daily.   fluticasone (FLONASE) 50 MCG/ACT nasal spray Place 1 spray into the nose daily.   lisinopril-hydrochlorothiazide (ZESTORETIC) 20-12.5 MG tablet Take 1 tablet by mouth every morning.   metoprolol tartrate (LOPRESSOR) 100 MG tablet Take 1 tablet (100 mg total) by mouth 2 (two) times daily.   testosterone enanthate (DELATESTRYL) 200 MG/ML injection Inject into the muscle once a week. For IM use only   triamcinolone (KENALOG) 0.025 % cream Apply 1 Application topically daily.   vortioxetine HBr (TRINTELLIX) 5 MG TABS tablet Take 1 tablet (5 mg total) by mouth daily.    OBJECTIVE    BP 110/80   Pulse (!) 102   Ht 6\' 2"  (1.88 m)   Wt 265 lb (120.2 kg)   SpO2 97%   BMI 34.02 kg/m   Physical Exam Vitals and nursing note reviewed.  Constitutional:      General: He is not in acute distress.    Appearance: Normal appearance.  HENT:     Head: Normocephalic and atraumatic.     Right Ear: External ear normal.  Left Ear: External ear normal.     Nose: Nose normal.  Eyes:     Conjunctiva/sclera: Conjunctivae normal.  Cardiovascular:     Rate and Rhythm: Normal rate and regular rhythm.  Pulmonary:     Effort: Pulmonary effort is normal.     Breath sounds: Normal breath sounds.  Genitourinary:    Comments: Chaperone present while conducting hernia exam. No bulge felt even when patient coughed. Pt notes discomfort to tenderness to palpation of pubic area Musculoskeletal:     Comments: Small dime sized mass noted in left flank area that is soft and mobile. Non tender to palpation   Neurological:     General: No focal deficit present.     Mental Status: He is alert and oriented to person, place, and time.  Psychiatric:        Mood and Affect: Mood normal.        Behavior: Behavior normal.         Thought Content: Thought content normal.        Judgment: Judgment normal.        ASSESSMENT & PLAN    Problem List Items Addressed This Visit       Other   GAD (generalized anxiety disorder)    - pt has hx of GAD and stopped lexapro, will go ahead and try trintellix. Pt has been on numerous ssris and antipsychotics that have not helped - also discussed need for buspar and prescribed some for patient to take when he is having a panic attack - discussed therapy and pt is open and willing would like him to work on gaining tools to help decrease panic attacks coming on       Relevant Medications   vortioxetine HBr (TRINTELLIX) 5 MG TABS tablet   busPIRone (BUSPAR) 15 MG tablet   Other Relevant Orders   Ambulatory referral to Psychology   Soft tissue mass - Primary    - mass noted in pubic region with discomfort on palpation - no hernia noted in inguinal canal  - will order ultrasound of pelvis to further investigate  - mass in flank area that is mobile-- will go ahead and order a soft tissue ultrasound to further evaluate. Wondering if this is a small lipoma       Relevant Orders   US PELVIS (TRANSABDOMINAL ONLY)   US Abdomen Complete    Return in about 4 weeks (around 08/09/2022) for GAD.      Meds ordered this encounter  Medications   vortioxetine HBr (TRINTELLIX) 5 MG TABS tablet    Sig: Take 1 tablet (5 mg total) by mouth daily.    Dispense:  30 tablet    Refill:  3   busPIRone (BUSPAR) 15 MG tablet    Sig: Take 1 tablet (15 mg total) by mouth 2 (two) times daily.    Dispense:  30 tablet    Refill:  1    Orders Placed This Encounter  Procedures   US PELVIS (TRANSABDOMINAL ONLY)    Standing Status:   Future    Standing Expiration Date:   07/12/2023    Order Specific Question:   Reason for exam:    Answer:   concern for hernia in pelvic area on male    Order Specific Question:   Preferred imaging location?    Answer:   [facility]   US Abdomen Complete     Standing Status:   Future    Standing Expiration Date:  07/12/2023    Order Specific Question:   Reason for Exam (SYMPTOM  OR DIAGNOSIS REQUIRED)    Answer:   pt has soft tissue node on left flank    Order Specific Question:   Preferred imaging location?    Answer:   [facility]   Ambulatory referral to Psychology    Referral Priority:   Routine    Referral Type:   Psychiatric    Referral Reason:   Specialty Services Required    Requested Specialty:   Psychology    Number of Visits Requested:   1     Charlton Amor, DO  [facility] at [facility] [place] [phone] (phone) [phone] (fax)  [facility]

## 2022-07-12 NOTE — Assessment & Plan Note (Addendum)
-   mass noted in pubic region with discomfort on palpation - no hernia noted in inguinal canal  - will order ultrasound of pelvis to further investigate  - mass in flank area that is mobile-- will go ahead and order a soft tissue ultrasound to further evaluate. Wondering if this is a small lipoma

## 2022-07-13 ENCOUNTER — Encounter: Payer: Self-pay | Admitting: Cardiology

## 2022-07-13 ENCOUNTER — Ambulatory Visit: Payer: 59 | Admitting: Cardiology

## 2022-07-13 VITALS — BP 120/81 | HR 88 | Resp 16 | Ht 74.0 in | Wt 264.0 lb

## 2022-07-13 DIAGNOSIS — M7989 Other specified soft tissue disorders: Secondary | ICD-10-CM

## 2022-07-13 DIAGNOSIS — F411 Generalized anxiety disorder: Secondary | ICD-10-CM

## 2022-07-13 DIAGNOSIS — I10 Essential (primary) hypertension: Secondary | ICD-10-CM

## 2022-07-13 DIAGNOSIS — R0609 Other forms of dyspnea: Secondary | ICD-10-CM

## 2022-07-13 DIAGNOSIS — R072 Precordial pain: Secondary | ICD-10-CM

## 2022-07-13 DIAGNOSIS — R931 Abnormal findings on diagnostic imaging of heart and coronary circulation: Secondary | ICD-10-CM

## 2022-07-13 DIAGNOSIS — E7800 Pure hypercholesterolemia, unspecified: Secondary | ICD-10-CM

## 2022-07-13 MED ORDER — ATORVASTATIN CALCIUM 10 MG PO TABS: 10.0000 mg | ORAL_TABLET | Freq: Every day | ORAL | 2 refills | Status: DC

## 2022-07-13 NOTE — Progress Notes (Unsigned)
Primary Physician/Referring:  Charlton Amor, DO  Patient ID: Paul Freeman, male    DOB: Apr 03, 1975, 47 y.o.   MRN: [id number]  Chief Complaint  Patient presents with  . Shortness of Breath  . Follow-up   HPI:    Paul Freeman  is a 47 y.o. Caucasian male with hypertension, extreme anxiety, mild  G6PD deficiency, who has responded well to blood pressure control with lisinopril/HCT, HCT utilized in spite of G6PD deficiency at a low dose which he is tolerating for years now. BP without lisinopril/HCT was uncontrolled.  Past medical history significant for coronary calcium score in the 97 percentile in 2023, no significant coronary disease by coronary CT angiogram, hypercholesterolemia, hypogonadism presently on testosterone supplement and generalized anxiety disorder.  Patient has called our office and made appointment to see me for shortness of breath, chest pain.  He denies any dizziness, syncope, neurologic deficits.  Past Medical History:  Diagnosis Date  . Allergy    Possible Penicillin, Pollen  . Anal fissure   . Anxiety   . Arthritis   . Depression   . G6P deficiency (glucose-6-phosphatase deficiency) (HCC)   . Hx of adenomatous polyp of colon 09/13/2021  . Hypertension   . Mild hyperlipidemia   . Pneumonia    Past Surgical History:  Procedure Laterality Date  . APPENDECTOMY    . CYSTECTOMY  2008   cyst removal from left armpit   Social History   Tobacco Use  . Smoking status: Former    Packs/day: 1.50    Years: 15.00    Additional pack years: 0.00    Total pack years: 22.50    Types: Cigarettes    Quit date: 02/08/2011    Years since quitting: 11.4  . Smokeless tobacco: Never  Substance Use Topics  . Alcohol use: Not Currently    Comment: Social once per quarter drinking.  Marital Status: Married    ROS  Review of Systems  Cardiovascular:  Positive for chest pain and dyspnea on exertion. Negative for leg swelling.  Psychiatric/Behavioral:  Positive  for depression. The patient is nervous/anxious.    Objective      07/13/2022   10:14 AM 07/12/2022    1:22 PM 05/16/2022   12:59 PM  Vitals with BMI  Height 6\' 2"  6\' 2"  6\' 2"   Weight 264 lbs 265 lbs 265 lbs 1 oz  BMI 33.88 34.01 34.02  Systolic 120 110 409  Diastolic 81 80 88  Pulse 88 102 77    Blood pressure 120/81, pulse 88, resp. rate 16, height 6\' 2"  (1.88 m), weight 264 lb (119.7 kg), SpO2 96 %. Body mass index is 33.9 kg/m.   Physical Exam Vitals reviewed.  Constitutional:      Appearance: He is obese.     Comments: He is well-built and moderately obese in no acute distress.  Neck:     Thyroid: No thyromegaly.     Vascular: No JVD.  Cardiovascular:     Rate and Rhythm: Normal rate and regular rhythm.     Pulses: Intact distal pulses.          Carotid pulses are 2+ on the right side and 2+ on the left side.      Radial pulses are 2+ on the right side and 2+ on the left side.       Femoral pulses are 2+ on the right side and 2+ on the left side.      Dorsalis pedis pulses  are 2+ on the right side and 2+ on the left side.       Posterior tibial pulses are 2+ on the right side and 2+ on the left side.     Heart sounds: Normal heart sounds. No murmur heard.    No gallop.  Pulmonary:     Effort: Pulmonary effort is normal.     Breath sounds: Normal breath sounds.  Abdominal:     General: Bowel sounds are normal.     Palpations: Abdomen is soft.  Musculoskeletal:     Right lower leg: No edema.     Left lower leg: No edema.  Physical exam unchanged compared to previous office visit.  Radiology: No results found.  Laboratory examination:   G6PD deficiency 01/25/18: G-6-PD, Quant 4.6 - 13.5 U/g Hb <.3Low    Recent Labs    09/28/21 1118 05/16/22 1349  NA 141 140  K 4.3 4.1  CL 100 100  CO2 24 31  GLUCOSE 93 83  BUN 14 10  CREATININE 0.92 0.97  CALCIUM 9.8 9.8      Latest Ref Rng & Units 05/16/2022    1:49 PM 09/28/2021   11:18 AM 07/09/2021   11:13 AM  CMP   Glucose 65 - 99 mg/dL 83  93  161   BUN 7 - 25 mg/dL 10  14  12    Creatinine 0.60 - 1.29 mg/dL 0.96  0.45  4.09   Sodium 135 - 146 mmol/L 140  141  138   Potassium 3.5 - 5.3 mmol/L 4.1  4.3  3.9   Chloride 98 - 110 mmol/L 100  100  99   CO2 20 - 32 mmol/L 31  24  28    Calcium 8.6 - 10.3 mg/dL 9.8  9.8  9.7   Total Protein 6.1 - 8.1 g/dL 7.1  7.1  7.2   Total Bilirubin 0.2 - 1.2 mg/dL 1.2  0.8  1.2   Alkaline Phos 44 - 121 IU/L  80    AST 10 - 40 U/L 22  22  32   ALT 9 - 46 U/L 31  36  52       Latest Ref Rng & Units 05/16/2022    1:49 PM 07/09/2021   11:13 AM 03/08/2021    9:02 AM  CBC  WBC 3.8 - 10.8 Thousand/uL 7.5  7.9  7.0   Hemoglobin 13.2 - 17.1 g/dL 81.1  91.4  78.2   Hematocrit 38.5 - 50.0 % 52.1  49.2  45.4   Platelets 140 - 400 Thousand/uL 221  209  213    Last vitamin D Lab Results  Component Value Date   VD25OH 75 05/16/2022   Lab Results  Component Value Date   TSH 0.52 07/09/2021   Lab Results  Component Value Date   CHOL 173 05/16/2022   HDL 43 05/16/2022   LDLCALC 110 (H) 05/16/2022   TRIG 101 05/16/2022   CHOLHDL 4.0 05/16/2022    HEMOGLOBIN A1C Lab Results  Component Value Date   HGBA1C 4.9 03/08/2021   Lab Results  Component Value Date   TSH 0.52 07/09/2021   Cardiac Studies:   Treadmill exercise stress test 02/24/2017: Indication: Screening for CAD, hypertension The patient exercised on Bruce protocol for 9:44 min. Patient achieved 11.36 METS and reached HR 185 bpm, which is 102 % of maximum age-predicted HR. Stress test terminated due to fatigue. Resting EKG demonstrates Normal sinus rhythm. ST Changes: With peak exercise there was no  ST-T changes of ischemia.   Chest Pain: none. BP Response to Exercise: Normal resting BP- appropriate response. Hypertensive at rest 164/102 mm Hg. However in recovery, patient developed severe hypotension with BP 80/60 mm Hg. Took 10 minutes before he recuperated with normal BP and dizziness subsided. No  arrhythmias. Rec: Patient's BP response is probably normal but exaggerated response with vasodilatation post exercise. Excellent effort without chest pain or dyspnea.  Recommend echocardiogram to evaluate LV function.  Coronary CT angiogram 12/07/2018: 1. Coronary calcium score of 83.6. Calcium score is 94 percentile for subjects of the same age, gender and race/ethnicity. Please note that the MESA database starts at age 49.  2. Probably mild <50% ostial and proximal LAD stenosis. There was no evidence for hemodynamically significant stenosis by FFR. Right dominant circulation, normal circumflex and right coronary artery. 3.  No significant extracardiac abnormality.  Coronary CTA 03/10/2021: LM 0 LAD 143 LCx 0 RCA 7.17. Total coronary calcium score 150.17. [place] database not available for age <45. Assuming 47 years of age, patient is in 39 percentile for gender and race matched individual. LM: Normal LAD: Large vessel wraps around the apex, moderate 50 to 69% stenosis due to eccentric mixed plaque in the proximal/mid LAD. CT FFR 0.99, not hemodynamically significant. LCx normal, 2 large obtuse marginals, no stenosis. RCA: Large vessel, no significant plaque burden. LV grossly normal in size and no stigmata of prior infarction. Aorta is normal in measurement, no significant atherosclerotic changes. Aorta is normal in size. Visualized noncardiac structures within normal limits. Compared to 05/10/2016, patient was in the 94th percentile.  Echocardiogram 05/13/2021:  Normal LV systolic function with visual EF 55-60%. Left ventricle cavity  is normal in size. Moderate left ventricular hypertrophy. Normal global  wall motion. Normal diastolic filling pattern, normal LAP. Calculated EF 57%.  No significant valvular heart disease.  Compared to study 02/28/2017 mild MR/TR are now resolved, otherwise no significant change.   EKG   EKG 07/13/2022: Normal sinus rhythm at rate of 86 bpm, early  repolarization.  Normal EKG.  No change from 04/27/2021.   Allergies   Allergies  Allergen Reactions  . Crestor [Rosuvastatin] Other (See Comments)  . Penicillins Other (See Comments)    UNKNOWN/childhood     Current Outpatient Medications:  .  amLODipine (NORVASC) 10 MG tablet, Take 1 tablet (10 mg total) by mouth daily., Disp: 90 tablet, Rfl: 3 .  anastrozole (ARIMIDEX) 1 MG tablet, Take 1 mg by mouth. Takes twice a week, Disp: , Rfl:  .  atorvastatin (LIPITOR) 10 MG tablet, Take 1 tablet (10 mg total) by mouth daily., Disp: 30 tablet, Rfl: 2 .  Coenzyme Q10 (COQ10 PO), Take by mouth daily., Disp: , Rfl:  .  fluticasone (FLONASE) 50 MCG/ACT nasal spray, Place 1 spray into the nose daily., Disp: , Rfl:  .  lisinopril-hydrochlorothiazide (ZESTORETIC) 20-12.5 MG tablet, Take 1 tablet by mouth every morning., Disp: 90 tablet, Rfl: 3 .  metoprolol tartrate (LOPRESSOR) 100 MG tablet, Take 1 tablet (100 mg total) by mouth 2 (two) times daily., Disp: 180 tablet, Rfl: 3 .  testosterone enanthate (DELATESTRYL) 200 MG/ML injection, Inject into the muscle once a week. For IM use only, Disp: , Rfl:  .  triamcinolone (KENALOG) 0.025 % cream, Apply 1 Application topically daily., Disp: , Rfl:  .  busPIRone (BUSPAR) 15 MG tablet, Take 1 tablet (15 mg total) by mouth 2 (two) times daily. (Patient not taking: Reported on 07/13/2022), Disp: 30 tablet, Rfl: 1 .  vortioxetine HBr (TRINTELLIX) 5 MG TABS tablet, Take 1 tablet (5 mg total) by mouth daily. (Patient not taking: Reported on 07/13/2022), Disp: 30 tablet, Rfl: 3   Assessment     ICD-10-CM   1. Dyspnea on exertion  R06.09     2. Precordial pain  R07.2     3. Primary hypertension  I10     4. Hypercholesteremia  E78.00 atorvastatin (LIPITOR) 10 MG tablet    5. Elevated coronary artery calcium score 03/10/2021: Total score 150, MESA percentila 97.  R93.1 EKG 12-Lead    atorvastatin (LIPITOR) 10 MG tablet    6. Generalized anxiety disorder  F41.1      7. Soft tissue mass  M79.89 ANA Direct w/Reflex if Positive     Meds ordered this encounter  Medications  . atorvastatin (LIPITOR) 10 MG tablet    Sig: Take 1 tablet (10 mg total) by mouth daily.    Dispense:  30 tablet    Refill:  2   There are no discontinued medications.   Orders Placed This Encounter  Procedures  . ANA Direct w/Reflex if Positive  . EKG 12-Lead    Recommendations:   Paul Freeman is a 47 y.o. Caucasian male with hypertension, extreme anxiety, mild  G6PD deficiency, who has responded well to blood pressure control with lisinopril/HCT, HCT utilized in spite of G6PD deficiency at a low dose which he is tolerating for years now. BP without lisinopril/HCT was uncontrolled.  Past medical history significant for coronary calcium score in the 97 percentile in 2023, no significant coronary disease by coronary CT angiogram, hypercholesterolemia, hypogonadism presently on testosterone supplement and generalized anxiety disorder.  1. Dyspnea on exertion Patient's symptoms of dyspnea is completely atypical, he is able to do heavy exertional activity without any limitation but at at rest sometimes he feels markedly short of breath.  No clinical evidence of heart failure.  I suspect multiple somatic issues are related to extreme anxiety, component of severe ADD.  2. Precordial pain Chest pain symptoms are clearly musculoskeletal and also present on the left side of the chest, sharp in nature, unrelated to exertion activity.  3. Primary hypertension Blood pressure in excellent control.  4. Hypercholesteremia Patient has discontinued statin therapy due to myalgias, he has only mild hypercholesterolemia however has elevated coronary calcium score in the 97 percentile.  Will start him on Lipitor 10 mg daily, he will need lipid profile testing in 2 to 3 months.  This can be done by his PCP. - atorvastatin (LIPITOR) 10 MG tablet; Take 1 tablet (10 mg total) by mouth daily.   Dispense: 30 tablet; Refill: 2  5. Elevated coronary artery calcium score 03/10/2021: Total score 150, MESA percentila 97. He needs continued primary prevention.  Coronary CT angiogram was also performed which did not reveal any significant disease but very mild disease in the proximal to mid LAD of no clinical consequence, needs primary prevention strategy.  His elevated coronary calcium score is due to a combination of prior tobacco use disorder and mild hypercholesterolemia.  He has remained abstinent from tobacco. - EKG 12-Lead - atorvastatin (LIPITOR) 10 MG tablet; Take 1 tablet (10 mg total) by mouth daily.  Dispense: 30 tablet; Refill: 2  6. Generalized anxiety disorder Patient recently recommended therapy for the same, he has not started the medications BuSpar and Trintellix which I have recommended that he start the medication.  He also has severe form of ADD, atomoxetine 20 mg can be tried once the  above therapy is established and stable and he is tolerating this.  7. Soft tissue mass He has a very small nodule/lipoma like mass in his left side of his chest, I suspect it is probably nonspecific, a neurofibroma, he has no other masses anywhere else.  He has suprapubic softness, ultrasound has been ordered, suspect this could be fat tissue.  To exclude any rare connective tissue disease, will obtain ANA.  - ANA Direct w/Reflex if Positive  Otherwise patient is clearly stable from cardiac standpoint, I will see him back on a as needed basis.   Yates Decamp, MD, [facility] 07/13/2022, 11:10 AM Office: [phone] Fax: [phone] Pager: [phone]

## 2022-07-14 ENCOUNTER — Encounter: Payer: Self-pay | Admitting: Family Medicine

## 2022-07-18 ENCOUNTER — Other Ambulatory Visit: Payer: Self-pay | Admitting: Cardiology

## 2022-07-18 ENCOUNTER — Encounter: Payer: Self-pay | Admitting: Cardiology

## 2022-07-18 DIAGNOSIS — I10 Essential (primary) hypertension: Secondary | ICD-10-CM

## 2022-07-18 DIAGNOSIS — E7800 Pure hypercholesterolemia, unspecified: Secondary | ICD-10-CM

## 2022-07-18 DIAGNOSIS — R931 Abnormal findings on diagnostic imaging of heart and coronary circulation: Secondary | ICD-10-CM

## 2022-07-18 MED ORDER — LISINOPRIL-HYDROCHLOROTHIAZIDE 20-12.5 MG PO TABS: 1.0000 | ORAL_TABLET | ORAL | 0 refills | Status: DC

## 2022-07-18 MED ORDER — ATORVASTATIN CALCIUM 10 MG PO TABS: 10.0000 mg | ORAL_TABLET | Freq: Every day | ORAL | 0 refills | Status: DC

## 2022-07-18 MED ORDER — AMLODIPINE BESYLATE 10 MG PO TABS: 10.0000 mg | ORAL_TABLET | Freq: Every day | ORAL | 0 refills | Status: DC

## 2022-07-18 MED ORDER — METOPROLOL TARTRATE 100 MG PO TABS: 100.0000 mg | ORAL_TABLET | Freq: Two times a day (BID) | ORAL | 0 refills | Status: DC

## 2022-07-23 ENCOUNTER — Telehealth: Payer: Self-pay

## 2022-07-23 NOTE — Telephone Encounter (Addendum)
Initiated Prior authorization GNF:[id number] (formerly Brintellix) 5MG  tablets Via: Covermymeds Case/Key:[id number] Status: denied as of 07/23/22 Reason:The request for coverage for TRINTELLIX TAB 5MG , use as directed (30 per month), is denied. This decision is based on health plan criteria for TRINTELLIX TAB 5MG . This medicine is covered only if: You have failed a four week trial or cannot use two of any formulation of the following (dates of trial must be provided): Citalopram, duloxetine, fluoxetine, fluvoxamine, paroxetine, sertraline. The information provided does not show that you meet the criteria listed above. The reason(s) [employer] did not approve this medication can be found above. This denial is based on our Trintellix drug coverage policy, in addition to any supplementary information you or your prescriber may have submitted. Notified Pt via: Mychart

## 2022-07-27 ENCOUNTER — Encounter: Payer: Self-pay | Admitting: Family Medicine

## 2022-07-28 ENCOUNTER — Ambulatory Visit (INDEPENDENT_AMBULATORY_CARE_PROVIDER_SITE_OTHER): Payer: 59

## 2022-07-28 DIAGNOSIS — M7989 Other specified soft tissue disorders: Secondary | ICD-10-CM

## 2022-07-30 ENCOUNTER — Encounter: Payer: Self-pay | Admitting: Family Medicine

## 2022-07-30 ENCOUNTER — Encounter: Payer: Self-pay | Admitting: Cardiology

## 2022-08-01 ENCOUNTER — Other Ambulatory Visit: Payer: Self-pay | Admitting: Family Medicine

## 2022-08-01 DIAGNOSIS — R161 Splenomegaly, not elsewhere classified: Secondary | ICD-10-CM

## 2022-08-02 ENCOUNTER — Ambulatory Visit: Payer: 59 | Admitting: Family Medicine

## 2022-08-02 ENCOUNTER — Ambulatory Visit (INDEPENDENT_AMBULATORY_CARE_PROVIDER_SITE_OTHER): Payer: 59

## 2022-08-02 VITALS — BP 122/81 | HR 91 | Resp 18 | Ht 74.0 in | Wt 266.8 lb

## 2022-08-02 DIAGNOSIS — R161 Splenomegaly, not elsewhere classified: Secondary | ICD-10-CM

## 2022-08-02 LAB — ANA W/REFLEX IF POSITIVE: Anti Nuclear Antibody (ANA): NEGATIVE

## 2022-08-02 NOTE — Progress Notes (Signed)
Just filling you in

## 2022-08-02 NOTE — Progress Notes (Signed)
Established patient visit   Patient: Paul Freeman   DOB: 03-29-1975   46 y.o. Male  MRN: [id number] Visit Date: 08/02/2022  Today's healthcare provider: Charlton Amor, DO   Chief Complaint  Patient presents with   Flank Pain    SUBJECTIVE    Chief Complaint  Patient presents with   Flank Pain   HPI  Pt presents for follow up on splenomegaly. He is still having some left sided pain.   Review of Systems  Constitutional:  Negative for activity change, fatigue and fever.  Respiratory:  Negative for cough and shortness of breath.   Cardiovascular:  Negative for chest pain.  Gastrointestinal:  Negative for abdominal pain.       Left flank pain  Genitourinary:  Negative for difficulty urinating.       Current Meds  Medication Sig   amLODipine (NORVASC) 10 MG tablet Take 1 tablet (10 mg total) by mouth daily.   atorvastatin (LIPITOR) 10 MG tablet Take 1 tablet (10 mg total) by mouth daily.   busPIRone (BUSPAR) 15 MG tablet Take 1 tablet (15 mg total) by mouth 2 (two) times daily.   Coenzyme Q10 (COQ10 PO) Take by mouth daily.   fluticasone (FLONASE) 50 MCG/ACT nasal spray Place 1 spray into the nose daily.   furosemide (LASIX) 40 MG tablet Take 40 mg by mouth 3 (three) times a week.   lisinopril-hydrochlorothiazide (ZESTORETIC) 20-12.5 MG tablet Take 1 tablet by mouth every morning.   metoprolol tartrate (LOPRESSOR) 100 MG tablet Take 1 tablet (100 mg total) by mouth 2 (two) times daily.   testosterone enanthate (DELATESTRYL) 200 MG/ML injection Inject into the muscle once a week. For IM use only   triamcinolone (KENALOG) 0.025 % cream Apply 1 Application topically daily.    OBJECTIVE    BP 122/81 (BP Location: Right Arm, Patient Position: Sitting, Cuff Size: Large)   Pulse 91   Resp 18   Ht 6\' 2"  (1.88 m)   Wt 266 lb 12 oz (121 kg)   SpO2 97%   BMI 34.25 kg/m   Physical Exam Vitals and nursing note reviewed.  Constitutional:      General: He is not in  acute distress.    Appearance: Normal appearance.  HENT:     Head: Normocephalic and atraumatic.     Right Ear: External ear normal.     Left Ear: External ear normal.     Nose: Nose normal.  Eyes:     Conjunctiva/sclera: Conjunctivae normal.  Cardiovascular:     Rate and Rhythm: Normal rate and regular rhythm.  Pulmonary:     Effort: Pulmonary effort is normal.     Breath sounds: Normal breath sounds.  Abdominal:     General: Abdomen is flat. Bowel sounds are normal.     Palpations: Abdomen is soft.     Tenderness: There is no abdominal tenderness.  Neurological:     General: No focal deficit present.     Mental Status: He is alert and oriented to person, place, and time.  Psychiatric:        Mood and Affect: Mood normal.        Behavior: Behavior normal.        Thought Content: Thought content normal.        Judgment: Judgment normal.        ASSESSMENT & PLAN    Problem List Items Addressed This Visit       Other  Splenomegaly - Primary    - cbc ordered with peripheral smear to investigate for abnormalities  - CXR ordered to look for pleural effusion as a result from splenic abscess  - cmp ordered to evaluate liver function       Relevant Orders   CBC   Pathologist smear review   DG Chest 2 View   Hepatic function panel   Monospot    No follow-ups on file.      No orders of the defined types were placed in this encounter.   Orders Placed This Encounter  Procedures   DG Chest 2 View    Standing Status:   Future    Standing Expiration Date:   08/02/2023    Order Specific Question:   Reason for exam:    Answer:   Cough, assess intra-thoracic pathology    Order Specific Question:   Preferred imaging location?    Answer:   [facility]   CBC   Pathologist smear review   Hepatic function panel   Monospot     Charlton Amor, DO  [facility] at [facility] [phone] (phone) [phone]  (fax)  [facility]

## 2022-08-02 NOTE — Assessment & Plan Note (Signed)
-   cbc ordered with peripheral smear to investigate for abnormalities  - CXR ordered to look for pleural effusion as a result from splenic abscess  - cmp ordered to evaluate liver function

## 2022-08-03 LAB — CBC
HCT: 51.5 % — ABNORMAL HIGH (ref 38.5–50.0)
Hemoglobin: 17.3 g/dL — ABNORMAL HIGH (ref 13.2–17.1)
MCH: 29.8 pg (ref 27.0–33.0)
MCHC: 33.6 g/dL (ref 32.0–36.0)
MCV: 88.8 fL (ref 80.0–100.0)
MPV: 10.4 fL (ref 7.5–12.5)
Platelets: 178 10*3/uL (ref 140–400)
RBC: 5.8 10*6/uL (ref 4.20–5.80)
RDW: 11.9 % (ref 11.0–15.0)
WBC: 7.7 10*3/uL (ref 3.8–10.8)

## 2022-08-03 LAB — HEPATIC FUNCTION PANEL
AG Ratio: 1.9 (calc) (ref 1.0–2.5)
ALT: 36 U/L (ref 9–46)
AST: 20 U/L (ref 10–40)
Albumin: 4.7 g/dL (ref 3.6–5.1)
Alkaline phosphatase (APISO): 66 U/L (ref 36–130)
Bilirubin, Direct: 0.3 mg/dL — ABNORMAL HIGH (ref 0.0–0.2)
Globulin: 2.5 g/dL (calc) (ref 1.9–3.7)
Indirect Bilirubin: 1.2 mg/dL (calc) (ref 0.2–1.2)
Total Bilirubin: 1.5 mg/dL — ABNORMAL HIGH (ref 0.2–1.2)
Total Protein: 7.2 g/dL (ref 6.1–8.1)

## 2022-08-03 LAB — PATHOLOGIST SMEAR REVIEW

## 2022-08-03 LAB — MONONUCLEOSIS SCREEN: Heterophile, Mono Screen: NEGATIVE

## 2022-08-05 ENCOUNTER — Ambulatory Visit: Payer: 59 | Admitting: Family Medicine

## 2022-08-05 ENCOUNTER — Encounter: Payer: Self-pay | Admitting: Family Medicine

## 2022-08-06 ENCOUNTER — Encounter: Payer: Self-pay | Admitting: Family Medicine

## 2022-08-09 NOTE — Telephone Encounter (Signed)
Patient's appointments have been scheduled, see notes below  Appointment 08/16/2022 at  11:00 am with Eileen Stanford- [facility]  Appointment 08/30/2022 at 10:00 am with Serafina Mitchell- [facility]

## 2022-08-15 ENCOUNTER — Other Ambulatory Visit: Payer: Self-pay | Admitting: Family

## 2022-08-15 ENCOUNTER — Encounter: Payer: Self-pay | Admitting: Family Medicine

## 2022-08-15 DIAGNOSIS — R161 Splenomegaly, not elsewhere classified: Secondary | ICD-10-CM

## 2022-08-15 DIAGNOSIS — D751 Secondary polycythemia: Secondary | ICD-10-CM

## 2022-08-16 ENCOUNTER — Inpatient Hospital Stay (HOSPITAL_BASED_OUTPATIENT_CLINIC_OR_DEPARTMENT_OTHER): Payer: 59 | Admitting: Family

## 2022-08-16 ENCOUNTER — Inpatient Hospital Stay: Payer: 59

## 2022-08-16 ENCOUNTER — Encounter: Payer: Self-pay | Admitting: Family

## 2022-08-16 ENCOUNTER — Inpatient Hospital Stay: Payer: 59 | Attending: Hematology & Oncology

## 2022-08-16 ENCOUNTER — Other Ambulatory Visit: Payer: Self-pay

## 2022-08-16 VITALS — BP 112/94 | HR 94 | Temp 98.1°F | Resp 18 | Ht 74.0 in | Wt 262.1 lb

## 2022-08-16 DIAGNOSIS — D751 Secondary polycythemia: Secondary | ICD-10-CM | POA: Diagnosis present

## 2022-08-16 DIAGNOSIS — D171 Benign lipomatous neoplasm of skin and subcutaneous tissue of trunk: Secondary | ICD-10-CM | POA: Diagnosis not present

## 2022-08-16 DIAGNOSIS — Z79899 Other long term (current) drug therapy: Secondary | ICD-10-CM

## 2022-08-16 DIAGNOSIS — D75A Glucose-6-phosphate dehydrogenase (G6PD) deficiency without anemia: Secondary | ICD-10-CM

## 2022-08-16 DIAGNOSIS — R161 Splenomegaly, not elsewhere classified: Secondary | ICD-10-CM

## 2022-08-16 DIAGNOSIS — K760 Fatty (change of) liver, not elsewhere classified: Secondary | ICD-10-CM | POA: Diagnosis not present

## 2022-08-16 DIAGNOSIS — Z87891 Personal history of nicotine dependence: Secondary | ICD-10-CM | POA: Insufficient documentation

## 2022-08-16 DIAGNOSIS — E785 Hyperlipidemia, unspecified: Secondary | ICD-10-CM | POA: Insufficient documentation

## 2022-08-16 DIAGNOSIS — I10 Essential (primary) hypertension: Secondary | ICD-10-CM | POA: Diagnosis not present

## 2022-08-16 DIAGNOSIS — Z86010 Personal history of colonic polyps: Secondary | ICD-10-CM | POA: Insufficient documentation

## 2022-08-16 LAB — CBC WITH DIFFERENTIAL (CANCER CENTER ONLY)
Abs Immature Granulocytes: 0.09 10*3/uL — ABNORMAL HIGH (ref 0.00–0.07)
Basophils Absolute: 0 10*3/uL (ref 0.0–0.1)
Basophils Relative: 1 %
Eosinophils Absolute: 0.2 10*3/uL (ref 0.0–0.5)
Eosinophils Relative: 3 %
HCT: 51.2 % (ref 39.0–52.0)
Hemoglobin: 17.2 g/dL — ABNORMAL HIGH (ref 13.0–17.0)
Immature Granulocytes: 1 %
Lymphocytes Relative: 25 %
Lymphs Abs: 1.6 10*3/uL (ref 0.7–4.0)
MCH: 29.1 pg (ref 26.0–34.0)
MCHC: 33.6 g/dL (ref 30.0–36.0)
MCV: 86.6 fL (ref 80.0–100.0)
Monocytes Absolute: 0.4 10*3/uL (ref 0.1–1.0)
Monocytes Relative: 7 %
Neutro Abs: 4 10*3/uL (ref 1.7–7.7)
Neutrophils Relative %: 63 %
Platelet Count: 198 10*3/uL (ref 150–400)
RBC: 5.91 MIL/uL — ABNORMAL HIGH (ref 4.22–5.81)
RDW: 11.8 % (ref 11.5–15.5)
WBC Count: 6.3 10*3/uL (ref 4.0–10.5)
nRBC: 0 % (ref 0.0–0.2)

## 2022-08-16 LAB — IRON AND IRON BINDING CAPACITY (CC-WL,HP ONLY)
Iron: 131 ug/dL (ref 45–182)
Saturation Ratios: 30 % (ref 17.9–39.5)
TIBC: 442 ug/dL (ref 250–450)
UIBC: 311 ug/dL (ref 117–376)

## 2022-08-16 LAB — CMP (CANCER CENTER ONLY)
ALT: 25 U/L (ref 0–44)
AST: 19 U/L (ref 15–41)
Albumin: 5 g/dL (ref 3.5–5.0)
Alkaline Phosphatase: 67 U/L (ref 38–126)
Anion gap: 7 (ref 5–15)
BUN: 15 mg/dL (ref 6–20)
CO2: 31 mmol/L (ref 22–32)
Calcium: 10.1 mg/dL (ref 8.9–10.3)
Chloride: 102 mmol/L (ref 98–111)
Creatinine: 1.22 mg/dL (ref 0.61–1.24)
GFR, Estimated: >60 mL/min (ref >60)
Glucose, Bld: 102 mg/dL — ABNORMAL HIGH (ref 70–99)
Potassium: 4.1 mmol/L (ref 3.5–5.1)
Sodium: 140 mmol/L (ref 135–145)
Total Bilirubin: 1.8 mg/dL — ABNORMAL HIGH (ref 0.3–1.2)
Total Protein: 7.6 g/dL (ref 6.5–8.1)

## 2022-08-16 LAB — RETICULOCYTES
Immature Retic Fract: 6.6 % (ref 2.3–15.9)
RBC.: 5.9 MIL/uL — ABNORMAL HIGH (ref 4.22–5.81)
Retic Count, Absolute: 105 10*3/uL (ref 19.0–186.0)
Retic Ct Pct: 1.8 % (ref 0.4–3.1)

## 2022-08-16 LAB — SAVE SMEAR(SSMR), FOR PROVIDER SLIDE REVIEW

## 2022-08-16 LAB — LACTATE DEHYDROGENASE: LDH: 137 U/L (ref 98–192)

## 2022-08-16 LAB — FERRITIN: Ferritin: 58 ng/mL (ref 24–336)

## 2022-08-16 NOTE — Progress Notes (Signed)
Hematology/Oncology Consultation   Name: Paul Freeman      MRN: [id number]    Location: Room/bed info not found  Date: 08/16/2022 Time:11:21 AM   REFERRING PHYSICIAN: Morey Hummingbird, DO  REASON FOR CONSULT:  Splenomegaly   DIAGNOSIS: Splenomegaly, hepatic steatosis, erythrocytosis   HISTORY OF PRESENT ILLNESS: Paul Freeman is a 47 yo gentleman with recent splenomegaly (volume 999 cc and length 15.5 cm) and hepatic steatosis noted on US of the abdomen. He was also had a 14 x 8 x 27 mm left flank lipoma. No soft tissue mass.  He has had mild erythrocytosis over the last 3 months.  He does occasionally donate blood with the [employer]. His last donation was in late April 2024.  He notes intermittent discomfort in the left upper abdominal quadrant that radiates into his back. Minimal tenderness on exam. No organomegaly noted. No pain in right side.  He does not drink alcohol.  He does note abdominal bloating after eating and some episodes of nausea that come and go. No vomiting.  His maternal grandmother passed away from cirrhosis at the age of 79.  He has history of mild G6PD deficiency. His brother also has and required a blood transfusion at 4 months old. Patient has not experienced anemia or required transfusional support.  No personal history of thrombotic event. Father had DVT post surgery.  He quit smoking 13 years ago. No recreational drug use.  He has occasional episodes of mild SOB with exertion or left sided pain and takes a break to rest when needed.  No notes intermittent episodes of fatigue since he was in his 20's. Spurts of energy come in waves.  He is still trying to remain active working out and bike riding when he feels like it.  He does experience generalized aches and pains in his joints.  Only surgical history was appendectomy without any complications.  No history of diabetes or thyroid disease.  He had his colonoscopy in 08/2021 with Dr. Sharia Reeve.he had one 3 mm benign  polyp removed from the ascending colon.  No personal history of cancer. His paternal grandfather had bladder, mother had breast, father and paternal uncle (father's twin brother) both had prostate.    No issue with frequent or recurrent infections. No fever, chills, n/v, cough, rash, dizziness, chest pain, palpitations or changes in bowel or bladder habits at this time.  He notes puffiness in his ankles after a long day. This resolves over night.  No numbness or tingling in his extremities.  No falls or syncope reported.  Appetite and hydration are good. He eats a healthy diet cooking most meals at home. He does not eat chicken. Weight is stable at 262 lbs.  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   ROS: All other 10 point review of systems is negative.   PAST MEDICAL HISTORY:   Past Medical History:  Diagnosis Date   Allergy    Possible Penicillin, Pollen   Anal fissure    Anxiety    Arthritis    Depression    G6P deficiency (glucose-6-phosphatase deficiency) (HCC)    Hx of adenomatous polyp of colon 09/13/2021   Hypertension    Mild hyperlipidemia    Pneumonia     ALLERGIES: Allergies  Allergen Reactions   Crestor [Rosuvastatin] Other (See Comments)   Penicillins Other (See Comments)    UNKNOWN/childhood       MEDICATIONS:  Current Outpatient Medications  on File Prior to Visit  Medication Sig Dispense Refill   amLODipine (NORVASC) 10 MG tablet Take 1 tablet (10 mg total) by mouth daily. 90 tablet 0   Coenzyme Q10 (COQ10 PO) Take by mouth daily.     fluticasone (FLONASE) 50 MCG/ACT nasal spray Place 1 spray into the nose daily.     lisinopril-hydrochlorothiazide (ZESTORETIC) 20-12.5 MG tablet Take 1 tablet by mouth every morning. 90 tablet 0   metoprolol tartrate (LOPRESSOR) 100 MG tablet Take 1 tablet (100 mg total) by mouth 2 (two) times daily. 180 tablet 0   testosterone enanthate (DELATESTRYL) 200 MG/ML injection Inject into  the muscle once a week. For IM use only     furosemide (LASIX) 40 MG tablet Take 40 mg by mouth 3 (three) times a week. (Patient not taking: Reported on 08/16/2022)     triamcinolone (KENALOG) 0.025 % cream Apply 1 Application topically daily. (Patient not taking: Reported on 08/16/2022)     No current facility-administered medications on file prior to visit.     PAST SURGICAL HISTORY Past Surgical History:  Procedure Laterality Date   APPENDECTOMY     CYSTECTOMY  2008   cyst removal from left armpit    FAMILY HISTORY: Family History  Problem Relation Age of Onset   Breast cancer Mother    Arthritis Mother    Hypertension Father    Atrial fibrillation Father    Prostate cancer Father    Multiple sclerosis Brother    Colon cancer Neg Hx    Stomach cancer Neg Hx    Pancreatic cancer Neg Hx    Esophageal cancer Neg Hx    Liver disease Neg Hx     SOCIAL HISTORY:  reports that he quit smoking about 11 years ago. His smoking use included cigarettes. He has a 22.50 pack-year smoking history. He has never used smokeless tobacco. He reports that he does not currently use alcohol. He reports that he does not use drugs.  PERFORMANCE STATUS: The patient's performance status is 1 - Symptomatic but completely ambulatory  PHYSICAL EXAM: Most Recent Vital Signs: There were no vitals taken for this visit. There were no vitals taken for this visit.  General Appearance:    Alert, cooperative, no distress, appears stated age  Head:    Normocephalic, without obvious abnormality, atraumatic  Eyes:    PERRL, conjunctiva/corneas clear, EOM's intact, fundi    benign, both eyes             Throat:   Lips, mucosa, and tongue normal; teeth and gums normal  Neck:   Supple, symmetrical, trachea midline, no adenopathy;       thyroid:  No enlargement/tenderness/nodules; no carotid   bruit or JVD  Back:     Symmetric, no curvature, ROM normal, no CVA tenderness  Lungs:     Clear to auscultation  bilaterally, respirations unlabored  Chest wall:    No tenderness or deformity  Heart:    Regular rate and rhythm, S1 and S2 normal, no murmur, rub   or gallop  Abdomen:     Soft, non-tender, bowel sounds active all four quadrants,    no masses, no organomegaly        Extremities:   Extremities normal, atraumatic, no cyanosis or edema  Pulses:   2+ and symmetric all extremities  Skin:   Skin color, texture, turgor normal, no rashes or lesions  Lymph nodes:   Cervical, supraclavicular, and axillary nodes normal  Neurologic:   CNII-XII  intact. Normal strength, sensation and reflexes      throughout    LABORATORY DATA:  Results for orders placed or performed in visit on 08/16/22 (from the past 48 hour(s))  CBC with Differential (Cancer Center Only)     Status: Abnormal   Collection Time: 08/16/22 11:00 AM  Result Value Ref Range   WBC Count 6.3 4.0 - 10.5 K/uL   RBC 5.91 (H) 4.22 - 5.81 MIL/uL   Hemoglobin 17.2 (H) 13.0 - 17.0 g/dL   HCT 16.1 09.6 - 04.5 %   MCV 86.6 80.0 - 100.0 fL   MCH 29.1 26.0 - 34.0 pg   MCHC 33.6 30.0 - 36.0 g/dL   RDW 40.9 81.1 - 91.4 %   Platelet Count 198 150 - 400 K/uL   nRBC 0.0 0.0 - 0.2 %   Neutrophils Relative % 63 %   Neutro Abs 4.0 1.7 - 7.7 K/uL   Lymphocytes Relative 25 %   Lymphs Abs 1.6 0.7 - 4.0 K/uL   Monocytes Relative 7 %   Monocytes Absolute 0.4 0.1 - 1.0 K/uL   Eosinophils Relative 3 %   Eosinophils Absolute 0.2 0.0 - 0.5 K/uL   Basophils Relative 1 %   Basophils Absolute 0.0 0.0 - 0.1 K/uL   Immature Granulocytes 1 %   Abs Immature Granulocytes 0.09 (H) 0.00 - 0.07 K/uL    Comment: Performed at [facility] at [facility], [address], [place], [place] [address]  Save Smear for Provider Slide Review     Status: None   Collection Time: 08/16/22 11:00 AM  Result Value Ref Range   Smear Review SMEAR STAINED AND AVAILABLE FOR REVIEW     Comment: Performed at [facility] at [facility], [address] [place], [place] [address]  Reticulocytes     Status: Abnormal   Collection Time: 08/16/22 11:01 AM  Result Value Ref Range   Retic Ct Pct 1.8 0.4 - 3.1 %   RBC. 5.90 (H) 4.22 - 5.81 MIL/uL   Retic Count, Absolute 105.0 19.0 - 186.0 K/uL   Immature Retic Fract 6.6 2.3 - 15.9 %    Comment: Performed at [facility] at [facility], [address], [place], [place] [address]      RADIOGRAPHY: No results found.     PATHOLOGY: None  ASSESSMENT/PLAN: Mr. Aungst is a 47 yo gentleman with recent splenomegaly (volume 999 cc and length 15.5 cm) and hepatic steatosis noted on US of the abdomen. He was also noted to have a 14 x 8 x 27 mm left flank lipoma.  He has also had mild erythrocytosis over the last 3 months.  He is symptomatic as mentioned above.  CBC and blood smear reviewed with Dr. Myna Hidalgo. Patient has some large platelets. No other abnormality or evidence of malignancy noted.  We have added a JAK 2, G6PD, Hgb fractionation cascade to iron studies and epo level.  LFT's are stable. Total Bili was 1.8.  Follow-up pending results.   All questions were answered. The patient knows to call the clinic with any problems, questions or concerns. We can certainly see the patient much sooner if necessary.  The patient was discussed with Dr. Myna Hidalgo and he is in agreement with the aforementioned.   Eileen Stanford, NP

## 2022-08-17 LAB — ERYTHROPOIETIN: Erythropoietin: 6.7 m[IU]/mL (ref 2.6–18.5)

## 2022-08-18 LAB — GLUCOSE 6 PHOSPHATE DEHYDROGENASE
G6PDH: <0.3 U/g{Hb} — ABNORMAL LOW (ref 3.8–14.2)
Hemoglobin: 17.5 g/dL (ref 13.0–17.7)

## 2022-08-19 LAB — HGB FRACTIONATION CASCADE
Hgb A2: 2.6 % (ref 1.8–3.2)
Hgb A: 97.4 % (ref 96.4–98.8)
Hgb F: 0 % (ref 0.0–2.0)
Hgb S: 0 %

## 2022-08-29 LAB — JAK2 (INCLUDING V617F AND EXON 12), MPL,& CALR-NEXT GEN SEQ

## 2022-08-30 ENCOUNTER — Encounter: Payer: Self-pay | Admitting: Family

## 2022-08-30 ENCOUNTER — Ambulatory Visit (INDEPENDENT_AMBULATORY_CARE_PROVIDER_SITE_OTHER): Payer: 59 | Admitting: Behavioral Health

## 2022-08-30 ENCOUNTER — Encounter: Payer: Self-pay | Admitting: Behavioral Health

## 2022-08-30 DIAGNOSIS — F411 Generalized anxiety disorder: Secondary | ICD-10-CM | POA: Diagnosis not present

## 2022-08-30 DIAGNOSIS — F331 Major depressive disorder, recurrent, moderate: Secondary | ICD-10-CM | POA: Diagnosis not present

## 2022-08-30 NOTE — Progress Notes (Signed)
[facility] Counselor Initial Adult Exam  Name: Paul Freeman Date: 08/30/2022 MRN: [id number] DOB: 01-20-1976 PCP: Charlton Amor, DO  Time spent: 45 minutes 45 minutes spent in person with the patient from 1015 until 11:00 AM  Guardian/Payee: Self  Paperwork requested: No   Reason for Visit /Presenting Problem: Stress, anxiety  Paul Freeman is a 47 year old married male who lives with his wife and 2 adolescent children.  He has been married for 20 years.  He has a 46 year old daughter and 21 year old son.  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  He reports significant stress with that business.  He does have a history of anxiety and depression.  He was with [facility] in 2012 where he tried multiple mood stabilizers and antidepressants including Lamictal, Prozac, lithium, Zoloft.  He currently is working with Dr. Tamera Punt in this office to find a medication that would help with mood stabilization.  He tried BuSpar but said it made him dizzy.  He has been diagnosed with G6D since he was in his teens and says that leads to him being fatigued and achy most of the time.  He has learned how to label that for the most part.  For the past 15 years he has had a pain inside and multiple people have looked at it.  Most said it was a fatty nodule it did not look any further and told Dr. Rennis Golden ordered an ultrasound and they found an enlarged spleen.  They did additional blood work and referred him to a hematologist.  They are still looking for with a trigger for the enlarged spleen he has but also found that red blood cells are leaving before they are mature and they are not sure what is causing that.  That she 60 diagnosis is fairly rare to start with and there could be a connection there.  Oncologist have run test also.  He presents with anxiety related to not knowing what is going on there.  He currently runs his own business and says the stress therapy is  "unbelievable.  His business has always done well and he has done well financially.  He says now that he has a great staff of the equipment to cover most automobile diagnostic and/or repair issues.  The past 1-1/2 years have been off in terms of the amount of business they are doing and he has seen that can be cyclical it does not feel very good right now.  He wants to make sure that the people working for him are well taking care of and always concerns him.  He has had to subsidize some of what they would have made in the past.  His wife previously worked for [employer] and financial for 20 years and had a very good job doing very well but they do not have her income now because of multiple health issues that she has had.  They also are on her insurance so is having to pay out of pocket for insurance.  If wife was formally in the [employer].  In 2018 2019 she became sick and had breast cancer resulting in a double mastectomy and reconstructive surgery.  She went through 23 rounds of chemo.  She has had thyroid issues and a combination of all the other issues led to heart failure.  In 2020 she began to improve with the heart issues to got COVID and pneumonia leading to stage III heart failure.  She had an  ICD put in which is half defibrillator and a half pacemaker in January of this year.  She says that has stabilized her health now.  She was let go in October 2023 family, financial and he feels that if she had not been let go she probably would have died but now because she worked throughout all of the medical issues that she had and the stress was becoming too much.  He says he has always been at work for so that the mentality was raised with.  Just over the past couple of years that she started to have a real relationship with his children.  He says he struggles with letting go of work and says it is always running through his head.  He reports that he has always feared failure from his early as he can remember.   He is constantly asking the "what if" questions and says he always feels that he is letting people down his family's employees etc.    The patient grew up in an upper middle class home in [place].  He said his father made very good money and his mother was stay-at-home mom.  He said it was a relatively typical [place] family.  He said growing up in his home was chaotic and there was a lot of yelling.  He said both his mother and father are narcissistic and he feels that he too is narcissistic.  He says he over thinks everything and likes to be in control and says he has even learned how to manipulate himself.  Recently he sent some pod cast to his wife as well as she has read some books on narcissistic behavior and how it affects the family.  He feels that if she had known some of this early on she would not have married him.  There is always been conflict between his wife and his parents but he says there is conflict between his parents and everyone else because of who they are.  He has a brother who is 3 years younger who has no contact with his parents.  He said in September of last year they set a very clear boundary with his parents and he has not spoken to them since then.  He was defending his wife for something which led to an argument.  He denies that his children see his parents if they choose to do so and his daughter cannot drive.  He reports that there is extended conflict with his mother's side of the family including his thoughts.  He is a little closer to an uncle on his father's side of the family but for the most part does not have much contact with either side of his parents families.  He is fairly close to his uncle Joey.  He reports a history of being on and off with his parents and the first time he set a very clear boundaries was in 2008.  He said every time a "reconcile" and feels very fake and does not last for any length of time.  When he was in high school his  parents inexplicably decided to move to [place].  He said they had roots in the [place] area for generations and he still has no explanation as to why they moved.  His father gave up a job which paid extremely well to move to [place] which did not be extremely well.  It the patient was allowed to stay in [place] with cousins and finished high school but his brother had to move to [place] and start over.  He thinks that extended family members may know why they moved but no one has said that to him.  The patient says that one of his faults is that he loves to do things for other people but acknowledges that a lot of times it is based on whether or not he will benefit.  During the time that his wife was going through so many medical issues he was helpful to a male customer more than he should have been.  He said there was never any infidelity but he understands why he looked that way and that created some discord between he and his wife.  He still harbors some guilt about that.  He acknowledges that he manipulates people and feels guilty about it.  Goals will be to reduce his stress and anxiety as well as improve his depression, allow him to work through some of the things which he has done with guilt.  He also wants to look away as this transition plan might be in the long run but also to make some changes now to take some of the stress off of it.  He would like not to think about work so much and figure out how to spend more time/quality time with his family. He does contract for safety having no thoughts of hurting himself or anyone else.  Mental Status Exam: Appearance:   Casual     Behavior:  Appropriate  Motor:  Normal  Speech/Language:   Normal Rate  Affect:  Appropriate  Mood:  anxious  Thought process:  normal  Thought content:    WNL  Sensory/Perceptual disturbances:    WNL  Orientation:  oriented to person, place, time/date, situation, day of week, month of  year, and year  Attention:  Good  Concentration:  Good  Memory:  WNL  Fund of knowledge:   Good  Insight:    Good  Judgment:   Good  Impulse Control:  Good    Reported Symptoms: Anxiety/stress with a history of anxiety and depression.  Risk Assessment: Danger to Self:   None reported Self-injurious Behavior:  None reported Danger to Others: No Duty to Warn:no Physical Aggression / Violence:No  Access to Firearms a concern:  None reported Gang Involvement:No  Patient / guardian was educated about steps to take if suicide or homicide risk level increases between visits: yes While future psychiatric events cannot be accurately predicted, the patient does not currently require acute inpatient psychiatric care and does not currently meet [facility] involuntary commitment criteria.  Substance Abuse History: Current substance abuse: No     Past Psychiatric History:   Previous psychological history is significant for anxiety and depression Outpatient Providers: Dr. Tamera Punt History of Psych Hospitalization:  None reported Psychological Testing:  n/a    Abuse History:  Victim of: No.,  The patient reported that he grew up in a very chaotic household with a lot of yelling with 2 narcissistic parents.    Report needed: No. Victim of Neglect:No. Perpetrator: none reported  Witness / Exposure to Domestic Violence: None reported  Protective Services Involvement: No  Witness to [employer] Violence:   None reported  Family History:  Family History  Problem Relation Age of Onset   Breast cancer Mother    Arthritis Mother    Hypertension Father    Atrial fibrillation Father    Prostate cancer Father  Multiple sclerosis Brother    Colon cancer Neg Hx    Stomach cancer Neg Hx    Pancreatic cancer Neg Hx    Esophageal cancer Neg Hx    Liver disease Neg Hx     Living situation: the patient lives with their family  Sexual Orientation: Straight  Relationship Status: married   Name of spouse / other: If a parent, number of children / ages: The patient has a 56 year old daughter and a 15 year old son  Support Systems: spouse friends Environmental health practitioner Stress:  No   Income/Employment/Disability: Employment  Financial planner: No   Educational History: Education: high school diploma/GED  Religion/Sprituality/World View: Did not discuss  Any cultural differences that may affect / interfere with treatment:  not applicable   Recreation/Hobbies: Did not discuss  Stressors: Marital or family conflict   Occupational concerns   Health Concerns Strengths: Supportive Relationships, Family, Hopefulness, Self Advocate, and Able to Communicate Effectively  Barriers:     Legal History: Pending legal issue / charges:  None reported. History of legal issue / charges:  n/a  Medical History/Surgical History: reviewed Past Medical History:  Diagnosis Date   Allergy    Possible Penicillin, Pollen   Anal fissure    Anxiety    Arthritis    Depression    G6P deficiency (glucose-6-phosphatase deficiency) (HCC)    Hx of adenomatous polyp of colon 09/13/2021   Hypertension    Mild hyperlipidemia    Pneumonia     Past Surgical History:  Procedure Laterality Date   APPENDECTOMY     CYSTECTOMY  2008   cyst removal from left armpit    Medications: Current Outpatient Medications  Medication Sig Dispense Refill   amLODipine (NORVASC) 10 MG tablet Take 1 tablet (10 mg total) by mouth daily. 90 tablet 0   Coenzyme Q10 (COQ10 PO) Take by mouth daily.     fluticasone (FLONASE) 50 MCG/ACT nasal spray Place 1 spray into the nose daily.     furosemide (LASIX) 40 MG tablet Take 40 mg by mouth 3 (three) times a week. (Patient not taking: Reported on 08/16/2022)     lisinopril-hydrochlorothiazide (ZESTORETIC) 20-12.5 MG tablet Take 1 tablet by mouth every morning. 90 tablet 0   metoprolol tartrate (LOPRESSOR) 100 MG tablet Take 1 tablet (100 mg total) by mouth 2 (two)  times daily. 180 tablet 0   testosterone enanthate (DELATESTRYL) 200 MG/ML injection Inject into the muscle once a week. For IM use only     triamcinolone (KENALOG) 0.025 % cream Apply 1 Application topically daily. (Patient not taking: Reported on 08/16/2022)     No current facility-administered medications for this visit.    Allergies  Allergen Reactions   Crestor [Rosuvastatin] Other (See Comments)   Penicillins Other (See Comments)    UNKNOWN/childhood     Diagnoses:  G.A.D.  Plan of Care: I will meet with the patient in person every 2 weeks and a formal treatment plan will be submitted in the next session note.  Goals will be to reduce anxiety/stress related to medical issues, vocational stresses and family stressors.   French Ana, [facility]    Generalized anxiety disorder generalized anxiety disorderG.A.D.

## 2022-08-30 NOTE — Progress Notes (Signed)
                Craig M Peters, LCMHC 

## 2022-09-02 ENCOUNTER — Encounter: Payer: Self-pay | Admitting: Family Medicine

## 2022-09-02 ENCOUNTER — Other Ambulatory Visit: Payer: Self-pay

## 2022-09-02 ENCOUNTER — Ambulatory Visit: Payer: 59 | Admitting: Family Medicine

## 2022-09-02 VITALS — BP 111/87 | HR 81 | Resp 18 | Ht 74.0 in | Wt 265.5 lb

## 2022-09-02 DIAGNOSIS — D75A Glucose-6-phosphate dehydrogenase (G6PD) deficiency without anemia: Secondary | ICD-10-CM

## 2022-09-02 DIAGNOSIS — R1032 Left lower quadrant pain: Secondary | ICD-10-CM

## 2022-09-02 NOTE — Assessment & Plan Note (Addendum)
-   pt has persistent left sided abdominal pain. Unclear etiology if related to g6pd deficiency. Did have [place] that showed splenomegaly but pain persists. Is currently seeing hematology but was unable to get in with them which is what prompted him to present to our clinic today.  - due to persistent abdominal pain without etiology will go ahead and order further imaging of CT abdomen to investigate if this is possibly diverticulitis vs splenomegaly with referred pain - ordering blood work today to look at bilirubin and liver enzymes. No signs of jaundice on exam - encouraged pt to stay away from tylenol as it can cause hemolytic anemia with pts that have g6pd and just use motrin

## 2022-09-02 NOTE — Progress Notes (Signed)
   Acute Office Visit  Subjective:     Patient ID: Paul Freeman, male    DOB: 1975/11/27, 47 y.o.   MRN: [id number]  Chief Complaint  Patient presents with   Follow-up    Pt returns today with complaints of left side flank pain reports that it has gotten worse    HPI Patient is in today for left side pain. Has a pmh of G6PD deficiency and splenomegaly and is currently being followed by hematology.  Review of Systems  Constitutional:  Negative for chills and fever.  Respiratory:  Negative for cough and shortness of breath.   Cardiovascular:  Negative for chest pain.  Gastrointestinal:  Positive for abdominal pain.  Neurological:  Negative for headaches.        Objective:    BP 111/87 (BP Location: Left Arm, Patient Position: Sitting, Cuff Size: Large)   Pulse 81   Resp 18   Ht 6\' 2"  (1.88 m)   Wt 265 lb 8 oz (120.4 kg)   SpO2 100%   BMI 34.09 kg/m    Physical Exam Vitals and nursing note reviewed.  Constitutional:      General: He is not in acute distress.    Appearance: Normal appearance.  HENT:     Head: Normocephalic and atraumatic.     Right Ear: External ear normal.     Left Ear: External ear normal.     Nose: Nose normal.  Eyes:     Conjunctiva/sclera: Conjunctivae normal.  Cardiovascular:     Rate and Rhythm: Normal rate and regular rhythm.  Pulmonary:     Effort: Pulmonary effort is normal.     Breath sounds: Normal breath sounds.  Neurological:     General: No focal deficit present.     Mental Status: He is alert and oriented to person, place, and time.  Psychiatric:        Mood and Affect: Mood normal.        Behavior: Behavior normal.        Thought Content: Thought content normal.        Judgment: Judgment normal.     No results found for any visits on 09/02/22.      Assessment & Plan:   Problem List Items Addressed This Visit       Other   G6PD deficiency Mild   Relevant Orders   CMP14+EGFR   Bilirubin, fractionated  (tot/dir/indir)   Left lower quadrant abdominal pain - Primary    - pt has persistent left sided abdominal pain. Unclear etiology if related to g6pd deficiency. Did have [place] that showed splenomegaly but pain persists. Is currently seeing hematology but was unable to get in with them which is what prompted him to present to our clinic today.  - due to persistent abdominal pain without etiology will go ahead and order further imaging of CT abdomen to investigate if this is possibly diverticulitis vs splenomegaly with referred pain - ordering blood work today to look at bilirubin and liver enzymes. No signs of jaundice on exam - encouraged pt to stay away from tylenol as it can cause hemolytic anemia with pts that have g6pd and just use motrin      Relevant Orders   CT ABDOMEN PELVIS W CONTRAST    No orders of the defined types were placed in this encounter.   Return if symptoms worsen or fail to improve.  Charlton Amor, DO

## 2022-09-06 ENCOUNTER — Encounter: Payer: Self-pay | Admitting: Family Medicine

## 2022-09-06 ENCOUNTER — Telehealth: Payer: Self-pay | Admitting: Family

## 2022-09-06 NOTE — Telephone Encounter (Signed)
I was able to speak with the patient regarding his recent lab results and go over in detail. He is still having pain in the left side and is scheduled for abdominal CT with PCP tomorrow. We will see what these results show and per MD we will move forward with bone marrow biopsy if needed. No questions at this time. Patient in agreement with the plan and appreciative of call.

## 2022-09-07 ENCOUNTER — Ambulatory Visit: Payer: 59

## 2022-09-07 DIAGNOSIS — R1032 Left lower quadrant pain: Secondary | ICD-10-CM | POA: Diagnosis not present

## 2022-09-07 MED ORDER — IOHEXOL 300 MG/ML  SOLN
100.0000 mL | Freq: Once | INTRAMUSCULAR | Status: AC | PRN
  Administered 2022-09-07: 100 mL via INTRAVENOUS

## 2022-09-12 ENCOUNTER — Ambulatory Visit: Payer: 59 | Admitting: Behavioral Health

## 2022-09-12 DIAGNOSIS — F331 Major depressive disorder, recurrent, moderate: Secondary | ICD-10-CM | POA: Diagnosis not present

## 2022-09-12 DIAGNOSIS — F411 Generalized anxiety disorder: Secondary | ICD-10-CM

## 2022-09-12 NOTE — Progress Notes (Unsigned)
                Loriann Bosserman M Neftali Thurow, LCMHC 

## 2022-09-12 NOTE — Progress Notes (Unsigned)
[facility] Counselor/Therapist Progress Note  Patient ID: Paul Freeman, MRN: [id number],    Date: 09/12/2022  Time Spent: 60 minutes spent face-to-face with the patient and his wife in the outpatient therapy office.  Treatment Type: Individual Therapy  Reported Symptoms: anxiety, stress, depression  Mental Status Exam: Appearance:  Well Groomed     Behavior: Appropriate  Motor: Normal  Speech/Language:  Normal Rate  Affect: Appropriate  Mood: anxious  Thought process: normal  Thought content:   WNL  Sensory/Perceptual disturbances:   WNL  Orientation: oriented to person, place, time/date, situation, day of week, and month of year  Attention: Good  Concentration: Good  Memory: WNL  Fund of knowledge:  Good  Insight:   Good  Judgment:  Good  Impulse Control: Good   Risk Assessment: Danger to Self:  No Self-injurious Behavior: No Danger to Others: No Duty to Warn:no Physical Aggression / Violence:No  Access to Firearms a concern: No  Gang Involvement:No   Subjective: I met with the patient and his wife.  He wanted his wife to be in the session at least part of the time to verify some of what he has said.  He agreed to the goals of reducing anxiety and stress.  His wife verified that he is and for the most part always has been hyper focused on work and has been at work first.  That has put a strain on his relationship with his wife and his children which he readily acknowledges.  In the previous session he told me he had set a boundary with his parents because of an ongoing issues.  He decided to go to his parent's house and see if there could be some amends made.  He said his father was genuinely happy to see him but his mother showed no emotion whatsoever nor did she take any ownership and what part she played in the conflict between the patient and his parents.  Both patient and his wife said that his mom has always been like that and she controlled the  marriage and the family but really seemed to have an attitude of not caring.  He acknowledges that he is nature more like his mom and does not like that.  The wife said that he gets angry easily and speaks to she and the kids at times in a very harsh way.  The patient again acknowledges that he does that and does not care.  He says he walks around with a mask on with therapy at work with his family etc. because he wants everybody to think he is in a good place but in reality he says he does not feel much of anything nor does he feel that he cares much about anything.  We talked about nature versus nurture especially in relationship to his biological parents.  We talked about the patient's and his wife's relationship and what that look like and they first met and how that has changed.  His wife has had multiple health issues over the past year or so and is finally in a better place but she says although he was there for her and did not feel like it was because he wanted to but more out of obligation.  We talked about what attracted them to each other and what they see as each others gives talents and strong points.  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  He thinks he may be narcissistic and is clear that his mother is.  The patient does present with flat affect.  For homework I ask the patient his wife to look into the 5 love languages and see how they see each other and themselves in that.  Also ask him to begin questioning in depth what her primary and secondary gains from choices that he makes and how he treats people.  We will also begin to look for why he feels he does not feel anything or care about anything.  He does contract for safety having no thoughts of hurting himself or anyone else.  Interventions: Cognitive Behavioral Therapy  Diagnosis: Generalized anxiety disorder, major  depressive disorder, recurrent, moderate to severe.  Plan: I will meet with the patient every 2 to 3 weeks.  Treatment plan: We will use cognitive behavioral therapy as well as elements of dialectical behavior therapy to help the patient reduce stress and anxiety as well as improve depression at least 50% with a target date of March 10, 2023.  Goals for depression or for him to have less sadness with an increase in brighter emotion as per his report and PHQ-9 score.  We will look at how we can improve mood what may be contributing to depressed mood and learn ways for healthier levels of functioning.  We will identify causes for depressed mood and use crisis plan if any suicidal thoughts occur.  Interventions include explore how depression is experienced in day-to-day living for him as well as using cognitive behavioral therapy to explore and replace unhealthy thoughts and behaviors contributing to depression.  Will provide education about depression and help him understand its symptoms causes and triggers.  We will teach and encouraged use of coping skills as well as challenging negative thoughts cognitive behavioral therapy.  We will make a crisis plan as needed and assess ongoing level of safety.  Goals for anxiety or to improve his ability to better handle stress and reduce ruminations about the "what if some of the work and family, identify causes for anxiety and explore ways to reduce it as well as resolve core conflicts especially related to family that are contributing to anxiety and stress.  The lastly the goal will be to manage thoughts and worrisome thinking contributing to feelings of anxiety.  Interventions included educating him about anxiety and its symptoms and triggers, facilitate problem solution skills were resolving stress, teach coping skills to manage stress and use cognitive behavioral therapy to identify and change anxiety provoking thoughts and behavior patterns.  Lastly we will introduce  dialectical behavior therapy distress tolerance and mindfulness skills to help him learn anxiety management.  French Ana, [facility]

## 2022-09-13 ENCOUNTER — Encounter: Payer: Self-pay | Admitting: Behavioral Health

## 2022-09-13 ENCOUNTER — Encounter: Payer: Self-pay | Admitting: Family Medicine

## 2022-09-14 ENCOUNTER — Encounter: Payer: Self-pay | Admitting: Family

## 2022-09-14 ENCOUNTER — Encounter: Payer: Self-pay | Admitting: *Deleted

## 2022-09-14 ENCOUNTER — Other Ambulatory Visit: Payer: Self-pay | Admitting: Family

## 2022-09-14 DIAGNOSIS — R1012 Left upper quadrant pain: Secondary | ICD-10-CM

## 2022-09-14 DIAGNOSIS — D751 Secondary polycythemia: Secondary | ICD-10-CM

## 2022-09-14 DIAGNOSIS — R161 Splenomegaly, not elsewhere classified: Secondary | ICD-10-CM

## 2022-09-23 ENCOUNTER — Other Ambulatory Visit: Payer: Self-pay | Admitting: Family Medicine

## 2022-09-23 ENCOUNTER — Encounter: Payer: Self-pay | Admitting: Family Medicine

## 2022-09-23 DIAGNOSIS — R1084 Generalized abdominal pain: Secondary | ICD-10-CM

## 2022-09-24 ENCOUNTER — Encounter: Payer: Self-pay | Admitting: Family

## 2022-09-26 ENCOUNTER — Telehealth: Payer: Self-pay | Admitting: *Deleted

## 2022-09-26 ENCOUNTER — Other Ambulatory Visit: Payer: Self-pay | Admitting: Family Medicine

## 2022-09-26 ENCOUNTER — Encounter: Payer: Self-pay | Admitting: Behavioral Health

## 2022-09-26 ENCOUNTER — Ambulatory Visit (INDEPENDENT_AMBULATORY_CARE_PROVIDER_SITE_OTHER): Payer: 59 | Admitting: Behavioral Health

## 2022-09-26 DIAGNOSIS — F411 Generalized anxiety disorder: Secondary | ICD-10-CM

## 2022-09-26 NOTE — Telephone Encounter (Signed)
UA not routine prior to BMBX. Msg sent to pt.

## 2022-09-26 NOTE — Progress Notes (Signed)
[facility] Counselor/Therapist Progress Note  Patient ID: Paul Freeman, MRN: [id number],    Date: 09/26/2022  Time Spent: 60 minutes, 2 PM until 3 PM  spent face-to-face with the patient and his wife in the outpatient therapy office.  Treatment Type: Individual Therapy  Reported Symptoms: anxiety, stress, depression  Mental Status Exam: Appearance:  Well Groomed     Behavior: Appropriate  Motor: Normal  Speech/Language:  Normal Rate  Affect: Appropriate  Mood: anxious  Thought process: normal  Thought content:   WNL  Sensory/Perceptual disturbances:   WNL  Orientation: oriented to person, place, time/date, situation, day of week, and month of year  Attention: Good  Concentration: Good  Memory: WNL  Fund of knowledge:  Good  Insight:   Good  Judgment:  Good  Impulse Control: Good   Risk Assessment: Danger to Self:  No Self-injurious Behavior: No Danger to Others: No Duty to Warn:no Physical Aggression / Violence:No  Access to Firearms a concern: No  Gang Involvement:No   Subjective: The patient does see some benefit in his wife being a part of the session for now although the wife did not particularly want to she was cooperative.  He still feels that she has input that may be valuable.  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  Both the patient and wife indicates that they have gotten together with the patient's parents since our last session and they were as his wife described "neutral."  She explained that at least there was not any arguing or sniping at each other was a nice change of pace.  He feels that his mother who he describes as narcissistic has never liked his wife and has never felt that she was good enough although his wife has always been better and done more than his mother has.  We looked at his relationship with his mother including going back to his  maternal grandparents and how that plays into how he sees things.  He acknowledges that his mother has always enjoyed having the power and manipulating people for her advantage he sees himself in doing that to even if it benefits somebody else in the process he feels that he wants to do for himself.  He also expresses a sense of not having a purpose.  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  We talked about how he could begin to feel more of a hands-on part of the practice that he created.  There are also some other businesses that he and his wife have we talked about how he could feel fulfilled for in those business opportunities.  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  We also talked about breaking the mold of why he and how he sees himself as being exactly like his mother and how and why he can be different. He does contract for safety having no thoughts of hurting himself or anyone else.  Interventions: Cognitive Behavioral Therapy  Diagnosis: Generalized anxiety disorder, major depressive disorder, recurrent, moderate to severe.  Plan: I will meet with the patient every 2 to 3 weeks.  Treatment plan: We will use cognitive behavioral therapy as well as elements of dialectical behavior therapy to  help the patient reduce stress and anxiety as well as improve depression at least 50% with a target date of March 10, 2023.  Goals for depression or for him to have less sadness with an increase in brighter emotion as per his report and PHQ-9 score.  We will look at how we can improve mood what may be contributing to depressed mood and learn ways for healthier levels of functioning.  We will identify causes for depressed mood and use crisis plan if any suicidal thoughts occur.  Interventions include explore how depression is experienced in  day-to-day living for him as well as using cognitive behavioral therapy to explore and replace unhealthy thoughts and behaviors contributing to depression.  Will provide education about depression and help him understand its symptoms causes and triggers.  We will teach and encouraged use of coping skills as well as challenging negative thoughts cognitive behavioral therapy.  We will make a crisis plan as needed and assess ongoing level of safety.  Goals for anxiety or to improve his ability to better handle stress and reduce ruminations about the "what if some of the work and family, identify causes for anxiety and explore ways to reduce it as well as resolve core conflicts especially related to family that are contributing to anxiety and stress.  The lastly the goal will be to manage thoughts and worrisome thinking contributing to feelings of anxiety.  Interventions included educating him about anxiety and its symptoms and triggers, facilitate problem solution skills were resolving stress, teach coping skills to manage stress and use cognitive behavioral therapy to identify and change anxiety provoking thoughts and behavior patterns.  Lastly we will introduce dialectical behavior therapy distress tolerance and mindfulness skills to help him learn anxiety management.  Paul Freeman, [facility]                  Paul Freeman, [facility]

## 2022-09-30 ENCOUNTER — Inpatient Hospital Stay: Payer: 59 | Attending: Hematology & Oncology

## 2022-09-30 ENCOUNTER — Other Ambulatory Visit: Payer: Self-pay | Admitting: Family Medicine

## 2022-09-30 DIAGNOSIS — R1012 Left upper quadrant pain: Secondary | ICD-10-CM

## 2022-09-30 DIAGNOSIS — D751 Secondary polycythemia: Secondary | ICD-10-CM | POA: Diagnosis present

## 2022-09-30 DIAGNOSIS — Z79899 Other long term (current) drug therapy: Secondary | ICD-10-CM | POA: Diagnosis not present

## 2022-09-30 DIAGNOSIS — K4090 Unilateral inguinal hernia, without obstruction or gangrene, not specified as recurrent: Secondary | ICD-10-CM

## 2022-09-30 DIAGNOSIS — R161 Splenomegaly, not elsewhere classified: Secondary | ICD-10-CM

## 2022-09-30 LAB — URINALYSIS, COMPLETE (UACMP) WITH MICROSCOPIC
Bilirubin Urine: NEGATIVE
Glucose, UA: NEGATIVE mg/dL
Hgb urine dipstick: NEGATIVE
Ketones, ur: NEGATIVE mg/dL
Leukocytes,Ua: NEGATIVE
Nitrite: NEGATIVE
Protein, ur: NEGATIVE mg/dL
Specific Gravity, Urine: 1.025 (ref 1.005–1.030)
WBC, UA: NONE SEEN WBC/hpf (ref 0–5)
pH: 6 (ref 5.0–8.0)

## 2022-10-01 LAB — URINE CULTURE: Culture: NO GROWTH

## 2022-10-04 NOTE — Progress Notes (Signed)
No action done

## 2022-10-11 ENCOUNTER — Ambulatory Visit: Payer: 59 | Admitting: Behavioral Health

## 2022-10-11 ENCOUNTER — Encounter: Payer: Self-pay | Admitting: Family Medicine

## 2022-10-12 ENCOUNTER — Encounter: Payer: Self-pay | Admitting: Family Medicine

## 2022-10-12 ENCOUNTER — Other Ambulatory Visit: Payer: Self-pay | Admitting: Radiology

## 2022-10-12 DIAGNOSIS — R161 Splenomegaly, not elsewhere classified: Secondary | ICD-10-CM

## 2022-10-12 NOTE — H&P (Incomplete)
Chief Complaint: Patient was seen in consultation today for splenomegaly  Referring Physician(s): Paul Freeman  Supervising Physician: Paul Freeman  Patient Status: [facility] - Out-pt  History of Present Illness: Paul Freeman is a 47 y.o. male with hhistory of G6P deficiency, HTN found to have erythrocytosis, splenomegaly JAK 2 negative.  He is referred to IR for bone marrow biopsy at the requested for Paul Stanford, NP.    Paul Freeman presents to [facility]iology today in his usual state of health.  He has been NPO.  He does not take blood thinners. His *** is available for post-procedure care and trasnportation.   Past Medical History:  Diagnosis Date   Allergy    Possible Penicillin, Pollen   Anal fissure    Anxiety    Arthritis    Depression    G6P deficiency (glucose-6-phosphatase deficiency) (HCC)    Hx of adenomatous polyp of colon 09/13/2021   Hypertension    Mild hyperlipidemia    Pneumonia     Past Surgical History:  Procedure Laterality Date   APPENDECTOMY     CYSTECTOMY  2008   cyst removal from left armpit    Allergies: Crestor [rosuvastatin] and Penicillins  Medications: Prior to Admission medications   Medication Sig Start Date End Date Taking? Authorizing Provider  amLODipine (NORVASC) 10 MG tablet Take 1 tablet (10 mg total) by mouth daily. 07/18/22   Yates Decamp, MD  anastrozole (ARIMIDEX) 1 MG tablet Take 1 mg by mouth 2 (two) times a week. 08/18/22   [provider]  atorvastatin (LIPITOR) 10 MG tablet Take 10 mg by mouth daily. 08/18/22   [provider]  Coenzyme Q10 (COQ10 PO) Take by mouth daily.    [provider]  fluticasone (FLONASE) 50 MCG/ACT nasal spray Place 1 spray into the nose daily. 06/09/16   [provider]  furosemide (LASIX) 40 MG tablet Take 40 mg by mouth 3 (three) times a week. Patient not taking: Reported on 08/16/2022 07/15/22   [provider]  lisinopril-hydrochlorothiazide  (ZESTORETIC) 20-12.5 MG tablet Take 1 tablet by mouth every morning. 07/18/22   Yates Decamp, MD  metoprolol tartrate (LOPRESSOR) 100 MG tablet Take 1 tablet (100 mg total) by mouth 2 (two) times daily. 07/18/22   Yates Decamp, MD  testosterone enanthate (DELATESTRYL) 200 MG/ML injection Inject into the muscle once a week. For IM use only    [provider]     Family History  Problem Relation Age of Onset   Breast cancer Mother    Arthritis Mother    Hypertension Father    Atrial fibrillation Father    Prostate cancer Father    Multiple sclerosis Brother    Colon cancer Neg Hx    Stomach cancer Neg Hx    Pancreatic cancer Neg Hx    Esophageal cancer Neg Hx    Liver disease Neg Hx     Social History   Socioeconomic History   Marital status: Married    Spouse name: Paul Freeman   Number of children: 2   Years of education: Not on file   Highest education level: Associate degree: academic program  Occupational History   Not on file  Tobacco Use   Smoking status: Former    Current packs/day: 0.00    Average packs/day: 1.5 packs/day for 15.0 years (22.5 ttl pk-yrs)    Types: Cigarettes    Start date: 02/08/1996    Quit date: 02/08/2011    Years since quitting: 11.6  Smokeless tobacco: Never  Vaping Use   Vaping status: Never Used  Substance and Sexual Activity   Alcohol use: Not Currently    Comment: Social once per quarter drinking.   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[place], [employer]   Occasional alcohol former smoker no tobacco or drugs   Social Determinants of Corporate investment banker Strain: Patient Declined (08/02/2022)   Overall Financial Resource Strain (CARDIA)    Difficulty of Paying Living Expenses:  Patient declined  Food Insecurity: Patient Declined (08/02/2022)   Hunger Vital Sign    Worried About Running Out of Food in the Last Year: Patient declined    Ran Out of Food in the Last Year: Patient declined  Transportation Needs: Patient Declined (08/02/2022)   PRAPARE - Administrator, Civil Service (Medical): Patient declined    Freeman of Transportation (Non-Medical): Patient declined  Physical Activity: Not on file  Stress: Not on file  Social Connections: Unknown (09/27/2022)   Received from [employer]   Social Network    Social Network: Not on file     Review of Systems: A 12 point ROS discussed and pertinent positives are indicated in the HPI above.  All other systems are negative.  Review of Systems  Vital Signs: There were no vitals taken for this visit.  Physical Exam       Imaging: No results found.  Labs:  CBC: Recent Labs    05/16/22 1349 08/02/22 0924 08/16/22 1100 08/16/22 1230  WBC 7.5 7.7 6.3  --   HGB 17.5* 17.3* 17.2* 17.5  HCT 52.1* 51.5* 51.2  --   PLT 221 178 198  --     COAGS: No results for input(s): "INR", "APTT" in the last 8760 hours.  BMP: Recent Labs    05/16/22 1349 08/16/22 1100 09/02/22 0944  NA 140 140 140  K 4.1 4.1 4.2  CL 100 102 99  CO2 31 31 25   GLUCOSE 83 102* 83  BUN 10 15 15   CALCIUM 9.8 10.1 9.8  CREATININE 0.97 1.22 1.17  GFRNONAA  --  >60  --     LIVER FUNCTION TESTS: Recent Labs    05/16/22 1349 08/02/22 0924 08/16/22 1100 09/02/22 0944  BILITOT 1.2 1.5* 1.8* 1.4*  AST 22 20 19 25   ALT 31 36 25 29  ALKPHOS  --   --  67 74  PROT 7.1 7.2 7.6 7.0  ALBUMIN  --   --  5.0 4.8    TUMOR MARKERS: No results for input(s): "AFPTM", "CEA", "CA199", "CHROMGRNA" in the last 8760 hours.  Assessment and Plan: Patient with past medical history of HTN presents with complaint of splenomegaly, erythrocytosis.  IR consulted for bone marrow biopsy at the request of Paul Stanford, NP. Case  reviewed by Dr. Fredia Sorrow who approves patient for procedure.  Patient presents today in their usual state of health.  He has been NPO and is not currently on blood thinners.   Risks and benefits of bone marrow biopsy was discussed with the patient and/or patient's family including, but not limited to bleeding, infection, damage to adjacent structures or low yield requiring additional tests.  All of the questions were answered and there is agreement to proceed.  Consent signed and in chart.   Thank you for this interesting consult.  I greatly enjoyed meeting Paul Freeman and look forward to participating in their care.  A copy of this report was sent to the requesting provider on this date.  Electronically Signed: Hoyt Koch, PA 10/12/2022, 4:36 PM   I spent a total of  30 Minutes   in face to face in clinical consultation, greater than 50% of which was counseling/coordinating care for splenomegaly, erythrocytosis.

## 2022-10-13 ENCOUNTER — Ambulatory Visit (HOSPITAL_COMMUNITY)
Admission: RE | Admit: 2022-10-13 | Discharge: 2022-10-13 | Disposition: A | Discharge status: Home / Self Care | Payer: 59 | Source: Ambulatory Visit | Attending: Family | Admitting: Family

## 2022-10-13 ENCOUNTER — Encounter (HOSPITAL_COMMUNITY): Payer: Self-pay

## 2022-10-13 DIAGNOSIS — I10 Essential (primary) hypertension: Secondary | ICD-10-CM | POA: Insufficient documentation

## 2022-10-13 DIAGNOSIS — D550 Anemia due to glucose-6-phosphate dehydrogenase [G6PD] deficiency: Secondary | ICD-10-CM | POA: Diagnosis not present

## 2022-10-13 DIAGNOSIS — Z79899 Other long term (current) drug therapy: Secondary | ICD-10-CM | POA: Insufficient documentation

## 2022-10-13 DIAGNOSIS — Z87891 Personal history of nicotine dependence: Secondary | ICD-10-CM | POA: Insufficient documentation

## 2022-10-13 DIAGNOSIS — R161 Splenomegaly, not elsewhere classified: Secondary | ICD-10-CM | POA: Diagnosis present

## 2022-10-13 DIAGNOSIS — D751 Secondary polycythemia: Secondary | ICD-10-CM | POA: Insufficient documentation

## 2022-10-13 LAB — CBC WITH DIFFERENTIAL/PLATELET
Abs Immature Granulocytes: 0.05 10*3/uL (ref 0.00–0.07)
Basophils Absolute: 0 10*3/uL (ref 0.0–0.1)
Basophils Relative: 0 %
Eosinophils Absolute: 0.1 10*3/uL (ref 0.0–0.5)
Eosinophils Relative: 2 %
HCT: 49.3 % (ref 39.0–52.0)
Hemoglobin: 16.8 g/dL (ref 13.0–17.0)
Immature Granulocytes: 1 %
Lymphocytes Relative: 27 %
Lymphs Abs: 1.7 10*3/uL (ref 0.7–4.0)
MCH: 29.4 pg (ref 26.0–34.0)
MCHC: 34.1 g/dL (ref 30.0–36.0)
MCV: 86.3 fL (ref 80.0–100.0)
Monocytes Absolute: 0.4 10*3/uL (ref 0.1–1.0)
Monocytes Relative: 7 %
Neutro Abs: 3.9 10*3/uL (ref 1.7–7.7)
Neutrophils Relative %: 63 %
Platelets: 173 10*3/uL (ref 150–400)
RBC: 5.71 MIL/uL (ref 4.22–5.81)
RDW: 12.2 % (ref 11.5–15.5)
WBC: 6.2 10*3/uL (ref 4.0–10.5)
nRBC: 0 % (ref 0.0–0.2)

## 2022-10-13 LAB — PROTIME-INR
INR: 1 (ref 0.8–1.2)
Prothrombin Time: 13.5 s (ref 11.4–15.2)

## 2022-10-13 MED ORDER — NALOXONE HCL 0.4 MG/ML IJ SOLN
INTRAMUSCULAR | Status: AC
  Filled 2022-10-13: qty 1

## 2022-10-13 MED ORDER — MIDAZOLAM HCL 2 MG/2ML IJ SOLN
INTRAMUSCULAR | Status: AC | PRN
Start: 2022-10-13 — End: 2022-10-13
  Administered 2022-10-13: 1 mg via INTRAVENOUS

## 2022-10-13 MED ORDER — FENTANYL CITRATE (PF) 100 MCG/2ML IJ SOLN
INTRAMUSCULAR | Status: AC
  Filled 2022-10-13: qty 4

## 2022-10-13 MED ORDER — FENTANYL CITRATE (PF) 100 MCG/2ML IJ SOLN
INTRAMUSCULAR | Status: AC | PRN
Start: 2022-10-13 — End: 2022-10-13
  Administered 2022-10-13: 50 ug via INTRAVENOUS

## 2022-10-13 MED ORDER — FLUMAZENIL 0.5 MG/5ML IV SOLN
INTRAVENOUS | Status: AC
  Filled 2022-10-13: qty 5

## 2022-10-13 MED ORDER — MIDAZOLAM HCL 2 MG/2ML IJ SOLN
INTRAMUSCULAR | Status: AC
  Filled 2022-10-13: qty 4

## 2022-10-13 MED ORDER — SODIUM CHLORIDE 0.9 % IV SOLN: INTRAVENOUS | Status: DC

## 2022-10-13 MED ORDER — ONDANSETRON HCL 4 MG/2ML IJ SOLN
4.0000 mg | Freq: Once | INTRAMUSCULAR | Status: AC
  Administered 2022-10-13: 4 mg via INTRAVENOUS
  Filled 2022-10-13: qty 2

## 2022-10-13 MED ORDER — MIDAZOLAM HCL 2 MG/2ML IJ SOLN
INTRAMUSCULAR | Status: AC | PRN
  Administered 2022-10-13: 1 mg via INTRAVENOUS

## 2022-10-13 NOTE — Procedures (Signed)
Interventional Radiology Procedure Note  Procedure: CT guided bone marrow aspiration and biopsy  Complications: None  EBL: < 10 mL  Findings: Aspirate and core biopsy performed of bone marrow in right iliac bone.  Plan: Bedrest supine x 1 hrs  Glenn T. Yamagata, M.D Pager:  [phone]   

## 2022-10-13 NOTE — Discharge Instructions (Signed)
Please call Interventional Radiology clinic [phone] with any questions or concerns. ? ?You may remove your dressing and shower tomorrow. ? ? ?Bone Marrow Aspiration and Bone Marrow Biopsy, Adult, Care After ?This sheet gives you information about how to care for yourself after your procedure. Your health care provider may also give you more specific instructions. If you have problems or questions, contact your health care provider. ?What can I expect after the procedure? ?After the procedure, it is common to have: ?Mild pain and tenderness. ?Swelling. ?Bruising. ?Follow these instructions at home: ?Puncture site care ?Follow instructions from your health care provider about how to take care of the puncture site. Make sure you: ?Wash your hands with soap and water before and after you change your bandage (dressing). If soap and water are not available, use hand sanitizer. ?Change your dressing as told by your health care provider. ?Check your puncture site every day for signs of infection. Check for: ?More redness, swelling, or pain. ?Fluid or blood. ?Warmth. ?Pus or a bad smell.   ?Activity ?Return to your normal activities as told by your health care provider. Ask your health care provider what activities are safe for you. ?Do not lift anything that is heavier than 10 lb (4.5 kg), or the limit that you are told, until your health care provider says that it is safe. ?Do not drive for 24 hours if you were given a sedative during your procedure. ?General instructions ?Take over-the-counter and prescription medicines only as told by your health care provider. ?Do not take baths, swim, or use a hot tub until your health care provider approves. Ask your health care provider if you may take showers. You may only be allowed to take sponge baths. ?If directed, put ice on the affected area. To do this: ?Put ice in a plastic bag. ?Place a towel between your skin and the bag. ?Leave the ice on for 20 minutes, 2-3 times a  day. ?Keep all follow-up visits as told by your health care provider. This is important.   ?Contact a health care provider if: ?Your pain is not controlled with medicine. ?You have a fever. ?You have more redness, swelling, or pain around the puncture site. ?You have fluid or blood coming from the puncture site. ?Your puncture site feels warm to the touch. ?You have pus or a bad smell coming from the puncture site. ?Summary ?After the procedure, it is common to have mild pain, tenderness, swelling, and bruising. ?Follow instructions from your health care provider about how to take care of the puncture site and what activities are safe for you. ?Take over-the-counter and prescription medicines only as told by your health care provider. ?Contact a health care provider if you have any signs of infection, such as fluid or blood coming from the puncture site. ?This information is not intended to replace advice given to you by your health care provider. Make sure you discuss any questions you have with your health care provider. ?Document Revised: 06/12/2018 Document Reviewed: 06/12/2018 ?Elsevier Patient Education ? 2021 Elsevier Inc. ? ? ?Moderate Conscious Sedation, Adult, Care After ?This sheet gives you information about how to care for yourself after your procedure. Your health care provider may also give you more specific instructions. If you have problems or questions, contact your health care provider. ?What can I expect after the procedure? ?After the procedure, it is common to have: ?Sleepiness for several hours. ?Impaired judgment for several hours. ?Difficulty with balance. ?Vomiting if you eat too   soon. ?Follow these instructions at home: ?For the time period you were told by your health care provider: ?Rest. ?Do not participate in activities where you could fall or become injured. ?Do not drive or use machinery. ?Do not drink alcohol. ?Do not take sleeping pills or medicines that cause drowsiness. ?Do not  make important decisions or sign legal documents. ?Do not take care of children on your own.  ?  ?  ?Eating and drinking ?Follow the diet recommended by your health care provider. ?Drink enough fluid to keep your urine pale yellow. ?If you vomit: ?Drink water, juice, or soup when you can drink without vomiting. ?Make sure you have little or no nausea before eating solid foods.   ?General instructions ?Take over-the-counter and prescription medicines only as told by your health care provider. ?Have a responsible adult stay with you for the time you are told. It is important to have someone help care for you until you are awake and alert. ?Do not smoke. ?Keep all follow-up visits as told by your health care provider. This is important. ?Contact a health care provider if: ?You are still sleepy or having trouble with balance after 24 hours. ?You feel light-headed. ?You keep feeling nauseous or you keep vomiting. ?You develop a rash. ?You have a fever. ?You have redness or swelling around the IV site. ?Get help right away if: ?You have trouble breathing. ?You have new-onset confusion at home. ?Summary ?After the procedure, it is common to feel sleepy, have impaired judgment, or feel nauseous if you eat too soon. ?Rest after you get home. Know the things you should not do after the procedure. ?Follow the diet recommended by your health care provider and drink enough fluid to keep your urine pale yellow. ?Get help right away if you have trouble breathing or new-onset confusion at home. ?This information is not intended to replace advice given to you by your health care provider. Make sure you discuss any questions you have with your health care provider. ?Document Revised: 05/24/2019 Document Reviewed: 12/20/2018 ?Elsevier Patient Education ? 2021 Elsevier Inc.  ?

## 2022-10-14 LAB — SURGICAL PATHOLOGY

## 2022-10-17 ENCOUNTER — Other Ambulatory Visit: Payer: Self-pay | Admitting: Family

## 2022-10-17 ENCOUNTER — Telehealth: Payer: Self-pay | Admitting: Family

## 2022-10-17 DIAGNOSIS — R161 Splenomegaly, not elsewhere classified: Secondary | ICD-10-CM

## 2022-10-17 DIAGNOSIS — D751 Secondary polycythemia: Secondary | ICD-10-CM

## 2022-10-17 DIAGNOSIS — D75A Glucose-6-phosphate dehydrogenase (G6PD) deficiency without anemia: Secondary | ICD-10-CM

## 2022-10-17 NOTE — Telephone Encounter (Signed)
I was able to speak with the patient and go over recent lab results including bone marrow biopsy. Thankfully our work up was negative for any underlying myeloproliferative disease. Patient continues to do well and is currently following up with GI. No questions or concerns at this time. We will plan to see him again in 6 months. Patient appreciative of call.

## 2022-10-19 ENCOUNTER — Telehealth: Payer: Self-pay | Admitting: Family Medicine

## 2022-10-19 ENCOUNTER — Telehealth: Payer: Self-pay | Admitting: *Deleted

## 2022-10-19 ENCOUNTER — Encounter: Payer: Self-pay | Admitting: Family Medicine

## 2022-10-19 NOTE — Telephone Encounter (Signed)
Mickie Bail,  Spoke with one of their very nice referral specialist Martie Lee at [facility]) and she called this patient's insurance to verify that you still in fact have to enter this referral and get an Auth number through the [facility] website since the patient has [facility].They are requesting you get this completed first thing in the AM so they do not have to cancel patients appt tomorrow in their office and so insurance will cover the visit. Please call Suberina at [facility] with an update [phone].

## 2022-10-19 NOTE — Telephone Encounter (Signed)
Called patient and was unable to lvm of upcoming appointment- mailbox full - mailed calendar.

## 2022-10-19 NOTE — Telephone Encounter (Signed)
Received incoming call from [facility] stating that insurance requires Prior Auth to be completed by PCP's office prior to patient to be seen for consultation.

## 2022-10-19 NOTE — Telephone Encounter (Signed)
Returned call patient to [facility],    No prior Authorization required for CPT codes [phone], [id number]    dx: K40.90   Reference # [id number]

## 2022-10-19 NOTE — Telephone Encounter (Signed)
Contacted [employer] insurance ( spoke with [employer]ayer [employer] I[employer] [phone], No prior Authorization required for CPT codes [phone], [id number]   dx: K40.90  Reference # [id number]

## 2022-10-20 NOTE — Telephone Encounter (Signed)
[facility] Referral has been completed, Referral ID [id number] effective 10/20/2022-04/18/2023 5 visits.      Spoke with Paul Freeman- [employer] [phone], No authorization is required for CPT code 69629,5284 Dx: K40.90   Referral is needed, PCP needs to log into [facility] web portal to create referral. Reference # [id number].

## 2022-10-20 NOTE — Telephone Encounter (Signed)
[facility] Referral has been completed, Referral ID [id number] effective 10/20/2022-04/18/2023 5 visits.    Returned [facility] call back, she was given this information and stated Referral had already been completed by Victorino Dike.   See previous notes and referral dated 10/19/2022.

## 2022-10-20 NOTE — Telephone Encounter (Signed)
I submitted the referral to [facility] and got ID# [id number] for 6 visits approved to [facility] Dr. Sheliah Hatch  Valid from today -- April 17, 2023 and spoke with Lisabeth Register. At the insurance company

## 2022-10-20 NOTE — Telephone Encounter (Signed)
[facility] Referral has been completed, Referral ID [id number] effective 10/20/2022-04/18/2023 5 visits.   Returned [facility] call back, she was given this information and stated Referral had already been completed by Victorino Dike.

## 2022-10-20 NOTE — Telephone Encounter (Signed)
Spoke with Paul Freeman- [employer] [phone], No authorization is required for CPT code 57846,9629 Dx: K40.90  Referral is needed, PCP needs to log into [facility] web portal to create referral. Reference # [id number].

## 2022-10-20 NOTE — Telephone Encounter (Signed)
Received incoming call from [facility] requesting referral be completed through [employer]. Martie Lee was advised that I would to contact her back after I spoke with patients insurance company.

## 2022-10-20 NOTE — Telephone Encounter (Signed)
Mr. Veronica, I have contacted your insurance company and below was information I was given. Please let me know if there is something else you need.

## 2022-10-20 NOTE — Telephone Encounter (Signed)
Forgot to mention in my previous note that I submitted referral for approval because [facility] reached out to [facility] yesterday 9/11 at 9 AM and she informed them this did not require Auth, so they called back and asked for me to help them because they knew this plan requires being submitted through the portal with this plan, so I sent Mickie Bail a phone note on 10/19/22 and message her Yesterday. Mickie Bail called me and told me this does not require Auth and she does not have a portal log in. [facility] talk to Mickie Bail again this morning on 10/20/22  trying to prevent canceling pts appt today and Sabrina with [facility] informed me that [place] told them she did not have access to a portal. I knew Mickie Bail had reached out to [place] trying to get access but when [facility] (Sabrina)called and asked for me this morning on 10/20/22  I went ahead and submitted the approval in hopes to help out and prevent pts appt getting canceled today with [facility]. I gave Martie Lee all the Referral Approval info with Approval ID# and validation dates and made Tosha aware.

## 2022-10-21 ENCOUNTER — Ambulatory Visit: Payer: 59 | Admitting: Family Medicine

## 2022-10-21 NOTE — Progress Notes (Deleted)
Acute Office Visit  Subjective:     Patient ID: Paul Freeman, male    DOB: 01/15/1976, 46 y.o.   MRN: [id number]  No chief complaint on file.   HPI Patient is in today for concerns  ROS      Objective:    There were no vitals taken for this visit. {Vitals History (Optional):23777}  Physical Exam  No results found for any visits on 10/21/22.      Assessment & Plan:   Problem List Items Addressed This Visit   None   No orders of the defined types were placed in this encounter.   No follow-ups on file.  Charlton Amor, DO

## 2022-10-24 ENCOUNTER — Ambulatory Visit: Payer: 59 | Admitting: Behavioral Health

## 2022-10-25 ENCOUNTER — Encounter (HOSPITAL_COMMUNITY): Payer: Self-pay | Admitting: Hematology & Oncology

## 2022-10-28 ENCOUNTER — Encounter: Payer: Self-pay | Admitting: Cardiology

## 2022-10-28 ENCOUNTER — Other Ambulatory Visit: Payer: Self-pay | Admitting: Family Medicine

## 2022-10-28 DIAGNOSIS — I10 Essential (primary) hypertension: Secondary | ICD-10-CM

## 2022-10-31 ENCOUNTER — Encounter: Payer: Self-pay | Admitting: Family Medicine

## 2022-10-31 ENCOUNTER — Encounter: Payer: Self-pay | Admitting: Cardiology

## 2022-10-31 ENCOUNTER — Other Ambulatory Visit: Payer: Self-pay | Admitting: Family Medicine

## 2022-10-31 DIAGNOSIS — I10 Essential (primary) hypertension: Secondary | ICD-10-CM

## 2022-10-31 MED ORDER — AMLODIPINE BESYLATE 10 MG PO TABS
10.0000 mg | ORAL_TABLET | Freq: Every day | ORAL | 3 refills | Status: DC
Start: 2022-10-31 — End: 2023-11-16

## 2022-10-31 MED ORDER — AMLODIPINE BESYLATE 10 MG PO TABS
10.0000 mg | ORAL_TABLET | Freq: Every day | ORAL | 2 refills | Status: DC
Start: 2022-10-31 — End: 2022-10-31

## 2022-10-31 MED ORDER — VORTIOXETINE HBR 5 MG PO TABS: 5.0000 mg | ORAL_TABLET | Freq: Every day | ORAL | 3 refills | Status: DC

## 2022-11-01 NOTE — Telephone Encounter (Signed)
Prior authorization has been submitted.

## 2022-11-07 ENCOUNTER — Other Ambulatory Visit: Payer: Self-pay | Admitting: Family Medicine

## 2022-11-07 DIAGNOSIS — F411 Generalized anxiety disorder: Secondary | ICD-10-CM

## 2022-11-07 DIAGNOSIS — R1084 Generalized abdominal pain: Secondary | ICD-10-CM

## 2022-11-07 MED ORDER — ESCITALOPRAM OXALATE 10 MG PO TABS
10.0000 mg | ORAL_TABLET | Freq: Every day | ORAL | 2 refills | Status: DC
Start: 2022-11-07 — End: 2022-11-10

## 2022-11-10 ENCOUNTER — Other Ambulatory Visit: Payer: Self-pay | Admitting: Family Medicine

## 2022-11-10 ENCOUNTER — Encounter: Payer: Self-pay | Admitting: Family Medicine

## 2022-11-10 DIAGNOSIS — F411 Generalized anxiety disorder: Secondary | ICD-10-CM

## 2022-11-10 MED ORDER — VORTIOXETINE HBR 5 MG PO TABS
5.0000 mg | ORAL_TABLET | Freq: Every day | ORAL | 3 refills | Status: DC
Start: 2022-11-10 — End: 2023-03-15

## 2022-11-11 ENCOUNTER — Other Ambulatory Visit: Payer: Self-pay | Admitting: Family Medicine

## 2022-11-11 DIAGNOSIS — R202 Paresthesia of skin: Secondary | ICD-10-CM

## 2022-11-14 ENCOUNTER — Telehealth: Payer: Self-pay | Admitting: General Practice

## 2022-11-14 NOTE — Transitions of Care (Post Inpatient/ED Visit) (Signed)
   11/14/2022  Name: AHAMED HOFLAND MRN: [id number] DOB: July 12, 1975  Today's TOC FU Call Status: Today's TOC FU Call Status:: Successful TOC FU Call Completed TOC FU Call Complete Date: 11/14/22 Patient's Name and Date of Birth confirmed.  Transition Care Management Follow-up Telephone Call Date of Discharge: 11/11/22 Discharge Facility: Other ([facility]) Name of Other ([facility]) Discharge Facility: [facility] Type of Discharge: Emergency Department Reason for ED Visit: Other: (bad reaction to medication)  Items Reviewed:   Patient has already contacted PCP.    SIGNATURE Modesto Charon, RN BSN Nurse Health Advisor

## 2022-11-18 ENCOUNTER — Telehealth: Payer: Self-pay

## 2022-11-18 NOTE — Telephone Encounter (Signed)
Initiated Prior authorization FAO:[id number] (formerly Brintellix) 5MG  tablets  Via: Covermymeds Case/Key:[id number] Status: approved as of 11/18/22 Reason:Authorization Expiration Date: 11/01/2023 Notified Pt via: Mychart

## 2022-11-22 ENCOUNTER — Other Ambulatory Visit: Payer: Self-pay | Admitting: Family Medicine

## 2022-11-22 DIAGNOSIS — Z1283 Encounter for screening for malignant neoplasm of skin: Secondary | ICD-10-CM

## 2023-01-20 ENCOUNTER — Encounter: Payer: Self-pay | Admitting: Family Medicine

## 2023-03-12 ENCOUNTER — Encounter: Payer: Self-pay | Admitting: Cardiology

## 2023-03-13 NOTE — Telephone Encounter (Signed)
I spoke with patient. He reports a "gassy pain"  in his left chest that has occurred off and on for the last week. States it is not that painful. Pain lasts a few hours and goes away on it's own.  Patient does not feel it is related to activity.  Will sometimes have shortness of breath at rest and sometimes with exertion such as washing the car.  Appointment made for patient to see Dr Jacinto Halim on 2/5.  ED precautions reviewed with patient.

## 2023-03-15 ENCOUNTER — Encounter: Payer: Self-pay | Admitting: Cardiology

## 2023-03-15 ENCOUNTER — Ambulatory Visit: Payer: 59 | Attending: Cardiology | Admitting: Cardiology

## 2023-03-15 ENCOUNTER — Telehealth: Payer: Self-pay | Admitting: Cardiology

## 2023-03-15 VITALS — BP 130/100 | HR 82 | Resp 16 | Ht 74.0 in | Wt 263.0 lb

## 2023-03-15 DIAGNOSIS — I10 Essential (primary) hypertension: Secondary | ICD-10-CM | POA: Diagnosis not present

## 2023-03-15 DIAGNOSIS — R072 Precordial pain: Secondary | ICD-10-CM | POA: Diagnosis not present

## 2023-03-15 DIAGNOSIS — R0609 Other forms of dyspnea: Secondary | ICD-10-CM | POA: Diagnosis not present

## 2023-03-15 DIAGNOSIS — R931 Abnormal findings on diagnostic imaging of heart and coronary circulation: Secondary | ICD-10-CM

## 2023-03-15 DIAGNOSIS — F411 Generalized anxiety disorder: Secondary | ICD-10-CM

## 2023-03-15 MED ORDER — EZETIMIBE-SIMVASTATIN 10-20 MG PO TABS: 1.0000 | ORAL_TABLET | Freq: Every day | ORAL | 6 refills | Status: DC

## 2023-03-15 MED ORDER — DILTIAZEM HCL 60 MG PO TABS: ORAL_TABLET | ORAL | 0 refills | Status: DC

## 2023-03-15 NOTE — Telephone Encounter (Signed)
[facility] is calling to talk with triage nurse in regards to clarification of medication instructions for diltiazem (CARDIZEM) 60 MG tablet

## 2023-03-15 NOTE — Telephone Encounter (Signed)
 I spoke with pharmacist and clarified instructions.

## 2023-03-15 NOTE — Patient Instructions (Addendum)
 Medication Instructions:  Your physician has recommended you make the following change in your medication: Start Vytorin   10/20 mg by mouth daily every evening Take Cardizem  60 mg three times daily day prior to CT Scan and 2 hours prior to CT Scan  *If you need a refill on your cardiac medications before your next appointment, please call your pharmacy*   Lab Work: Have lab work checked today at [facility] on the first floor--BMP and lipids If you have labs (blood work) drawn today and your tests are completely normal, you will receive your results only by: MyChart Message (if you have MyChart) OR A paper copy in the mail If you have any lab test that is abnormal or we need to change your treatment, we will call you to review the results.   Testing/Procedures: Your physician has requested that you have cardiac CT. Cardiac computed tomography (CT) is a painless test that uses an x-ray machine to take clear, detailed pictures of your heart. For further information please visit [email or web address] Please follow instruction sheet as given.     Follow-Up: At [facility], you and your health needs are our priority.  As part of our continuing mission to provide you with exceptional heart care, we have created designated Provider Care Teams.  These Care Teams include your primary Cardiologist (physician) and Advanced Practice Providers (APPs -  Physician Assistants and Nurse Practitioners) who all work together to provide you with the care you need, when you need it.  We recommend signing up for the patient portal called MyChart.  Sign up information is provided on this After Visit Summary.  MyChart is used to connect with patients for Virtual Visits (Telemedicine).  Patients are able to view lab/test results, encounter notes, upcoming appointments, etc.  Non-urgent messages can be sent to your provider as well.   To learn more about what you can do with MyChart, go to  [email or web address].    Your next appointment:   6 month(s)  Provider:   Gordy Bergamo, MD     Other Instructions     Your cardiac CT will be scheduled at one of the below locations:   [facility] [address] [place], [place] [address] [phone]  OR  [facility] [address] [place], [place] [address] [phone]  OR   [facility] [address] [place], [place] [address] [phone]  OR   [facility] [address] [place], [place] [address] [phone]  If scheduled at [facility], please arrive at the [facility] and Children's Entrance (Entrance C2) of [facility] 30 minutes prior to test start time. You can use the FREE valet parking offered at entrance C (encouraged to control the heart rate for the test)  Proceed to the [facility] Radiology Department (first floor) to check-in and test prep.  All radiology patients and guests should use entrance C2 at [facility], accessed from [facility], even though the hospital's physical address listed is [address].    If scheduled at [facility] or [facility], please arrive 15 mins early for check-in and test prep.  There is spacious parking and easy access to the radiology department from the [facility] V[facility] entrance. Please enter here and check-in with the desk attendant.   If scheduled at [facility] please arrive 30 minutes early for check-in and test  prep.  Please follow these instructions carefully (unless otherwise directed):  An IV will be required for this test and Nitroglycerin  will be given.  Hold all erectile dysfunction medications at least 3 days (72 hrs) prior to test. (Ie viagra, cialis, sildenafil, tadalafil, etc)   Day prior to test- Take Cardizem  60 mg three times daily On  the Night Before the Test: Be sure to Drink plenty of water. Do not consume any caffeinated/decaffeinated beverages or chocolate 12 hours prior to your test. Do not take any antihistamines 12 hours prior to your test.   On the Day of the Test: Drink plenty of water until 1 hour prior to the test. Do not eat any food 1 hour prior to test. You may take your regular medications prior to the test.  Do not take lisinopril  hydrochlorothiazide  the day of the test Take cardizem  60 mg 2 hours prior to CT Scan in addition to normal medications If you take Furosemide/Hydrochlorothiazide /Spironolactone/Chlorthalidone, please HOLD on the morning of the test. Patients who wear a continuous glucose monitor MUST remove the device prior to scanning.  After the Test: Drink plenty of water. After receiving IV contrast, you may experience a mild flushed feeling. This is normal. On occasion, you may experience a mild rash up to 24 hours after the test. This is not dangerous. If this occurs, you can take Benadryl  25 mg, Zyrtec , Claritin, or Allegra and increase your fluid intake. (Patients taking Tikosyn should avoid Benadryl , and may take Zyrtec , Claritin, or Allegra) If you experience trouble breathing, this can be serious. If it is severe call 911 IMMEDIATELY. If it is mild, please call our office.  We will call to schedule your test 2-4 weeks out understanding that some insurance companies will need an authorization prior to the service being performed.   For more information and frequently asked questions, please visit our website : [email or web address]  For non-scheduling related questions, please contact the cardiac imaging nurse navigator should you have any questions/concerns: Cardiac Imaging Nurse Navigators Direct Office Dial: [phone]   For scheduling needs, including cancellations and rescheduling, please call Brittany, [phone].

## 2023-03-15 NOTE — Progress Notes (Signed)
 Cardiology Office Note:  .   Date:  03/15/2023  ID:  Paul Freeman, DOB 01-28-1976, MRN [id number] PCP: Bevin Bernice RAMAN, DO  [facility] Providers Cardiologist:  Gordy Bergamo, MD   History of Present Illness: .   Paul Freeman is a 48 y.o. Caucasian male with hypertension, extreme anxiety, mild  G6PD deficiency, who has responded well to blood pressure control with lisinopril /HCT, HCT utilized in spite of G6PD deficiency at a low dose which he is tolerating for years now. BP without lisinopril /HCT was uncontrolled.  Past medical history significant for coronary calcium  score in the 97 percentile in 2023, no significant coronary disease by coronary CT angiogram on 03/10/2021, hypercholesterolemia, hypogonadism presently on testosterone supplement and generalized anxiety disorder.   Patient presented to the emergency room with abdominal discomfort, palpitations at [facility], ruled out for myocardial infarction and discharged home.  He also underwent an event monitor on 11/28/2022 revealing multiple triggered events but monitor revealing sinus rhythm with isolated and rare PACs.  Discussed the use of AI scribe software for clinical note transcription with the patient, who gave verbal consent to proceed.  History of Present Illness   He experiences chest pain that he describes as stress-induced, occurring during his drive to the appointment and persisting at the time of the visit. The pain is located in his chest and back, sometimes radiating down his arm. He also experiences shortness of breath and describes the discomfort as 'a lot'.  He recalls a previous CT angiogram of the heart conducted in 2023, which showed mild blockages in one of the arteries. He mentions that the pain has worsened over the past two weeks.  He has a history of anxiety and panic attacks, which have previously led to emergency department visits. He describes an incident where he had a panic attack on the highway,  resulting in a visit to the hospital. He has tried various medications for anxiety, including Trintellix , which he found ineffective and costly. He is not currently on any medication to manage his anxiety and feels like a 'nervous wreck'.   Labs   Lab Results  Component Value Date   CHOL 173 05/16/2022   HDL 43 05/16/2022   LDLCALC 110 (H) 05/16/2022   TRIG 101 05/16/2022   CHOLHDL 4.0 05/16/2022   Lab Results  Component Value Date   NA 140 09/02/2022   K 4.2 09/02/2022   CO2 25 09/02/2022   GLUCOSE 83 09/02/2022   BUN 15 09/02/2022   CREATININE 1.17 09/02/2022   CALCIUM  9.8 09/02/2022   GFR 94.91 04/18/2011   EGFR 78 09/02/2022   GFRNONAA >60 08/16/2022      Latest Ref Rng & Units 09/02/2022    9:44 AM 08/16/2022   11:00 AM 05/16/2022    1:49 PM  BMP  Glucose 70 - 99 mg/dL 83  897  83   BUN 6 - 24 mg/dL 15  15  10    Creatinine 0.76 - 1.27 mg/dL 8.82  8.77  9.02   BUN/Creat Ratio 9 - 20 13   SEE NOTE:   Sodium 134 - 144 mmol/L 140  140  140   Potassium 3.5 - 5.2 mmol/L 4.2  4.1  4.1   Chloride 96 - 106 mmol/L 99  102  100   CO2 20 - 29 mmol/L 25  31  31    Calcium  8.7 - 10.2 mg/dL 9.8  89.8  9.8       Latest Ref Rng &  Units 10/13/2022    8:09 AM 08/16/2022   12:30 PM 08/16/2022   11:00 AM  CBC  WBC 4.0 - 10.5 K/uL 6.2   6.3   Hemoglobin 13.0 - 17.0 g/dL 83.1  82.4  82.7   Hematocrit 39.0 - 52.0 % 49.3   51.2   Platelets 150 - 400 K/uL 173   198    Review of Systems  Cardiovascular:  Positive for chest pain and dyspnea on exertion.  Psychiatric/Behavioral:  Positive for hypervigilance. Negative for suicidal ideas.     Physical Exam:   VS:  BP (!) 130/100 (BP Location: Left Arm, Patient Position: Sitting, Cuff Size: Normal)   Pulse 82   Resp 16   Ht 6' 2 (1.88 m)   Wt 263 lb (119.3 kg)   SpO2 97%   BMI 33.77 kg/m    Wt Readings from Last 3 Encounters:  03/15/23 263 lb (119.3 kg)  10/13/22 260 lb (117.9 kg)  09/02/22 265 lb 8 oz (120.4 kg)    Physical  Exam Neck:     Vascular: No carotid bruit or JVD.  Cardiovascular:     Rate and Rhythm: Normal rate and regular rhythm.     Pulses: Intact distal pulses.     Heart sounds: Normal heart sounds. No murmur heard.    No gallop.  Pulmonary:     Effort: Pulmonary effort is normal.     Breath sounds: Normal breath sounds.  Abdominal:     General: Bowel sounds are normal.     Palpations: Abdomen is soft.  Musculoskeletal:     Right lower leg: No edema.     Left lower leg: No edema.    Studies Reviewed: SABRA    Coronary CTA 03/10/2021: LM 0 LAD 143 LCx 0 RCA 7.17. Total coronary calcium  score 150.17. [place] database not available for age <45. Assuming 48 years of age, patient is in 46 percentile for gender and race matched individual. LM: Normal LAD: Large vessel wraps around the apex, moderate 50 to 69% stenosis due to eccentric mixed plaque in the proximal/mid LAD. CT FFR 0.99, not hemodynamically significant. LCx normal, 2 large obtuse marginals, no stenosis. RCA: Large vessel, no significant plaque burden. LV grossly normal in size and no stigmata of prior infarction. Aorta is normal in measurement, no significant atherosclerotic changes. Aorta is normal in size. Visualized noncardiac structures within normal limits. Compared to 05/10/2016, patient was in the 94th percentile.  Echocardiogram 05/13/2021:  Normal LV systolic function with visual EF 55-60%. Left ventricle cavity  is normal in size. Moderate left ventricular hypertrophy. Normal global  wall motion. Normal diastolic filling pattern, normal LAP. Calculated EF 57%.  No significant valvular heart disease.   EKG:    EKG Interpretation Date/Time:  Wednesday March 15 2023 11:44:44 EST Ventricular Rate:  81 PR Interval:  162 QRS Duration:  102 QT Interval:  360 QTC Calculation: 418 R Axis:   55  Text Interpretation: EKG 03/15/2023: Normal sinus rhythm with rate of 81 bpm, normal EKG.  Compared to 01/28/2023, no change.  Confirmed by Robena Ewy, Jagadeesh ([id number]) on 03/15/2023 12:24:27 PM    Medications and allergies    Allergies  Allergen Reactions   Crestor  [Rosuvastatin ] Other (See Comments)   Quinolones Other (See Comments)    Secondary to genetic disorder G6PD causes hemotology anemias as stated per pt    Lexapro  [Escitalopram ] Other (See Comments)    hallucinations   Penicillins Other (See Comments)    UNKNOWN/childhood  Current Outpatient Medications:    amLODipine  (NORVASC ) 10 MG tablet, Take 1 tablet (10 mg total) by mouth daily., Disp: 90 tablet, Rfl: 3   anastrozole (ARIMIDEX) 1 MG tablet, Take 1 mg by mouth 2 (two) times a week., Disp: , Rfl:    Coenzyme Q10 (COQ10 PO), Take by mouth daily., Disp: , Rfl:    diltiazem  (CARDIZEM ) 60 MG tablet, Take one tablet by mouth three times daily day before CT Scan.  Take one tablet by mouth 2 hours prior to CT scan, Disp: 4 tablet, Rfl: 0   esomeprazole (NEXIUM) 10 MG packet, Take 10 mg by mouth daily before breakfast., Disp: , Rfl:    ezetimibe -simvastatin  (VYTORIN ) 10-20 MG tablet, Take 1 tablet by mouth daily., Disp: 30 tablet, Rfl: 6   furosemide (LASIX) 40 MG tablet, Take 40 mg by mouth 3 (three) times a week., Disp: , Rfl:    lisinopril -hydrochlorothiazide  (ZESTORETIC ) 20-12.5 MG tablet, Take 1 tablet by mouth every morning., Disp: 90 tablet, Rfl: 0   metoprolol  tartrate (LOPRESSOR ) 100 MG tablet, Take 1 tablet (100 mg total) by mouth 2 (two) times daily., Disp: 180 tablet, Rfl: 0   testosterone enanthate (DELATESTRYL) 200 MG/ML injection, Inject into the muscle once a week. For IM use only, Disp: , Rfl:    ASSESSMENT AND PLAN: .      ICD-10-CM   1. Precordial pain  R07.2 EKG 12-Lead    Lipid Profile    Basic Metabolic Panel (BMET)    CT CORONARY MORPH W/CTA COR W/SCORE W/CA W/CM &/OR WO/CM    2. Elevated coronary artery calcium  score 03/10/2021: Total score 150, MESA percentila 97.  R93.1 Lipid Profile    Basic Metabolic Panel (BMET)    3.  Dyspnea on exertion  R06.09 Lipid Profile    Basic Metabolic Panel (BMET)    4. Primary hypertension  I10 Lipid Profile    Basic Metabolic Panel (BMET)    5. Generalized anxiety disorder  F41.1 Lipid Profile    Basic Metabolic Panel (BMET)     Assessment and Plan    Chest Pain Intermittent chest pain radiating to the back and arm, associated with shortness of breath. Symptoms have worsened over the past two weeks. Previous CT angiogram in 2023 showed very mild blockages. Differential diagnosis includes cardiac ischemia and anxiety-induced chest pain. Discussed the risks and benefits of a coronary CT angiogram, including potential need for heart catheterization if significant blockages are found. Patient prefers to proceed with the CT angiogram. - Order coronary CT angiogram - Administer diltiazem  60 mg TID one day before and on the day of the CT angiogram - Continue metoprolol  tartrate 100 mg BID - Check lipids and BMP today -Do not suspect acute pulm embolism as there is no pleuritic component, no hemoptysis, no leg edema.  Chest pain is also on and off.  No acute distress. -His current coronary calcium  score in the >90th percentile 2 years ago also had coronary CTA revealing proximal LAD to mid LAD about a 50 to 69% stenosis with FFR negative.  Hence we will proceed with repeating coronary CTA although I would have preferred him to have cardiac catheterization for definitive diagnosis.  Patient prefers to have CTA.  Hyperlipidemia Experiencing side effects from Lipitor and Crestor . Plan to check lipids today.  He has discontinued statins. - Prescribe Vytorin  10/20 mg once daily in the evening - Check lipids today  Primary hypertension Blood pressure is markedly elevated today.  However patient is in extreme  anxiety more and also states that he is in panic mode with chest pain and continues to worry about having a heart attack.  He has had the symptoms chronically. His blood pressure at  home has been very well-controlled at around 120/74 mmHg when he is relaxed.  He is presently on lisinopril  HCT 20/12.5 mg in the morning and amlodipine  10 mg daily along with metoprolol  tartrate 100 mg twice daily.  I did not make any changes to his medications.  He will take in addition diltiazem  60 mg 3 times daily prior to coronary CTA to induce bradycardia.  Anxiety Significant anxiety contributing to chest pain and difficulty functioning. Previous treatment with Trintellix  was ineffective and cost-prohibitive. Discussed the importance of therapy and potential medication adjustments. - Refer to therapy for anxiety management - Discuss potential medication adjustments with primary care provider -Would highly recommend referral to psychiatrist, concerned about potential suicidal ideation although patient did not endorse this today.  He will need therapy both psychotherapy and pharmacologic therapy for his extreme anxiety and panic attacks.  Follow-up - Schedule follow-up appointment in six months.            Signed,  Gordy Bergamo, MD, [facility] 03/15/2023, 12:47 PM [facility] [address] [place], [place] [address] Phone: [phone]. Fax:  [phone]

## 2023-03-16 ENCOUNTER — Encounter: Payer: Self-pay | Admitting: Cardiology

## 2023-03-16 ENCOUNTER — Ambulatory Visit (INDEPENDENT_AMBULATORY_CARE_PROVIDER_SITE_OTHER): Payer: 59 | Admitting: Family Medicine

## 2023-03-16 VITALS — BP 135/94 | HR 90 | Ht 74.0 in | Wt 264.0 lb

## 2023-03-16 DIAGNOSIS — F411 Generalized anxiety disorder: Secondary | ICD-10-CM

## 2023-03-16 LAB — BASIC METABOLIC PANEL
BUN/Creatinine Ratio: 11 (ref 9–20)
BUN: 11 mg/dL (ref 6–24)
CO2: 25 mmol/L (ref 20–29)
Calcium: 9.7 mg/dL (ref 8.7–10.2)
Chloride: 98 mmol/L (ref 96–106)
Creatinine, Ser: 1.04 mg/dL (ref 0.76–1.27)
Glucose: 89 mg/dL (ref 70–99)
Potassium: 3.9 mmol/L (ref 3.5–5.2)
Sodium: 142 mmol/L (ref 134–144)
eGFR: 90 mL/min/{1.73_m2} (ref >59)

## 2023-03-16 LAB — LIPID PANEL
Chol/HDL Ratio: 4 {ratio} (ref 0.0–5.0)
Cholesterol, Total: 159 mg/dL (ref 100–199)
HDL: 40 mg/dL (ref >39)
LDL Chol Calc (NIH): 94 mg/dL (ref 0–99)
Triglycerides: 139 mg/dL (ref 0–149)
VLDL Cholesterol Cal: 25 mg/dL (ref 5–40)

## 2023-03-16 NOTE — Assessment & Plan Note (Addendum)
 Pt returns for increase in anxiety. At this point with hx of multiple side effects from medications: lexapro  causing hallucinations, xanax making him drowsy, zoloft causing bad side effects, buspar  being ineffective, prozac  causing bad side effects I do believe we need to involve psychiatry at this time and I have sent an urgent referral so we can get better control of GAD and panic attacks. He was unable to afford trintellix  even with insurance.  - pt is willing to follow up with therapist, craig peters, and I have gone ahead and messaged him to set up an appointment

## 2023-03-16 NOTE — Patient Instructions (Signed)
 I have emailed Lamond Pilot to see if we can get you an appointment with him   I am waiting to hear back from drug rep about trintellix  savings card.

## 2023-03-16 NOTE — Progress Notes (Addendum)
 LDL is not at target and goal is <70. New medication Vytorin  will get this to goal.   Lab Results  Component Value Date   CHOL 159 03/15/2023   HDL 40 03/15/2023   LDLCALC 94 03/15/2023   TRIG 139 03/15/2023   CHOLHDL 4.0 03/15/2023

## 2023-03-16 NOTE — Progress Notes (Signed)
 Established patient visit   Patient: Paul Freeman   DOB: [birth year]   48 y.o. Male  MRN: [id number] Visit Date: 03/16/2023  Today's healthcare provider: Bernice GORMAN Juneau, DO   Chief Complaint  Patient presents with   Anxiety    Pt states he has had increased Anxiety, to the point he is having panic attacks    SUBJECTIVE    Chief Complaint  Patient presents with   Anxiety    Pt states he has had increased Anxiety, to the point he is having panic attacks   HPI HPI     Anxiety    Additional comments: Pt states he has had increased Anxiety, to the point he is having panic attacks      Last edited by Duwaine Riggs, CMA on 03/16/2023 10:54 AM.      Pt presents for worsening anxiety. He was on trintellix  which was approved by insurance but it does not look like he ever started it. He is having an increase in panic attacks that are causing him to think something is wrong with his heart. He did see his cardiologist yesterday who did a full workup and determined no cardiac cause.  Followed with Lorrene Hasten but has not had an appointment in quite some time.  Review of Systems  Constitutional:  Negative for activity change, fatigue and fever.  Respiratory:  Negative for cough and shortness of breath.   Cardiovascular:  Negative for chest pain.  Gastrointestinal:  Negative for abdominal pain.  Genitourinary:  Negative for difficulty urinating.       Current Meds  Medication Sig   amLODipine  (NORVASC ) 10 MG tablet Take 1 tablet (10 mg total) by mouth daily.   anastrozole (ARIMIDEX) 1 MG tablet Take 1 mg by mouth 2 (two) times a week.   Coenzyme Q10 (COQ10 PO) Take by mouth daily.   diltiazem  (CARDIZEM ) 60 MG tablet Take one tablet by mouth three times daily day before CT Scan.  Take one tablet by mouth 2 hours prior to CT scan   esomeprazole (NEXIUM) 10 MG packet Take 10 mg by mouth daily before breakfast.   ezetimibe -simvastatin  (VYTORIN ) 10-20 MG tablet Take 1 tablet by  mouth daily.   furosemide (LASIX) 40 MG tablet Take 40 mg by mouth 3 (three) times a week.   lisinopril -hydrochlorothiazide  (ZESTORETIC ) 20-12.5 MG tablet Take 1 tablet by mouth every morning.   metoprolol  tartrate (LOPRESSOR ) 100 MG tablet Take 1 tablet (100 mg total) by mouth 2 (two) times daily.   testosterone enanthate (DELATESTRYL) 200 MG/ML injection Inject into the muscle once a week. For IM use only    OBJECTIVE    BP (!) 135/94 (BP Location: Left Arm, Patient Position: Sitting, Cuff Size: Large)   Pulse 90   Ht 6' 2 (1.88 m)   Wt 264 lb (119.7 kg)   SpO2 98%   BMI 33.90 kg/m   Physical Exam Vitals reviewed.  Constitutional:      Appearance: He is well-developed.  HENT:     Head: Normocephalic and atraumatic.  Eyes:     Conjunctiva/sclera: Conjunctivae normal.  Cardiovascular:     Rate and Rhythm: Normal rate.  Pulmonary:     Effort: Pulmonary effort is normal.  Skin:    General: Skin is dry.     Coloration: Skin is not pale.  Neurological:     Mental Status: He is alert and oriented to person, place, and time.  Psychiatric:  Behavior: Behavior normal.        ASSESSMENT & PLAN    Problem List Items Addressed This Visit       Other   GAD (generalized anxiety disorder) - Primary   Pt returns for increase in anxiety. At this point with hx of multiple side effects from medications: lexapro  causing hallucinations, xanax making him drowsy, zoloft causing bad side effects, buspar  being ineffective, prozac  causing bad side effects I do believe we need to involve psychiatry at this time and I have sent an urgent referral so we can get better control of GAD and panic attacks. He was unable to afford trintellix  even with insurance.  - pt is willing to follow up with therapist, craig peters, and I have gone ahead and messaged him to set up an appointment      Relevant Orders   Ambulatory referral to Psychiatry    No follow-ups on file.      No orders of  the defined types were placed in this encounter.   Orders Placed This Encounter  Procedures   Ambulatory referral to Psychiatry    Referral Priority:   Urgent    Referral Type:   Psychiatric    Referral Reason:   Specialty Services Required    Requested Specialty:   Psychiatry    Number of Visits Requested:   1     Bernice GORMAN Juneau, DO  [facility] at [facility] [phone] (phone) [phone] (fax)  [facility]

## 2023-03-19 ENCOUNTER — Encounter: Payer: Self-pay | Admitting: Family Medicine

## 2023-03-23 ENCOUNTER — Ambulatory Visit (HOSPITAL_COMMUNITY): Payer: 59 | Admitting: Psychiatry

## 2023-03-27 ENCOUNTER — Ambulatory Visit: Payer: 59 | Admitting: Behavioral Health

## 2023-03-27 ENCOUNTER — Encounter: Payer: Self-pay | Admitting: Behavioral Health

## 2023-03-27 DIAGNOSIS — F331 Major depressive disorder, recurrent, moderate: Secondary | ICD-10-CM | POA: Diagnosis not present

## 2023-03-27 DIAGNOSIS — F411 Generalized anxiety disorder: Secondary | ICD-10-CM

## 2023-03-27 NOTE — Progress Notes (Addendum)
 [facility] Counselor/Therapist Progress Note  Patient ID: Paul Freeman, MRN: [id number],    Date: 03/27/2023  Time Spent: 60 minutes, 1 PM until 2PM  spent face-to-face with the patient and his wife in the outpatient therapy office.  Treatment Type: Individual Therapy  Reported Symptoms: anxiety, stress, depression  Mental Status Exam: Appearance:  Well Groomed     Behavior: Appropriate  Motor: Normal  Speech/Language:  Normal Rate  Affect: Appropriate  Mood: anxious  Thought process: normal  Thought content:   WNL  Sensory/Perceptual disturbances:   WNL  Orientation: oriented to person, place, time/date, situation, day of week, and month of year  Attention: Good  Concentration: Good  Memory: WNL  Fund of knowledge:  Good  Insight:   Good  Judgment:  Good  Impulse Control: Good   Risk Assessment: Danger to Self:  No Self-injurious Behavior: No Danger to Others: No Duty to Warn:no Physical Aggression / Violence:No  Access to Firearms a concern: No  Gang Involvement:No   Subjective: Approximately 1 week ago the patient and his family lost their 86-month-old dog to a traumatic event.  He said he was doing some work at home and the dog was under his feet so he very gently slid him over with his foot.  He said that the dog started limping a little bit and they decided the next morning to take him to the vet.  Because he was a bulldog and he was breathing heavily to gave him mild sedative to be able to look at him and then sitting back home with the patient's wife.  The wife described him as a little groggy but not completely asleep.  After getting the dog settled the wife and that the patient went back to work.  When the daughter came in from school she saw some things that were different than the routine including their other dog meeting her at the door but not the bulldog.  She found him dead and they think it was a complication from that sedative he was given.   She as well as everyone else in the family is struggling with him being only what they thought was mildly in discomfort to and dying the next day which they think was related to the sedative.  He said they took him to 2 veterinarians hoping something could be done but the second that pronounced him dead.  He said the first that was close for lunch and they could not get him.  Feelings including anger her disappointment sadness.  We talked about the difference between grief and complicated grief.  We talked about what healthy grieving looks like and how they can support each other.  His wife has scheduled an appointment with a therapist for his daughter which is a good idea.  I will continue to meet with the patient and told the patient that if his son wanted some referrals to either call his insurance company or let me know and I would try to give him some recommendations.  He does contract for safety having no thoughts of hurting himself or anyone else.  Interventions: Cognitive Behavioral Therapy  Diagnosis: Generalized anxiety disorder, major depressive disorder, recurrent, moderate   Plan: I will meet with the patient every 2 to 3 weeks.  Treatment plan: We will use cognitive behavioral therapy as well as elements of dialectical behavior therapy to help the patient reduce stress and anxiety as well as improve depression at least 50% with a target date  of March 10, 2023.  Goals for depression or for him to have less sadness with an increase in brighter emotion as per his report and PHQ-9 score.  We will look at how we can improve mood what may be contributing to depressed mood and learn ways for healthier levels of functioning.  We will identify causes for depressed mood and use crisis plan if any suicidal thoughts occur.  Interventions include explore how depression is experienced in day-to-day living for him as well as using cognitive behavioral therapy to explore and replace unhealthy thoughts and  behaviors contributing to depression.  Will provide education about depression and help him understand its symptoms causes and triggers.  We will teach and encouraged use of coping skills as well as challenging negative thoughts cognitive behavioral therapy.  We will make a crisis plan as needed and assess ongoing level of safety.  Goals for anxiety or to improve his ability to better handle stress and reduce ruminations about the "what if some of the work and family, identify causes for anxiety and explore ways to reduce it as well as resolve core conflicts especially related to family that are contributing to anxiety and stress.  The lastly the goal will be to manage thoughts and worrisome thinking contributing to feelings of anxiety.  Interventions included educating him about anxiety and its symptoms and triggers, facilitate problem solution skills were resolving stress, teach coping skills to manage stress and use cognitive behavioral therapy to identify and change anxiety provoking thoughts and behavior patterns.  Lastly we will introduce dialectical behavior therapy distress tolerance and mindfulness skills to help him learn anxiety management.  Progress: 25%  Reviewed treatment plan with extended target date of September 07, 2023  French Ana, [facility]                  French Ana, [facility]               French Ana, [facility]

## 2023-04-18 ENCOUNTER — Ambulatory Visit: Payer: 59 | Admitting: Behavioral Health

## 2023-04-18 ENCOUNTER — Inpatient Hospital Stay: Payer: 59 | Attending: Family Medicine

## 2023-04-18 ENCOUNTER — Inpatient Hospital Stay: Payer: 59 | Admitting: Hematology & Oncology

## 2023-04-21 ENCOUNTER — Encounter: Payer: Self-pay | Admitting: Cardiology

## 2023-04-24 ENCOUNTER — Encounter: Payer: Self-pay | Admitting: Emergency Medicine

## 2023-04-24 ENCOUNTER — Ambulatory Visit
Admission: EM | Admit: 2023-04-24 | Discharge: 2023-04-24 | Disposition: A | Discharge status: Home / Self Care | Attending: Family Medicine | Admitting: Family Medicine

## 2023-04-24 DIAGNOSIS — J3489 Other specified disorders of nose and nasal sinuses: Secondary | ICD-10-CM | POA: Diagnosis not present

## 2023-04-24 DIAGNOSIS — J0100 Acute maxillary sinusitis, unspecified: Secondary | ICD-10-CM | POA: Diagnosis not present

## 2023-04-24 MED ORDER — PREDNISONE 20 MG PO TABS: ORAL_TABLET | ORAL | 0 refills | Status: DC

## 2023-04-24 MED ORDER — DOXYCYCLINE HYCLATE 100 MG PO CAPS
100.0000 mg | ORAL_CAPSULE | Freq: Two times a day (BID) | ORAL | 0 refills | Status: AC
Start: 2023-04-24 — End: 2023-05-01

## 2023-04-24 NOTE — Discharge Instructions (Addendum)
 Advised patient to take medications as directed with food to completion.  Advised patient to take prednisone with first dose of doxycycline for the next 5 of 7 days.  Encouraged to increase daily water intake to 64 ounces per day.  Advised if symptoms worsen and/or unresolved please follow-up with your PCP or here for further evaluation.

## 2023-04-24 NOTE — ED Triage Notes (Signed)
 Patient c/o frontal headache, sinus pressure, no cough, no sore throat x 8 days.  History of allergies.  Patient has taken Nyquil and Dimetapp.

## 2023-04-24 NOTE — ED Provider Notes (Signed)
 [name]    CSN: [id number] Arrival date & time: 04/24/23  0906      History   Chief Complaint Chief Complaint  Patient presents with   Headache    HPI Paul Freeman is a 48 y.o. male.   HPI pleasant 48 year old male presents with frontal headache, sinus pressure/sinus nasal congestion for 8 days.  PMH significant for HTN, anxiety, and mixed HLD.  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   Cervical disc disease 02/24/2019   Elevated coronary artery calcium score 02/24/2019   History of hyperthyroidism 02/24/2019   Numbness of face 01/02/2019   Situational stress 09/19/2018   Neck pain 09/19/2018   G6PD deficiency Mild 09/19/2018   Non-restorative sleep 01/03/2018   Other chronic pain 01/03/2018   Retrognathia 01/03/2018   Loud snoring 01/03/2018   Myalgia 01/03/2018   Chronic fatigue 01/03/2018   Hypertension 06/23/2017   Anxiety    Depression     Past Surgical History:  Procedure Laterality Date   APPENDECTOMY     CYSTECTOMY  2008   cyst removal from left armpit       Home Medications    Prior to Admission medications   Medication Sig Start Date End Date Taking? Authorizing Provider  amLODipine (NORVASC) 10 MG tablet Take 1 tablet (10 mg total) by mouth daily. 10/31/22 10/31/23 Yes Yates Decamp, MD  anastrozole (ARIMIDEX) 1 MG tablet Take 1 mg by mouth 2 (two) times a week. 08/18/22  Yes  [provider]  Coenzyme Q10 (COQ10 PO) Take by mouth daily.   Yes [provider]  doxycycline (VIBRAMYCIN) 100 MG capsule Take 1 capsule (100 mg total) by mouth 2 (two) times daily for 7 days. 04/24/23 05/01/23 Yes Trevor Iha, FNP  esomeprazole (NEXIUM) 10 MG packet Take 10 mg by mouth daily before breakfast.   Yes [provider]  furosemide (LASIX) 40 MG tablet Take 40 mg by mouth 3 (three) times a week. 07/15/22  Yes [provider]  lisinopril-hydrochlorothiazide (ZESTORETIC) 20-12.5 MG tablet Take 1 tablet by mouth every morning. 07/18/22  Yes Yates Decamp, MD  metoprolol tartrate (LOPRESSOR) 100 MG tablet Take 1 tablet (100 mg total) by mouth 2 (two) times daily. 07/18/22  Yes Yates Decamp, MD  predniSONE (DELTASONE) 20 MG tablet Take 3 tabs PO daily x 5 days. 04/24/23  Yes Trevor Iha, FNP  testosterone enanthate (DELATESTRYL) 200 MG/ML injection Inject into the muscle once a week. For IM use only   Yes [provider]  diltiazem (CARDIZEM) 60 MG tablet Take one tablet by mouth three times daily day before CT Scan.  Take one tablet by mouth 2 hours prior to CT scan 03/15/23   Yates Decamp, MD  ezetimibe-simvastatin (VYTORIN) 10-20 MG tablet Take 1 tablet by mouth daily. 03/15/23   Yates Decamp, MD    Family History Family  History  Problem Relation Age of Onset   Breast cancer Mother    Arthritis Mother    Hypertension Father    Atrial fibrillation Father    Prostate cancer Father    Multiple sclerosis Brother    Colon cancer Neg Hx    Stomach cancer Neg Hx    Pancreatic cancer Neg Hx    Esophageal cancer Neg Hx    Liver disease Neg Hx     Social History Social History   Tobacco Use   Smoking status: Former    Current packs/day: 0.00    Average packs/day: 1.5 packs/day for 15.0 years (22.5 ttl pk-yrs)    Types: Cigarettes    Start date: 02/08/1996    Quit date: 02/08/2011    Years since quitting: 12.2   Smokeless tobacco: Never  Vaping  Use   Vaping status: Never Used  Substance Use Topics   Alcohol use: Not Currently    Comment: Social once per quarter drinking.   Drug use: No     Allergies   Crestor [rosuvastatin], Quinolones, Lexapro [escitalopram], and Penicillins   Review of Systems Review of Systems  HENT:  Positive for congestion.   All other systems reviewed and are negative.    Physical Exam Triage Vital Signs ED Triage Vitals  Encounter Vitals Group     BP      Systolic BP Percentile      Diastolic BP Percentile      Pulse      Resp      Temp      Temp src      SpO2      Weight      Height      Head Circumference      Peak Flow      Pain Score      Pain Loc      Pain Education      Exclude from Growth Chart    No data found.  Updated Vital Signs BP 129/85 (BP Location: Left Arm)   Pulse 100   Temp 98.7 F (37.1 C) (Oral)   Resp 18   Ht 6\' 2"  (1.88 m)   Wt 258 lb (117 kg)   SpO2 96%   BMI 33.13 kg/m    Physical Exam Vitals and nursing note reviewed.  Constitutional:      Appearance: Normal appearance. He is obese.  HENT:     Head: Normocephalic and atraumatic.     Right Ear: Tympanic membrane and external ear normal.     Left Ear: Tympanic membrane and external ear normal.     Ears:     Comments: Significant eustachian tube dysfunction noted bilaterally    Nose:     Right Sinus: Maxillary sinus tenderness present.     Left Sinus: Maxillary sinus tenderness present.     Comments: Turbinates are erythematous/edematous    Mouth/Throat:     Mouth: Mucous membranes are moist.     Pharynx: Oropharynx is clear.  Eyes:     Extraocular Movements: Extraocular movements intact.     Conjunctiva/sclera: Conjunctivae normal.     Pupils: Pupils are equal, round, and reactive to light.  Cardiovascular:     Rate and Rhythm: Normal rate and regular rhythm.     Pulses: Normal pulses.     Heart sounds: Normal heart sounds.  Pulmonary:     Effort: Pulmonary effort is normal.      Breath sounds: Normal breath sounds. No wheezing, rhonchi  or rales.  Musculoskeletal:        General: Normal range of motion.     Cervical back: Normal range of motion and neck supple.  Skin:    General: Skin is warm and dry.  Neurological:     General: No focal deficit present.     Mental Status: He is alert and oriented to person, place, and time. Mental status is at baseline.  Psychiatric:        Mood and Affect: Mood normal.        Behavior: Behavior normal.      UC Treatments / Results  Labs (all labs ordered are listed, but only abnormal results are displayed) Labs Reviewed - No data to display  EKG   Radiology No results found.  Procedures Procedures (including critical care time)  Medications Ordered in UC Medications - No data to display  Initial Impression / Assessment and Plan / UC Course  I have reviewed the triage vital signs and the nursing notes.  Pertinent labs & imaging results that were available during my care of the patient were reviewed by me and considered in my medical decision making (see chart for details).     MDM: 1.  Acute maxillary sinusitis, recurrence not specified-Rx'd doxycycline 100 mg capsule: Take 1 capsule twice daily x 7 days; 2.  Sinus pressure-Rx prednisone 20 mg tablet: Take 3 tablets p.o. daily x 5 days. Advised patient to take medications as directed with food to completion.  Advised patient to take prednisone with first dose of doxycycline for the next 5 of 7 days.  Encouraged to increase daily water intake to 64 ounces per day.  Advised if symptoms worsen and/or unresolved please follow-up with your PCP or here for further evaluation.  Final Clinical Impressions(s) / UC Diagnoses   Final diagnoses:  Acute maxillary sinusitis, recurrence not specified  Sinus pressure     Discharge Instructions      Advised patient to take medications as directed with food to completion.  Advised patient to take prednisone with first dose  of doxycycline for the next 5 of 7 days.  Encouraged to increase daily water intake to 64 ounces per day.  Advised if symptoms worsen and/or unresolved please follow-up with your PCP or here for further evaluation.     ED Prescriptions     Medication Sig Dispense Auth. Provider   predniSONE (DELTASONE) 20 MG tablet Take 3 tabs PO daily x 5 days. 15 tablet Trevor Iha, FNP   doxycycline (VIBRAMYCIN) 100 MG capsule Take 1 capsule (100 mg total) by mouth 2 (two) times daily for 7 days. 14 capsule Trevor Iha, FNP      PDMP not reviewed this encounter.   Trevor Iha, FNP 04/24/23 [phone]

## 2023-05-04 ENCOUNTER — Telehealth (HOSPITAL_COMMUNITY): Payer: Self-pay | Admitting: *Deleted

## 2023-05-04 NOTE — Telephone Encounter (Signed)
 Attempted to call patient regarding upcoming cardiac CT appointment. Left message on voicemail with name and callback number  Larey Brick RN Navigator Cardiac Imaging [facility] [phone] Office [phone] Cell

## 2023-05-05 ENCOUNTER — Other Ambulatory Visit: Payer: Self-pay | Admitting: Cardiology

## 2023-05-05 ENCOUNTER — Ambulatory Visit (HOSPITAL_COMMUNITY)
Admission: RE | Admit: 2023-05-05 | Discharge: 2023-05-05 | Disposition: A | Discharge status: Home / Self Care | Source: Ambulatory Visit | Attending: Cardiology | Admitting: Cardiology

## 2023-05-05 ENCOUNTER — Ambulatory Visit (HOSPITAL_BASED_OUTPATIENT_CLINIC_OR_DEPARTMENT_OTHER)
Admission: RE | Admit: 2023-05-05 | Discharge: 2023-05-05 | Disposition: A | Discharge status: Home / Self Care | Source: Ambulatory Visit | Attending: Cardiology | Admitting: Cardiology

## 2023-05-05 DIAGNOSIS — R072 Precordial pain: Secondary | ICD-10-CM | POA: Insufficient documentation

## 2023-05-05 DIAGNOSIS — I2510 Atherosclerotic heart disease of native coronary artery without angina pectoris: Secondary | ICD-10-CM | POA: Diagnosis not present

## 2023-05-05 DIAGNOSIS — R931 Abnormal findings on diagnostic imaging of heart and coronary circulation: Secondary | ICD-10-CM | POA: Insufficient documentation

## 2023-05-05 MED ORDER — NITROGLYCERIN 0.4 MG SL SUBL
0.8000 mg | SUBLINGUAL_TABLET | Freq: Once | SUBLINGUAL | Status: AC
  Administered 2023-05-05: 0.8 mg via SUBLINGUAL

## 2023-05-05 MED ORDER — METOPROLOL TARTRATE 5 MG/5ML IV SOLN: 10.0000 mg | Freq: Once | INTRAVENOUS | Status: DC | PRN

## 2023-05-05 MED ORDER — METOPROLOL TARTRATE 5 MG/5ML IV SOLN
INTRAVENOUS | Status: AC
  Filled 2023-05-05: qty 10

## 2023-05-05 MED ORDER — DILTIAZEM HCL 25 MG/5ML IV SOLN: 10.0000 mg | INTRAVENOUS | Status: DC | PRN

## 2023-05-05 MED ORDER — IOHEXOL 350 MG/ML SOLN
95.0000 mL | Freq: Once | INTRAVENOUS | Status: AC | PRN
  Administered 2023-05-05: 95 mL via INTRAVENOUS

## 2023-05-05 MED ORDER — NITROGLYCERIN 0.4 MG SL SUBL
SUBLINGUAL_TABLET | SUBLINGUAL | Status: AC
  Filled 2023-05-05: qty 2

## 2023-05-05 NOTE — Progress Notes (Signed)
 Patient tolerated CT well. Vital signs stable encourage to drink water throughout day.Reasons explained and verbalized understanding. Ambulated steady gait.

## 2023-05-06 ENCOUNTER — Encounter: Payer: Self-pay | Admitting: Cardiology

## 2023-05-06 ENCOUNTER — Encounter: Payer: Self-pay | Admitting: Behavioral Health

## 2023-05-06 DIAGNOSIS — R0609 Other forms of dyspnea: Secondary | ICD-10-CM

## 2023-05-06 DIAGNOSIS — R072 Precordial pain: Secondary | ICD-10-CM

## 2023-05-06 DIAGNOSIS — Z01812 Encounter for preprocedural laboratory examination: Secondary | ICD-10-CM

## 2023-05-06 NOTE — Progress Notes (Signed)
 Patient is aware of the results. Will schedule for cardiac catheterization as discussed over telephone and he wants to proceed at first available date at the earliest.   Coronary CT angiogram 05/05/2023: Coronary calcium score of 156, 96 percentile for age. Total plaque volume 373, mostly noncalcified plaque, 84th percentile for age.  DPV is severe. Right dominant circulation.  Mid LAD 50 to 69% stenosis.  FFR 0.78.  Distal LAD FFR 0.70.  CT FFR demonstrated possibly hemodynamically flow-limiting lesion in the mid LAD. Recommend cardiac catheterization.

## 2023-05-08 ENCOUNTER — Other Ambulatory Visit: Payer: Self-pay | Admitting: Cardiology

## 2023-05-08 ENCOUNTER — Ambulatory Visit: Payer: 59 | Admitting: Behavioral Health

## 2023-05-08 DIAGNOSIS — I25118 Atherosclerotic heart disease of native coronary artery with other forms of angina pectoris: Secondary | ICD-10-CM

## 2023-05-08 MED ORDER — NITROGLYCERIN 0.4 MG SL SUBL
0.4000 mg | SUBLINGUAL_TABLET | SUBLINGUAL | 1 refills | Status: DC | PRN
Start: 2023-05-08 — End: 2023-09-01

## 2023-05-08 NOTE — Telephone Encounter (Signed)
 Let's do him on Thursday afternoon, it is my half day at the hospital

## 2023-05-08 NOTE — Telephone Encounter (Signed)
 Please schedule him for left heart cath on Thursday afternoon. It is my half day

## 2023-05-08 NOTE — Progress Notes (Signed)
 D/W patient.    ICD-10-CM   1. Coronary artery disease of native artery of native heart with stable angina pectoris (HCC)  I25.118 nitroGLYCERIN (NITROSTAT) 0.4 MG SL tablet     S/L NTG was prescribed and explained how to and when to use it and to notify us if there is change in frequency of use.. Patient aware of ED visit if needed.

## 2023-05-08 NOTE — Telephone Encounter (Signed)
 Chronic stable angina and CAD of the native vessel with stable angina

## 2023-05-09 ENCOUNTER — Ambulatory Visit: Attending: Internal Medicine | Admitting: *Deleted

## 2023-05-09 ENCOUNTER — Encounter: Payer: Self-pay | Admitting: *Deleted

## 2023-05-09 VITALS — BP 118/80 | Ht 74.0 in | Wt 269.0 lb

## 2023-05-09 DIAGNOSIS — R0609 Other forms of dyspnea: Secondary | ICD-10-CM | POA: Diagnosis not present

## 2023-05-09 DIAGNOSIS — R072 Precordial pain: Secondary | ICD-10-CM | POA: Diagnosis not present

## 2023-05-09 NOTE — Patient Instructions (Signed)
 Marland Kitchen

## 2023-05-09 NOTE — Telephone Encounter (Signed)
 Cath scheduled for April 3 at 1:30.  I spoke with patient and made him aware.  Instructions reviewed with patient.  He will come to office today for nurse room visit EKG and also have lab work done at [facility].  Will send instructions to patient through my chart and also give him a copy when here for EKG today

## 2023-05-09 NOTE — Progress Notes (Signed)
   Nurse Visit   Date of Encounter: 05/09/2023 ID: Paul Freeman, DOB 1975-08-23, MRN [id number]  PCP:  Josepha Nickels, DO   [facility] Providers Cardiologist:  Knox Perl, MD      Visit Details   VS:  BP 118/80 (BP Location: Right Arm, Patient Position: Sitting, Cuff Size: Large)   Ht 6' 2 (1.88 m)   Wt 269 lb (122 kg)   BMI 34.54 kg/m  , BMI Body mass index is 34.54 kg/m.  Wt Readings from Last 3 Encounters:  05/09/23 269 lb (122 kg)  04/24/23 258 lb (117 kg)  03/16/23 264 lb (119.7 kg)     Reason for visit: EKG prior to cath Performed today: Vitals, EKG, Provider consulted:Dr Avanell Bob, and Education Changes (medications, testing, etc.) : no changes Length of Visit: 30 minutes    Medications Adjustments/Labs and Tests Ordered: Orders Placed This Encounter  Procedures   EKG 12-Lead   No orders of the defined types were placed in this encounter.    Signed, Alfonso Ike, RN  05/09/2023 11:50 AM

## 2023-05-10 ENCOUNTER — Telehealth: Payer: Self-pay | Admitting: *Deleted

## 2023-05-10 LAB — BASIC METABOLIC PANEL WITH GFR
BUN/Creatinine Ratio: 12 (ref 9–20)
BUN: 13 mg/dL (ref 6–24)
CO2: 26 mmol/L (ref 20–29)
Calcium: 9.7 mg/dL (ref 8.7–10.2)
Chloride: 99 mmol/L (ref 96–106)
Creatinine, Ser: 1.12 mg/dL (ref 0.76–1.27)
Glucose: 79 mg/dL (ref 70–99)
Potassium: 4.4 mmol/L (ref 3.5–5.2)
Sodium: 140 mmol/L (ref 134–144)
eGFR: 82 mL/min/{1.73_m2} (ref >59)

## 2023-05-10 LAB — CBC
Hematocrit: 48 % (ref 37.5–51.0)
Hemoglobin: 16.3 g/dL (ref 13.0–17.7)
MCH: 30.6 pg (ref 26.6–33.0)
MCHC: 34 g/dL (ref 31.5–35.7)
MCV: 90 fL (ref 79–97)
Platelets: 218 10*3/uL (ref 150–450)
RBC: 5.32 x10E6/uL (ref 4.14–5.80)
RDW: 12.3 % (ref 11.6–15.4)
WBC: 7.8 10*3/uL (ref 3.4–10.8)

## 2023-05-10 NOTE — Telephone Encounter (Signed)
 Cardiac Catheterization scheduled at [facility] for: Thursday May 11, 2023 1:30 PM Arrival time [facility] Main Entrance A at: 11:30 AM  Nothing to eat after midnight prior to procedure, clear liquids until 5 AM day of procedure.  Medication instructions: -Hold:  Lasix/Lisinopril/HCT-AM of procedure -Other usual morning medications can be taken with sips of water including aspirin 81 mg.  Plan to go home the same day, you will only stay overnight if medically necessary.  You must have responsible adult to drive you home.  Someone must be with you the first 24 hours after you arrive home.  Reviewed procedure instructions with patient.

## 2023-05-11 ENCOUNTER — Ambulatory Visit (HOSPITAL_COMMUNITY)
Admission: RE | Admit: 2023-05-11 | Discharge: 2023-05-11 | Disposition: A | Discharge status: Home / Self Care | Attending: Cardiology | Admitting: Cardiology

## 2023-05-11 ENCOUNTER — Other Ambulatory Visit: Payer: Self-pay

## 2023-05-11 ENCOUNTER — Encounter (HOSPITAL_COMMUNITY)
Admission: RE | Disposition: A | Discharge status: Home / Self Care | Payer: Self-pay | Source: Home / Self Care | Attending: Cardiology

## 2023-05-11 ENCOUNTER — Other Ambulatory Visit (HOSPITAL_COMMUNITY): Payer: Self-pay

## 2023-05-11 DIAGNOSIS — E7800 Pure hypercholesterolemia, unspecified: Secondary | ICD-10-CM | POA: Insufficient documentation

## 2023-05-11 DIAGNOSIS — I2510 Atherosclerotic heart disease of native coronary artery without angina pectoris: Secondary | ICD-10-CM | POA: Diagnosis not present

## 2023-05-11 DIAGNOSIS — E291 Testicular hypofunction: Secondary | ICD-10-CM | POA: Diagnosis not present

## 2023-05-11 DIAGNOSIS — R9439 Abnormal result of other cardiovascular function study: Secondary | ICD-10-CM | POA: Diagnosis present

## 2023-05-11 DIAGNOSIS — I2584 Coronary atherosclerosis due to calcified coronary lesion: Secondary | ICD-10-CM | POA: Diagnosis not present

## 2023-05-11 DIAGNOSIS — I25119 Atherosclerotic heart disease of native coronary artery with unspecified angina pectoris: Secondary | ICD-10-CM | POA: Insufficient documentation

## 2023-05-11 DIAGNOSIS — Z955 Presence of coronary angioplasty implant and graft: Secondary | ICD-10-CM

## 2023-05-11 DIAGNOSIS — Z79899 Other long term (current) drug therapy: Secondary | ICD-10-CM | POA: Diagnosis not present

## 2023-05-11 DIAGNOSIS — F419 Anxiety disorder, unspecified: Secondary | ICD-10-CM | POA: Insufficient documentation

## 2023-05-11 DIAGNOSIS — I10 Essential (primary) hypertension: Secondary | ICD-10-CM | POA: Insufficient documentation

## 2023-05-11 HISTORY — PX: LEFT HEART CATH AND CORONARY ANGIOGRAPHY: CATH118249

## 2023-05-11 HISTORY — PX: CORONARY STENT INTERVENTION: CATH118234

## 2023-05-11 LAB — POCT ACTIVATED CLOTTING TIME
Activated Clotting Time: 204 s
Activated Clotting Time: 210 s

## 2023-05-11 SURGERY — LEFT HEART CATH AND CORONARY ANGIOGRAPHY
Anesthesia: LOCAL

## 2023-05-11 MED ORDER — NITROGLYCERIN 1 MG/10 ML FOR IR/CATH LAB
INTRA_ARTERIAL | Status: AC
  Filled 2023-05-11: qty 10

## 2023-05-11 MED ORDER — MIDAZOLAM HCL 2 MG/2ML IJ SOLN
INTRAMUSCULAR | Status: AC
  Filled 2023-05-11: qty 2

## 2023-05-11 MED ORDER — SODIUM CHLORIDE 0.9 % IV SOLN
INTRAVENOUS | Status: DC | PRN
  Administered 2023-05-11: 10 mL/h via INTRAVENOUS

## 2023-05-11 MED ORDER — LIDOCAINE HCL (PF) 1 % IJ SOLN
INTRAMUSCULAR | Status: DC | PRN
  Administered 2023-05-11: 2 mL

## 2023-05-11 MED ORDER — HEPARIN SODIUM (PORCINE) 1000 UNIT/ML IJ SOLN
INTRAMUSCULAR | Status: DC | PRN
  Administered 2023-05-11: 4000 [IU] via INTRAVENOUS
  Administered 2023-05-11: 3000 [IU] via INTRAVENOUS
  Administered 2023-05-11: 5000 [IU] via INTRAVENOUS

## 2023-05-11 MED ORDER — HEPARIN SODIUM (PORCINE) 1000 UNIT/ML IJ SOLN
INTRAMUSCULAR | Status: AC
Start: 2023-05-11 — End: ?
  Filled 2023-05-11: qty 10

## 2023-05-11 MED ORDER — ACETAMINOPHEN 325 MG PO TABS: 650.0000 mg | ORAL_TABLET | ORAL | Status: DC | PRN

## 2023-05-11 MED ORDER — VERAPAMIL HCL 2.5 MG/ML IV SOLN
INTRAVENOUS | Status: AC
  Filled 2023-05-11: qty 2

## 2023-05-11 MED ORDER — ASPIRIN 81 MG PO CHEW: 81.0000 mg | CHEWABLE_TABLET | ORAL | Status: DC

## 2023-05-11 MED ORDER — FENTANYL CITRATE (PF) 100 MCG/2ML IJ SOLN
INTRAMUSCULAR | Status: AC
  Filled 2023-05-11: qty 2

## 2023-05-11 MED ORDER — CLOPIDOGREL BISULFATE 75 MG PO TABS
75.0000 mg | ORAL_TABLET | Freq: Every day | ORAL | 0 refills | Status: DC
Start: 2023-05-11 — End: 2023-06-08
  Filled 2023-05-11: qty 30, 30d supply, fill #0

## 2023-05-11 MED ORDER — LABETALOL HCL 5 MG/ML IV SOLN: 10.0000 mg | INTRAVENOUS | Status: DC | PRN

## 2023-05-11 MED ORDER — PANTOPRAZOLE SODIUM 40 MG PO TBEC: 40.0000 mg | DELAYED_RELEASE_TABLET | Freq: Every day | ORAL | 1 refills | Status: DC | PRN

## 2023-05-11 MED ORDER — SODIUM CHLORIDE 0.9 % IV SOLN: 250.0000 mL | INTRAVENOUS | Status: DC | PRN

## 2023-05-11 MED ORDER — HYDRALAZINE HCL 20 MG/ML IJ SOLN: 5.0000 mg | INTRAMUSCULAR | Status: DC | PRN

## 2023-05-11 MED ORDER — FENTANYL CITRATE (PF) 100 MCG/2ML IJ SOLN
INTRAMUSCULAR | Status: DC | PRN
  Administered 2023-05-11 (×2): 50 ug via INTRAVENOUS

## 2023-05-11 MED ORDER — HEPARIN (PORCINE) IN NACL 1000-0.9 UT/500ML-% IV SOLN
INTRAVENOUS | Status: DC | PRN
  Administered 2023-05-11: 1500 mL

## 2023-05-11 MED ORDER — ONDANSETRON HCL 4 MG/2ML IJ SOLN: 4.0000 mg | Freq: Four times a day (QID) | INTRAMUSCULAR | Status: DC | PRN

## 2023-05-11 MED ORDER — SODIUM CHLORIDE 0.9% FLUSH: 3.0000 mL | INTRAVENOUS | Status: DC | PRN

## 2023-05-11 MED ORDER — ASPIRIN 81 MG PO TBEC
81.0000 mg | DELAYED_RELEASE_TABLET | Freq: Every day | ORAL | 1 refills | Status: DC
  Filled 2023-05-11: qty 90, 90d supply, fill #0
  Filled 2023-06-06: qty 30, 30d supply, fill #0

## 2023-05-11 MED ORDER — SODIUM CHLORIDE 0.9 % WEIGHT BASED INFUSION: 1.0000 mL/kg/h | INTRAVENOUS | Status: DC

## 2023-05-11 MED ORDER — CLOPIDOGREL BISULFATE 300 MG PO TABS
ORAL_TABLET | ORAL | Status: AC
  Filled 2023-05-11: qty 2

## 2023-05-11 MED ORDER — MIDAZOLAM HCL 2 MG/2ML IJ SOLN
INTRAMUSCULAR | Status: DC | PRN
  Administered 2023-05-11 (×3): 2 mg via INTRAVENOUS
  Administered 2023-05-11: 1 mg via INTRAVENOUS

## 2023-05-11 MED ORDER — VERAPAMIL HCL 2.5 MG/ML IV SOLN
INTRAVENOUS | Status: DC | PRN
  Administered 2023-05-11: 10 mL via INTRA_ARTERIAL

## 2023-05-11 MED ORDER — LIDOCAINE HCL (PF) 1 % IJ SOLN
INTRAMUSCULAR | Status: AC
  Filled 2023-05-11: qty 30

## 2023-05-11 MED ORDER — CLOPIDOGREL BISULFATE 300 MG PO TABS
ORAL_TABLET | ORAL | Status: DC | PRN
  Administered 2023-05-11: 600 mg via ORAL

## 2023-05-11 MED ORDER — EZETIMIBE-SIMVASTATIN 10-40 MG PO TABS: 1.0000 | ORAL_TABLET | Freq: Every day | ORAL | 1 refills | Status: DC

## 2023-05-11 MED ORDER — SODIUM CHLORIDE 0.9 % WEIGHT BASED INFUSION: 3.0000 mL/kg/h | INTRAVENOUS | Status: AC

## 2023-05-11 MED ORDER — IOHEXOL 350 MG/ML SOLN
INTRAVENOUS | Status: DC | PRN
Start: 2023-05-11 — End: 2023-05-12
  Administered 2023-05-11: 100 mL

## 2023-05-11 SURGICAL SUPPLY — 13 items
CATH INFINITI AMBI 5FR TG (CATHETERS) IMPLANT
CATH VISTA GUIDE 6FR XBLD 3.5 (CATHETERS) IMPLANT
DEVICE RAD COMP TR BAND LRG (VASCULAR PRODUCTS) IMPLANT
GLIDESHEATH SLEND A-KIT 6F 22G (SHEATH) IMPLANT
GUIDEWIRE INQWIRE 1.5J.035X260 (WIRE) IMPLANT
INQWIRE 1.5J .035X260CM (WIRE) ×1 IMPLANT
KIT ENCORE 26 ADVANTAGE (KITS) IMPLANT
KIT HEMO VALVE WATCHDOG (MISCELLANEOUS) IMPLANT
PACK CARDIAC CATHETERIZATION (CUSTOM PROCEDURE TRAY) ×1 IMPLANT
SET ATX-X65L (MISCELLANEOUS) IMPLANT
STENT SYNERGY XD 4.0X20 (Permanent Stent) IMPLANT
SYNERGY XD 4.0X20 (Permanent Stent) ×1 IMPLANT
WIRE ASAHI PROWATER 180CM (WIRE) IMPLANT

## 2023-05-11 NOTE — Discharge Instructions (Addendum)
 Drink plenty of fluids for 48 hours and keep wrist elevated at heart level for 24 hours  Radial Site Care   This sheet gives you information about how to care for yourself after your procedure. Your health care provider may also give you more specific instructions. If you have problems or questions, contact your health care provider. What can I expect after the procedure? After the procedure, it is common to have: Bruising and tenderness at the catheter insertion area. Follow these instructions at home: Medicines Take over-the-counter and prescription medicines only as told by your health care provider. Insertion site care Follow instructions from your health care provider about how to take care of your insertion site. Make sure you: Wash your hands with soap and water before you change your bandage (dressing). If soap and water are not available, use hand sanitizer. Remove your dressing as told by your health care provider. In 24 hours Check your insertion site every day for signs of infection. Check for: Redness, swelling, or pain. Fluid or blood. Pus or a bad smell. Warmth. Do not take baths, swim, or use a hot tub for 5 days. You may shower 24 hours after the procedure, or as directed by your health care provider. Remove the dressing and gently wash the site with plain soap and water. Pat the area dry with a clean towel. Do not rub the site. That could cause bleeding. Do not apply powder or lotion to the site. Activity   For 24 hours after the procedure, or as directed by your health care provider: Do not flex or bend the affected arm. Do not push or pull heavy objects with the affected arm. Do not drive yourself home from the hospital or clinic. You may drive 24 hours after the procedure unless your health care provider tells you not to. Do not operate machinery or power tools. Do not lift anything that is heavier than 10 lb (4.5 kg)  For 5 days Ask your health care provider  when it is okay to: Return to work or school. Resume usual physical activities or sports. Resume sexual activity. General instructions If the catheter site starts to bleed, raise your arm and put firm pressure on the site. If the bleeding does not stop, get help right away. This is a medical emergency. If you went home on the same day as your procedure, a responsible adult should be with you for the first 24 hours after you arrive home. Keep all follow-up visits as told by your health care provider. This is important. Contact a health care provider if: You have a fever. You have redness, swelling, or yellow drainage around your insertion site. Get help right away if: You have unusual pain at the radial site. The catheter insertion area swells very fast. The insertion area is bleeding, and the bleeding does not stop when you hold steady pressure on the area. Your arm or hand becomes pale, cool, tingly, or numb. These symptoms may represent a serious problem that is an emergency. Do not wait to see if the symptoms will go away. Get medical help right away. Call your local emergency services (911 in the [place] Do not drive yourself to the hospital. Summary After the procedure, it is common to have bruising and tenderness at the site. Follow instructions from your health care provider about how to take care of your radial site wound. Check the wound every day for signs of infection. Do not lift anything that is heavier than  10 lb (4.5 kg), or the limit that you are told, until your health care provider says that it is safe. This information is not intended to replace advice given to you by your health care provider. Make sure you discuss any questions you have with your health care provider. Document Revised: 03/01/2017 Document Reviewed: 03/01/2017 Elsevier Patient Education  2020 [employer].   Information about your medication: Plavix (anti-platelet agent)  Generic Name (Brand):  clopidogrel (Plavix), once daily medication  PURPOSE: You are taking this medication along with aspirin to lower your chance of having a heart attack, stroke, or blood clots in your heart stent. These can be fatal. Plavix and aspirin help prevent platelets from sticking together and forming a clot that can block an artery or your stent.   Common SIDE EFFECTS you may experience include: bruising or bleeding more easily, shortness of breath  Do not stop taking PLAVIX without talking to the doctor who prescribes it for you. People who are treated with a stent and stop taking Plavix too soon, have a higher risk of getting a blood clot in the stent, having a heart attack, or dying. If you stop Plavix because of bleeding, or for other reasons, your risk of a heart attack or stroke may increase.   Avoid taking NSAID agents or anti-inflammatory medications such as ibuprofen, naproxen given increased bleed risk with plavix - can use acetaminophen (Tylenol) if needed for pain.  Avoid taking over the counter stomach medications omeprazole (Prilosec) or esomeprazole (Nexium) since these do interact and make plavix less effective - ask your pharmacist or doctor for alterative agents if needed for heartburn or GERD.   Tell all of your doctors and dentists that you are taking Plavix. They should talk to the doctor who prescribed Plavix for you before you have any surgery or invasive procedure.   Contact your health care provider if you experience: severe or uncontrollable bleeding, pink/red/brown urine, vomiting blood or vomit that looks like "coffee grounds", red or black stools (looks like tar), coughing up blood or blood clots ----------------------------------------------------------------------------------------------------------------------

## 2023-05-11 NOTE — Interval H&P Note (Signed)
 History and Physical Interval Note:  05/11/2023 1:40 PM  Paul Freeman  has presented today for surgery, with the diagnosis of abnormal ct.  The various methods of treatment have been discussed with the patient and family. After consideration of risks, benefits and other options for treatment, the patient has consented to  Procedure(s): LEFT HEART CATH AND CORONARY ANGIOGRAPHY (N/A) CORONARY STENT INTERVENTION (N/A) as a surgical intervention.  The patient's history has been reviewed, patient examined, no change in status, stable for surgery.  I have reviewed the patient's chart and labs.  Questions were answered to the patient's satisfaction.     Yates Decamp

## 2023-05-11 NOTE — H&P (Signed)
 Cardiology Office Note:  .   Date:  05/11/2023  ID:  Paul Freeman, DOB 11-08-1975, MRN [id number] PCP: Charlton Amor, DO  [facility] Providers Cardiologist:  Yates Decamp, MD   History of Present Illness: .   Paul Freeman is a 48 y.o. Caucasian male with hypertension, extreme anxiety, mild  G6PD deficiency, who has responded well to blood pressure control with lisinopril/HCT, HCT utilized in spite of G6PD deficiency at a low dose which he is tolerating for years now. BP without lisinopril/HCT was uncontrolled.  Past medical history significant for coronary calcium score in the 97 percentile in 2023, no significant coronary disease by coronary CT angiogram on 04/07/2021, hypercholesterolemia, hypogonadism presently on testosterone supplement and generalized anxiety disorder.   Patient presented to the emergency room with abdominal discomfort, palpitations at [facility], ruled out for myocardial infarction and discharged home.  He underwent coronary CT angiogram 05/05/2023 due to persistent chest discomfort, found to have high-grade stenosis in the proximal to mid segment of the LAD, in view of persistent discomfort in his chest, he was scheduled for diagnostic and possible coronary intervention in elective manner.  He has no specific complaints, states that he is very concerned about recurrent episodes of chest pain with exertion activity, extremely anxious and wants to proceed with and have stent placed if possible and prefers to avoid CABG.   Labs   Lab Results  Component Value Date   CHOL 159 03/15/2023   HDL 40 03/15/2023   LDLCALC 94 03/15/2023   TRIG 139 03/15/2023   CHOLHDL 4.0 03/15/2023   Lab Results  Component Value Date   NA 140 05/09/2023   K 4.4 05/09/2023   CO2 26 05/09/2023   GLUCOSE 79 05/09/2023   BUN 13 05/09/2023   CREATININE 1.12 05/09/2023   CALCIUM 9.7 05/09/2023   GFR 94.91 04/18/2011   EGFR 82 05/09/2023   GFRNONAA >60 08/16/2022       Latest Ref Rng & Units 05/09/2023   12:01 PM 03/15/2023   12:59 PM 09/02/2022    9:44 AM  BMP  Glucose 70 - 99 mg/dL 79  89  83   BUN 6 - 24 mg/dL 13  11  15    Creatinine 0.76 - 1.27 mg/dL 1.30  8.65  7.84   BUN/Creat Ratio 9 - 20 12  11  13    Sodium 134 - 144 mmol/L 140  142  140   Potassium 3.5 - 5.2 mmol/L 4.4  3.9  4.2   Chloride 96 - 106 mmol/L 99  98  99   CO2 20 - 29 mmol/L 26  25  25    Calcium 8.7 - 10.2 mg/dL 9.7  9.7  9.8       Latest Ref Rng & Units 05/09/2023   12:01 PM 10/13/2022    8:09 AM 08/16/2022   12:30 PM  CBC  WBC 3.4 - 10.8 x10E3/uL 7.8  6.2    Hemoglobin 13.0 - 17.7 g/dL 69.6  29.5  28.4   Hematocrit 37.5 - 51.0 % 48.0  49.3    Platelets 150 - 450 x10E3/uL 218  173     Review of Systems  Cardiovascular:  Positive for chest pain and dyspnea on exertion.  Psychiatric/Behavioral:  Positive for hypervigilance. Negative for suicidal ideas.     Physical Exam:   VS:  BP (!) 122/97   Pulse 85   Temp 98.1 F (36.7 C) (Oral)   Resp (!) 22   Ht  6\' 2"  (1.88 m)   Wt 120.2 kg   SpO2 96%   BMI 34.02 kg/m    Wt Readings from Last 3 Encounters:  05/11/23 120.2 kg  05/09/23 122 kg  04/24/23 117 kg    Physical Exam Neck:     Vascular: No carotid bruit or JVD.  Cardiovascular:     Rate and Rhythm: Normal rate and regular rhythm.     Pulses: Intact distal pulses.     Heart sounds: Normal heart sounds. No murmur heard.    No gallop.  Pulmonary:     Effort: Pulmonary effort is normal.     Breath sounds: Normal breath sounds.  Abdominal:     General: Bowel sounds are normal.     Palpations: Abdomen is soft.  Musculoskeletal:     Right lower leg: No edema.     Left lower leg: No edema.    Studies Reviewed: Marland Kitchen    Coronary CTA 05/05/2023: 1. Coronary calcium score of 156. This was 96th percentile for age-, sex, and race-matched controls. 2. Total plaque volume 378 mm3 which is 84th percentile for age- and sex-matched controls (calcified plaque 38mm3;  non-calcified plaque 376mm3). TPV is severe. 3. Normal coronary origin with right dominance. 4. Moderate atherosclerosis: 50-69% mid LAD 1. Coronary CTA FFR demonstrates a possibly hemodynamically flow limiting lesion in the mid LAD (prox FFR 0.97>>mid FFR 0.78). 2. Recommend Cardiac Cath.   Echocardiogram 05/13/2021:  Normal LV systolic function with visual EF 55-60%. Left ventricle cavity  is normal in size. Moderate left ventricular hypertrophy. Normal global  wall motion. Normal diastolic filling pattern, normal LAP. Calculated EF 57%.  No significant valvular heart disease.   EKG:    Medications and allergies    Allergies  Allergen Reactions   Crestor [Rosuvastatin] Other (See Comments)   Quinolones Other (See Comments)    Secondary to genetic disorder G6PD causes hemotology anemias as stated per pt    Lexapro [Escitalopram] Other (See Comments)    hallucinations   Penicillins Other (See Comments)    UNKNOWN/childhood      Current Facility-Administered Medications:    0.9 %  sodium chloride infusion, , , Continuous PRN, Yates Decamp, MD, Last Rate: 10 mL/hr at 05/11/23 1224, 10 mL/hr at 05/11/23 1224   [EXPIRED] 0.9% sodium chloride infusion, 3 mL/kg/hr, Intravenous, Continuous **FOLLOWED BY** 0.9% sodium chloride infusion, 1 mL/kg/hr, Intravenous, Continuous, Yates Decamp, MD   aspirin chewable tablet 81 mg, 81 mg, Oral, Pre-Cath, Yates Decamp, MD   clopidogrel (PLAVIX) tablet, , , PRN, Yates Decamp, MD, 600 mg at 05/11/23 1247   fentaNYL (SUBLIMAZE) injection, , , PRN, Yates Decamp, MD, 50 mcg at 05/11/23 1223   Heparin (Porcine) in NaCl 1000-0.9 UT/500ML-% SOLN, , , PRN, Yates Decamp, MD, 1,500 mL at 05/11/23 1247   heparin sodium (porcine) injection, , , PRN, Yates Decamp, MD, 3,000 Units at 05/11/23 1255   iohexol (OMNIPAQUE) 350 MG/ML injection, , , PRN, Yates Decamp, MD, 100 mL at 05/11/23 1304   lidocaine (PF) (XYLOCAINE) 1 % injection, , , PRN, Yates Decamp, MD, 2 mL at 05/11/23  1227   midazolam (VERSED) injection, , , PRN, Yates Decamp, MD, 1 mg at 05/11/23 1235   Radial Cocktail/Verapamil only, , , PRN, Yates Decamp, MD, 10 mL at 05/11/23 1228   ASSESSMENT AND PLAN: .     1.  Coronary artery disease of native vessel with angina pectoris 2.  Primary hypertension 3.  Hypercholesterolemia  Discussed with the patient  extensively regarding proceeding with cardiac catheterization, he prefers to proceed with cardiac catheterization and he has doubled his statin dose and is presently on a beta-blocker as well.  He needs aggressive risk factor modification, he has concerns about statin therapy and "believes issues with statins".  I have discussed with him regarding primary/secondary prevention extensively.  He appears to be amenable.  Schedule for cardiac catheterization, and possible angioplasty. We discussed regarding risks, benefits, alternatives to this including stress testing, CTA and continued medical therapy. Patient wants to proceed. Understands <1-2% risk of death, stroke, MI, urgent CABG, bleeding, infection, renal failure but not limited to these.  Signed,  Yates Decamp, MD, [facility] 05/11/2023 [facility] [address] [place], [place] [address] Phone: [phone]. Fax:  [phone]

## 2023-05-11 NOTE — Progress Notes (Addendum)
 Pt was educated on stent card, stent location, Antiplatelet and ASA use, wt restrictions, no baths/daily wash-ups, s/s of infection, ex guidelines, s/s to stop exercising, NTG use and calling 911, heart healthy diet, risk factors and CRPII. Pt received materials on exercise, diet, and CRPII. Will refer to [facility].    Faustino Congress MS, ACSM-CEP  05/11/2023 2:56 PM  [phone]

## 2023-05-12 ENCOUNTER — Encounter (HOSPITAL_COMMUNITY): Payer: Self-pay | Admitting: Cardiology

## 2023-05-15 ENCOUNTER — Telehealth (HOSPITAL_COMMUNITY): Payer: Self-pay | Admitting: *Deleted

## 2023-05-15 NOTE — Telephone Encounter (Signed)
 Cardiac Rehab Phase II referral faxed to [facility] per pt request. Paul Freeman [facility], ACSM-CEP 05/15/2023 1:58 PM

## 2023-05-22 NOTE — Telephone Encounter (Signed)
 Left voice message to call back 4/14

## 2023-06-06 ENCOUNTER — Other Ambulatory Visit: Payer: Self-pay

## 2023-06-06 ENCOUNTER — Other Ambulatory Visit: Payer: Self-pay | Admitting: Cardiology

## 2023-06-07 ENCOUNTER — Encounter (HOSPITAL_COMMUNITY): Payer: Self-pay

## 2023-06-08 ENCOUNTER — Other Ambulatory Visit: Payer: Self-pay | Admitting: *Deleted

## 2023-06-08 MED ORDER — CLOPIDOGREL BISULFATE 75 MG PO TABS: 75.0000 mg | ORAL_TABLET | Freq: Every day | ORAL | 3 refills | Status: AC

## 2023-06-08 NOTE — Progress Notes (Signed)
 Plavix  refilled to [facility] pharmacy

## 2023-06-12 ENCOUNTER — Encounter: Payer: Self-pay | Admitting: Cardiology

## 2023-06-12 ENCOUNTER — Ambulatory Visit: Attending: Cardiology | Admitting: Cardiology

## 2023-06-12 VITALS — BP 128/83 | HR 79 | Resp 16 | Ht 74.0 in | Wt 266.0 lb

## 2023-06-12 DIAGNOSIS — I10 Essential (primary) hypertension: Secondary | ICD-10-CM

## 2023-06-12 DIAGNOSIS — I25118 Atherosclerotic heart disease of native coronary artery with other forms of angina pectoris: Secondary | ICD-10-CM

## 2023-06-12 DIAGNOSIS — Z955 Presence of coronary angioplasty implant and graft: Secondary | ICD-10-CM | POA: Diagnosis not present

## 2023-06-12 DIAGNOSIS — E7800 Pure hypercholesterolemia, unspecified: Secondary | ICD-10-CM

## 2023-06-12 NOTE — Patient Instructions (Signed)
 Medication Instructions:  Your physician recommends that you continue on your current medications as directed. Please refer to the Current Medication list given to you today.  *If you need a refill on your cardiac medications before your next appointment, please call your pharmacy*  Lab Work: In 1 month: fasting Lipid panel If you have labs (blood work) drawn today and your tests are completely normal, you will receive your results only by: MyChart Message (if you have MyChart) OR A paper copy in the mail If you have any lab test that is abnormal or we need to change your treatment, we will call you to review the results.  Follow-Up: At [facility], you and your health needs are our priority.  As part of our continuing mission to provide you with exceptional heart care, our providers are all part of one team.  This team includes your primary Cardiologist (physician) and Advanced Practice Providers or APPs (Physician Assistants and Nurse Practitioners) who all work together to provide you with the care you need, when you need it.  Your next appointment:   6 month(s)  The format for your next appointment:   In Person  Provider:   Knox Perl, MD{  We recommend signing up for the patient portal called "MyChart".  Sign up information is provided on this After Visit Summary.  MyChart is used to connect with patients for Virtual Visits (Telemedicine).  Patients are able to view lab/test results, encounter notes, upcoming appointments, etc.  Non-urgent messages can be sent to your provider as well.   To learn more about what you can do with MyChart, go to [email or web address].

## 2023-06-12 NOTE — Progress Notes (Signed)
 Cardiology Office Note:  .   Date:  06/12/2023  ID:  Paul Freeman, DOB 02-14-1975, MRN [id number] PCP: Josepha Nickels, DO  [facility] Providers Cardiologist:  Knox Perl, MD   History of Present Illness: .   Paul Freeman is a 48 y.o. Caucasian male with hypertension, extreme anxiety, mild G6PD deficiency, who has responded well to blood pressure control with lisinopril /HCT, HCT utilized in spite of G6PD deficiency at a low dose, hypercholesterolemia, anxiety and stress, coronary calcium  score in the 97 percentile in 2023 underwent coronary CT angiogram on 05/05/2023 revealing a hemodynamically significant mid LAD to proximal LAD stenosis and recommended cardiac catheterization.  Cardiac catheterization on 05/11/2023 revealed a proximal to mid LAD 80% stenosis for which he underwent successful stenting with a 4.0 x 20 mm Synergy XD DES.  He now presents for follow-up.  Discussed the use of AI scribe software for clinical note transcription with the patient, who gave verbal consent to proceed.  History of Present Illness Paul Freeman is a 47 year old male with coronary artery disease who presents for follow-up after stent placement. He underwent stent placement in the left anterior descending artery for a high-grade lesion. He experiences occasional, manageable chest pain and has nitroglycerin  available but has not used it recently. He has high cholesterol with low HDL and high LDL levels, managed with simvastatin  and azithromycin . He experiences occasional muscle soreness from the medication and takes breaks to manage this side effect.  He has a history of smoking for over 20 years and has made lifestyle changes, including dietary modifications and increased physical activity, resulting in weight reduction. He experiences high stress levels, previously sought counseling, but has not implemented stress management techniques. He acknowledges the impact of stress on his family and desires  change.  He is taking Plavix  with 11 refills available, planning to continue for a year. He also takes aspirin  (81 mg daily), lisinopril , metoprolol , and amlodipine  for blood pressure management. Furosemide is available but unused for four months which she uses for occasional leg edema especially when there is excess salt intake in his diet.  Labs   Lab Results  Component Value Date   CHOL 159 03/15/2023   HDL 40 03/15/2023   LDLCALC 94 03/15/2023   TRIG 139 03/15/2023   CHOLHDL 4.0 03/15/2023   Lab Results  Component Value Date   NA 140 05/09/2023   K 4.4 05/09/2023   CO2 26 05/09/2023   GLUCOSE 79 05/09/2023   BUN 13 05/09/2023   CREATININE 1.12 05/09/2023   CALCIUM  9.7 05/09/2023   GFR 94.91 04/18/2011   EGFR 82 05/09/2023   GFRNONAA >60 08/16/2022      Latest Ref Rng & Units 05/09/2023   12:01 PM 03/15/2023   12:59 PM 09/02/2022    9:44 AM  BMP  Glucose 70 - 99 mg/dL 79  89  83   BUN 6 - 24 mg/dL 13  11  15    Creatinine 0.76 - 1.27 mg/dL 1.91  4.78  2.95   BUN/Creat Ratio 9 - 20 12  11  13    Sodium 134 - 144 mmol/L 140  142  140   Potassium 3.5 - 5.2 mmol/L 4.4  3.9  4.2   Chloride 96 - 106 mmol/L 99  98  99   CO2 20 - 29 mmol/L 26  25  25    Calcium  8.7 - 10.2 mg/dL 9.7  9.7  9.8  Latest Ref Rng & Units 05/09/2023   12:01 PM 10/13/2022    8:09 AM 08/16/2022   12:30 PM  CBC  WBC 3.4 - 10.8 x10E3/uL 7.8  6.2    Hemoglobin 13.0 - 17.7 g/dL 09.8  11.9  14.7   Hematocrit 37.5 - 51.0 % 48.0  49.3    Platelets 150 - 450 x10E3/uL 218  173     Lab Results  Component Value Date   HGBA1C 4.9 03/08/2021    Lab Results  Component Value Date   TSH 0.52 07/09/2021    ROS  Review of Systems  Cardiovascular:  Negative for chest pain, dyspnea on exertion and leg swelling.    Physical Exam:   VS:  BP 128/83 (BP Location: Left Arm, Patient Position: Sitting, Cuff Size: Large)   Pulse 79   Resp 16   Ht 6\' 2"  (1.88 m)   Wt 266 lb (120.7 kg)   SpO2 96%   BMI 34.15  kg/m    Wt Readings from Last 3 Encounters:  06/12/23 266 lb (120.7 kg)  05/11/23 265 lb (120.2 kg)  05/09/23 269 lb (122 kg)    Physical Exam Neck:     Vascular: No carotid bruit or JVD.  Cardiovascular:     Rate and Rhythm: Normal rate and regular rhythm.     Pulses: Intact distal pulses.     Heart sounds: Normal heart sounds. No murmur heard.    No gallop.  Pulmonary:     Effort: Pulmonary effort is normal.     Breath sounds: Normal breath sounds.  Abdominal:     General: Bowel sounds are normal.     Palpations: Abdomen is soft.  Musculoskeletal:     Right lower leg: No edema.     Left lower leg: No edema.    Studies Reviewed: Aaron Aas    Coronary CT angiogram 05/05/2023: Coronary calcium  score of 156, 96 percentile for age. Total plaque volume 373, mostly noncalcified plaque, 84th percentile for age.  DPV is severe. Right dominant circulation.  Mid LAD 50 to 69% stenosis.  FFR 0.78.  Distal LAD FFR 0.70.  CT FFR demonstrated possibly hemodynamically flow-limiting lesion in the mid LAD. Recommend cardiac catheterization.  CARDIAC CATHETERIZATION 05/11/2023  Mid LAD with implantation of a 4.0 x 20 mm Synergy XD DES, 80% stenosis reduced to 0%     EKG:    EKG Interpretation Date/Time:  Monday Jun 12 2023 12:08:08 EDT Ventricular Rate:  77 PR Interval:  160 QRS Duration:  102 QT Interval:  368 QTC Calculation: 416 R Axis:   30  Text Interpretation: EKG 06/12/2023: Normal sinus rhythm at rate of 77 bpm, normal EKG.  No change from 05/11/2023. Confirmed by Jaydi Bray, Jagadeesh ([id number]) on 06/12/2023 12:13:54 PM    Medications and allergies    Allergies  Allergen Reactions   Crestor  [Rosuvastatin ] Other (See Comments)   Quinolones Other (See Comments)    Secondary to genetic disorder G6PD causes hemotology anemias as stated per pt    Lexapro  [Escitalopram ] Other (See Comments)    hallucinations   Penicillins Other (See Comments)    UNKNOWN/childhood     Current Outpatient  Medications:    amLODipine  (NORVASC ) 10 MG tablet, Take 1 tablet (10 mg total) by mouth daily., Disp: 90 tablet, Rfl: 3   anastrozole (ARIMIDEX) 1 MG tablet, Take 1 mg by mouth 2 (two) times a week., Disp: , Rfl:    aspirin  EC 81 MG tablet, Take 1 tablet (81 mg  total) by mouth daily. Swallow whole., Disp: 90 tablet, Rfl: 1   clopidogrel  (PLAVIX ) 75 MG tablet, Take 1 tablet (75 mg total) by mouth daily., Disp: 90 tablet, Rfl: 3   Coenzyme Q10 (COQ10 PO), Take by mouth daily., Disp: , Rfl:    ezetimibe -simvastatin  (VYTORIN ) 10-40 MG tablet, Take 1 tablet by mouth at bedtime., Disp: 90 tablet, Rfl: 1   furosemide (LASIX) 40 MG tablet, Take 40 mg by mouth 3 (three) times a week., Disp: , Rfl:    metoprolol  tartrate (LOPRESSOR ) 100 MG tablet, Take 1 tablet (100 mg total) by mouth 2 (two) times daily., Disp: 180 tablet, Rfl: 0   pantoprazole  (PROTONIX ) 40 MG tablet, Take 1 tablet (40 mg total) by mouth daily as needed (Heart burn)., Disp: 90 tablet, Rfl: 1   testosterone enanthate (DELATESTRYL) 200 MG/ML injection, Inject into the muscle once a week. For IM use only, Disp: , Rfl:    lisinopril -hydrochlorothiazide  (ZESTORETIC ) 20-12.5 MG tablet, Take 1 tablet by mouth every morning. (Patient not taking: Reported on 06/12/2023), Disp: 90 tablet, Rfl: 0   nitroGLYCERIN  (NITROSTAT ) 0.4 MG SL tablet, Place 1 tablet (0.4 mg total) under the tongue every 5 (five) minutes as needed. (Patient not taking: Reported on 06/12/2023), Disp: 25 tablet, Rfl: 1   No orders of the defined types were placed in this encounter.   There are no discontinued medications.   ASSESSMENT AND PLAN: .      ICD-10-CM   1. Coronary artery disease of native artery of native heart with stable angina pectoris (HCC)  I25.118     2. S/P coronary artery stent placement  Z95.5 EKG 12-Lead    3. Primary hypertension  I10     4. Hypercholesteremia  E78.00      Assessment & Plan Coronary artery disease with stent placement   A  high-grade lesion in the medial LAD with 90% stenosis was successfully treated with a 4 mm stent on 05/11/2023. The stent is permanent, well-expanded, and there are no other blockages. The stent's role in preventing future cardiac events was emphasized, along with the importance of medication adherence and lifestyle changes. Continue Plavix  for one year and aspirin  81 mg daily, stopping after six months.  Hypertension   Blood pressure is well-controlled at 128/83 mmHg with the current medication regimen. Continued medication adherence is crucial to maintain control. Continue amlodipine  10 mg daily, lisinopril  HCT 20/12.5 mg daily, and metoprolol  as prescribed.  Hyperlipidemia   LDL is elevated and HDL cholesterol levels are low. He is on simvastatin  and azithromycin  combination therapy and reports muscle soreness. Potential statin regimen adjustments were discussed to reduce side effects, including intermittent dosing or injectable alternatives if cholesterol levels remain high. Dietary changes and stress management were emphasized. Continue simvastatin  and azithromycin  combination at bedtime. Check cholesterol levels in four weeks. Consider intermittent statin dosing to manage side effects and discuss potential use of Repatha if cholesterol remains high.  Stress-related issues   High stress levels are impacting his health. He acknowledges the need for stress management but struggles with implementation. The role of stress in cardiovascular health and the importance of lifestyle changes were discussed. Professional help and tangible changes to reduce stressors were encouraged. Encourage stress management techniques, such as relaxation exercises. Consider professional counseling or therapy and discuss potential lifestyle changes, including work-related adjustments.    Signed,  Knox Perl, MD, [facility] 06/12/2023, 12:25 PM [facility] [address] [place], [place] [address] Phone: [phone].  Fax:  [phone]

## 2023-06-15 ENCOUNTER — Encounter: Payer: Self-pay | Admitting: Family Medicine

## 2023-06-19 ENCOUNTER — Other Ambulatory Visit: Payer: Self-pay

## 2023-07-07 ENCOUNTER — Encounter: Payer: Self-pay | Admitting: Cardiology

## 2023-07-10 ENCOUNTER — Other Ambulatory Visit: Payer: Self-pay | Admitting: Cardiology

## 2023-07-10 DIAGNOSIS — I10 Essential (primary) hypertension: Secondary | ICD-10-CM

## 2023-07-19 ENCOUNTER — Other Ambulatory Visit: Payer: Self-pay

## 2023-07-19 DIAGNOSIS — I10 Essential (primary) hypertension: Secondary | ICD-10-CM

## 2023-07-19 LAB — LIPID PANEL
Chol/HDL Ratio: 3.2 ratio (ref 0.0–5.0)
Cholesterol, Total: 123 mg/dL (ref 100–199)
HDL: 38 mg/dL — ABNORMAL LOW
LDL Chol Calc (NIH): 61 mg/dL (ref 0–99)
Triglycerides: 139 mg/dL (ref 0–149)
VLDL Cholesterol Cal: 24 mg/dL (ref 5–40)

## 2023-07-19 MED ORDER — METOPROLOL TARTRATE 100 MG PO TABS: 100.0000 mg | ORAL_TABLET | Freq: Two times a day (BID) | ORAL | 3 refills | Status: AC

## 2023-07-20 ENCOUNTER — Ambulatory Visit: Payer: Self-pay | Admitting: Cardiology

## 2023-07-20 NOTE — Progress Notes (Signed)
 Lipids under excellent control. Not to stop statins

## 2023-07-26 ENCOUNTER — Encounter: Payer: Self-pay | Admitting: Urgent Care

## 2023-07-26 ENCOUNTER — Ambulatory Visit: Admitting: Urgent Care

## 2023-07-26 VITALS — BP 115/79 | HR 76 | Resp 18 | Ht 74.0 in | Wt 271.8 lb

## 2023-07-26 DIAGNOSIS — R140 Abdominal distension (gaseous): Secondary | ICD-10-CM | POA: Diagnosis not present

## 2023-07-26 DIAGNOSIS — L821 Other seborrheic keratosis: Secondary | ICD-10-CM

## 2023-07-26 DIAGNOSIS — R143 Flatulence: Secondary | ICD-10-CM

## 2023-07-26 DIAGNOSIS — R161 Splenomegaly, not elsewhere classified: Secondary | ICD-10-CM | POA: Diagnosis not present

## 2023-07-26 DIAGNOSIS — R142 Eructation: Secondary | ICD-10-CM

## 2023-07-26 DIAGNOSIS — R141 Gas pain: Secondary | ICD-10-CM

## 2023-07-26 DIAGNOSIS — F411 Generalized anxiety disorder: Secondary | ICD-10-CM

## 2023-07-26 MED ORDER — DULOXETINE HCL 30 MG PO CPEP: 30.0000 mg | ORAL_CAPSULE | Freq: Every day | ORAL | 3 refills | Status: DC

## 2023-07-26 NOTE — Patient Instructions (Addendum)
 Please STOP taking your pantoprazole  for one week. Do not use any OTC meds for reflux either.  Return in one week for an H pylori test - you need to complete this FASTING. We will also obtain a stool test.  Start duloxetine, one daily. Follow up in one month.

## 2023-07-26 NOTE — Progress Notes (Signed)
 Established Patient Office Visit  Subjective:  Patient ID: Paul Freeman, male    DOB: 08-13-1975  Age: 48 y.o. MRN: [id number]  Chief Complaint  Patient presents with   Transitions Of Care    Pt states he has a spot on the right side of his head and also has constant bloating    HPI  Discussed the use of AI scribe software for clinical note transcription with the patient, who gave verbal consent to proceed.  History of Present Illness   Paul Freeman is a 48 year old male who presents with abdominal bloating and a scalp lesion.  He experiences constant abdominal bloating, described as a 'bleh' feeling, even after consuming small amounts of water. The bloating is primarily in the upper abdomen, more pronounced on the left side, and has persisted for about a year. He recalls being informed of mild splenomegaly after a previous evaluation, but extensive testing, including JAK2 and other blood tests, returned normal results. He has undergone a sonogram and multiple blood tests, all reportedly normal. He has a history of seeing oncology for these symptoms, with no significant findings reported. He takes pantoprazole  daily, switched from Nexium after a stent placement a month ago, and notes that stopping pantoprazole  leads to reflux symptoms. Gas-X does not alleviate his symptoms. He did have an EGD at [facility] in late 2024 which was reportedly normal.   His bowel movements are generally normal, with occasional loose stools but no diarrhea or constipation. He sometimes notices lighter stool color and floating stools, but they do not consistently appear clay-colored or oily. He reports gassy pain and frequent burping, especially in the morning.  He has a scalp lesion present for about six months, described as annoying and cosmetically concerning to his wife. He has not sought dermatological evaluation for this lesion.  He mentions a family history of bloating and gas issues, noting that  his 74 year old son experiences similar symptoms, particularly after physical activity.  He has a history of anxiety and has previously tried medications such as Trintellix , Buspar , and Lexapro , but found them ineffective or had adverse effects. He is not currently taking any medication for anxiety due to insurance issues and concerns about weight gain. 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   Cervical disc disease 02/24/2019   Elevated coronary artery calcium  score 02/24/2019   History of hyperthyroidism 02/24/2019   Numbness of face 01/02/2019   Situational stress 09/19/2018   Neck pain 09/19/2018   G6PD deficiency Mild 09/19/2018   Non-restorative sleep 01/03/2018   Other chronic pain 01/03/2018   Retrognathia 01/03/2018   Loud snoring 01/03/2018   Myalgia 01/03/2018   Chronic fatigue 01/03/2018   Hypertension 06/23/2017   Anxiety    Depression    Past Medical History:  Diagnosis Date   Allergy    Possible Penicillin, Pollen   Anal fissure    Anxiety    Arthritis    Depression    G6P deficiency (glucose-6-phosphatase deficiency) (HCC)    Hx of adenomatous polyp of colon 09/13/2021   Hypertension    Mild hyperlipidemia  Pneumonia    Past Surgical History:  Procedure Laterality Date   APPENDECTOMY     CORONARY STENT INTERVENTION N/A 05/11/2023   Procedure: CORONARY STENT INTERVENTION;  Surgeon: Knox Perl, MD;  Location: [facility];  Service: Cardiovascular;  Laterality: N/A;   CYSTECTOMY  2008   cyst removal from left armpit   LEFT HEART CATH AND CORONARY ANGIOGRAPHY N/A 05/11/2023   Procedure: LEFT  HEART CATH AND CORONARY ANGIOGRAPHY;  Surgeon: Knox Perl, MD;  Location: [facility];  Service: Cardiovascular;  Laterality: N/A;   Social History   Tobacco Use   Smoking status: Former    Current packs/day: 0.00    Average packs/day: 1.5 packs/day for 15.0 years (22.5 ttl pk-yrs)    Types: Cigarettes    Start date: 02/08/1996    Quit date: 02/08/2011    Years since quitting: 12.4   Smokeless tobacco: Never  Vaping Use   Vaping status: Never Used  Substance Use Topics   Alcohol use: Not Currently    Comment: Social once per quarter drinking.   Drug use: No      ROS: as noted in HPI  Objective:     BP 115/79 (BP Location: Left Arm, Patient Position: Sitting, Cuff Size: Large)   Pulse 76   Resp 18   Ht 6' 2 (1.88 m)   Wt 271 lb 12 oz (123.3 kg)   SpO2 96%   BMI 34.89 kg/m  BP Readings from Last 3 Encounters:  07/26/23 115/79  06/12/23 128/83  05/11/23 (!) 121/59   Wt Readings from Last 3 Encounters:  07/26/23 271 lb 12 oz (123.3 kg)  06/12/23 266 lb (120.7 kg)  05/11/23 265 lb (120.2 kg)      Physical Exam Vitals and nursing note reviewed.  Constitutional:      General: He is not in acute distress.    Appearance: Normal appearance. He is not ill-appearing, toxic-appearing or diaphoretic.  HENT:     Head: Normocephalic and atraumatic.     Right Ear: Tympanic membrane, ear canal and external ear normal. There is no impacted cerumen.     Left Ear: Tympanic membrane, ear canal and external ear normal. There is no impacted cerumen.     Nose: Nose normal.     Mouth/Throat:     Mouth: Mucous membranes are moist.     Pharynx: Oropharynx is clear. No oropharyngeal exudate or posterior oropharyngeal erythema.   Eyes:     General: No scleral icterus.       Right eye: No discharge.        Left eye: No discharge.     Extraocular Movements: Extraocular movements intact.     Pupils: Pupils are equal, round, and reactive to light.    Cardiovascular:     Rate  and Rhythm: Normal rate and regular rhythm.     Pulses: Normal pulses.     Heart sounds: No murmur heard. Pulmonary:     Effort: Pulmonary effort is normal. No respiratory distress.     Breath sounds: Normal breath sounds. No stridor. No wheezing or rhonchi.  Abdominal:     General: Abdomen is flat. Bowel sounds are normal. There is no distension.     Palpations: Abdomen is soft. There is no mass.     Tenderness: There is no abdominal tenderness. There is no guarding.     Comments: Percussion tympanic throughout   Musculoskeletal:     Cervical back: Normal range of motion and neck  supple. No rigidity or tenderness.     Right lower leg: No edema.     Left lower leg: No edema.  Lymphadenopathy:     Cervical: No cervical adenopathy.   Skin:    General: Skin is warm and dry.     Coloration: Skin is not jaundiced.     Findings: Lesion (single seborrheic keratosis to R parietal region scalp) present. No bruising, erythema or rash.   Neurological:     General: No focal deficit present.     Mental Status: He is alert and oriented to person, place, and time.     Sensory: No sensory deficit.     Motor: No weakness.   Psychiatric:        Mood and Affect: Mood normal.        Behavior: Behavior normal.      No results found for any visits on 07/26/23.  Last CBC Lab Results  Component Value Date   WBC 7.8 05/09/2023   HGB 16.3 05/09/2023   HCT 48.0 05/09/2023   MCV 90 05/09/2023   MCH 30.6 05/09/2023   RDW 12.3 05/09/2023   PLT 218 05/09/2023   Last metabolic panel Lab Results  Component Value Date   GLUCOSE 79 05/09/2023   NA 140 05/09/2023   K 4.4 05/09/2023   CL 99 05/09/2023   CO2 26 05/09/2023   BUN 13 05/09/2023   CREATININE 1.12 05/09/2023   EGFR 82 05/09/2023   CALCIUM  9.7 05/09/2023   PROT 7.0 09/02/2022   ALBUMIN 4.8 09/02/2022   LABGLOB 2.2 09/02/2022   AGRATIO 2.2 09/28/2021   BILITOT 1.4 (H) 09/02/2022   ALKPHOS 74 09/02/2022   AST 25 09/02/2022    ALT 29 09/02/2022   ANIONGAP 7 08/16/2022   Last lipids Lab Results  Component Value Date   CHOL 123 07/19/2023   HDL 38 (L) 07/19/2023   LDLCALC 61 07/19/2023   TRIG 139 07/19/2023   CHOLHDL 3.2 07/19/2023   Last hemoglobin A1c Lab Results  Component Value Date   HGBA1C 4.9 03/08/2021   Last thyroid  functions Lab Results  Component Value Date   TSH 0.52 07/09/2021   Last vitamin D  Lab Results  Component Value Date   VD25OH 75 05/16/2022   Last vitamin B12 and Folate Lab Results  Component Value Date   VITAMINB12 >2,000 (H) 05/16/2022   FOLATE 9.3 07/09/2021      The ASCVD Risk score (Arnett DK, et al., 2019) failed to calculate for the following reasons:   The valid total cholesterol range is 130 to 320 mg/dL  Assessment & Plan:  Abdominal bloating -     H. pylori breath test -     Calprotectin, Fecal  Splenomegaly  Seborrheic keratosis of scalp  Flatulence, eructation and gas pain -     H. pylori breath test -     Calprotectin, Fecal  GAD (generalized anxiety disorder) -     DULoxetine HCl; Take 1 capsule (30 mg total) by mouth daily.  Dispense: 30 capsule; Refill: 3  Assessment and Plan    Abdominal Bloating Chronic bloating with mild splenomegaly. Differential includes H. pylori and SIBO. Also consider EPI particularly with concerns of steatorrhea. H. pylori breath test preferred, requires pantoprazole  discontinuation. - Discontinue pantoprazole  for one week. - Schedule H. pylori breath test post-discontinuation. If negative, consider SIBO workup - Order fecal calprotectin test. - Instruct to avoid antacids before test. - Advise lifestyle modifications: eat slower, elevate head of bed to prevent  air gulping.  Anxiety Previous medications ineffective or had adverse effects. Considering SNRI class. Genetic testing for medication efficacy discussed if treatment failure is noted. - Prescribe SNRI for anxiety - trial of Cymbalta - Consider genetic  testing if ineffective after 30 days.  Seborrheic Keratosis Benign lesion on scalp, cosmetic concern. Removal may scar, it is hidden in the hair on R scalp. - Discuss cosmetic nature and no further intervention indicated.        Return in about 4 weeks (around 08/23/2023).   Mandy Second, PA

## 2023-08-03 ENCOUNTER — Other Ambulatory Visit: Payer: Self-pay

## 2023-08-03 DIAGNOSIS — R140 Abdominal distension (gaseous): Secondary | ICD-10-CM

## 2023-08-03 DIAGNOSIS — R161 Splenomegaly, not elsewhere classified: Secondary | ICD-10-CM

## 2023-08-05 LAB — H. PYLORI BREATH TEST: H pylori Breath Test: NEGATIVE

## 2023-08-06 ENCOUNTER — Ambulatory Visit: Payer: Self-pay | Admitting: Urgent Care

## 2023-08-06 DIAGNOSIS — N429 Disorder of prostate, unspecified: Secondary | ICD-10-CM

## 2023-08-06 DIAGNOSIS — R1012 Left upper quadrant pain: Secondary | ICD-10-CM

## 2023-08-17 LAB — CALPROTECTIN, FECAL: Calprotectin, Fecal: <5 ug/g (ref 0–120)

## 2023-08-17 LAB — SPECIMEN STATUS REPORT

## 2023-09-01 ENCOUNTER — Encounter: Payer: Self-pay | Admitting: Urgent Care

## 2023-09-01 ENCOUNTER — Ambulatory Visit (INDEPENDENT_AMBULATORY_CARE_PROVIDER_SITE_OTHER): Admitting: Urgent Care

## 2023-09-01 VITALS — BP 131/80 | HR 72 | Resp 18 | Ht 74.0 in | Wt 270.0 lb

## 2023-09-01 DIAGNOSIS — Z1159 Encounter for screening for other viral diseases: Secondary | ICD-10-CM

## 2023-09-01 DIAGNOSIS — L821 Other seborrheic keratosis: Secondary | ICD-10-CM

## 2023-09-01 DIAGNOSIS — Z114 Encounter for screening for human immunodeficiency virus [HIV]: Secondary | ICD-10-CM | POA: Diagnosis not present

## 2023-09-01 DIAGNOSIS — Z0000 Encounter for general adult medical examination without abnormal findings: Secondary | ICD-10-CM | POA: Diagnosis not present

## 2023-09-01 DIAGNOSIS — R935 Abnormal findings on diagnostic imaging of other abdominal regions, including retroperitoneum: Secondary | ICD-10-CM

## 2023-09-01 DIAGNOSIS — Z1283 Encounter for screening for malignant neoplasm of skin: Secondary | ICD-10-CM

## 2023-09-01 NOTE — Progress Notes (Signed)
 Complete physical exam  Patient: Paul Freeman   DOB: 03/01/1975   48 y.o. Male  MRN: [id number]  Subjective:    Chief Complaint  Patient presents with   Annual Exam    Pt would also like a dermatology referral the spot he had in his head is still there and now one has come up on the other side. Concerned it could be melanoma    Paul Freeman is a 48 y.o. male who presents today for a complete physical exam. He reports consuming a general diet. The patient does not participate in regular exercise at present. He generally feels fairly well. He reports sleeping poorly. He does have additional problems to discuss today.   Discussed the use of AI scribe software for clinical note transcription with the patient, who gave verbal consent to proceed.  History of Present Illness   Paul Freeman is a 48 year old male with a history of stent placement who presents for an annual physical exam.  He is on 'double statins' due to a history of stent placement. His last lipid panel six weeks ago showed a total cholesterol of 129 and an LDL of 61. A CBC and BMP in February were normal.  He has a history of an enlarged spleen, previously evaluated by an oncologist with various tests including hepatitis and HIV screenings. The most recent imaging in October showed the spleen was no longer enlarged. He experiences constant bloating, with a negative H. pylori test and stool test previously. No urinary symptoms.  He has a painful nodule on his rib cage when stressed or exerting himself. He was previously on Cymbalta  but stopped due to concerns about weight gain. He avoids fast and fried foods and reports minimal physical activity, having previously engaged in kickboxing.  He has a history of sleep studies, with the last one in 2020 indicating no need for CPAP. He feels tired regardless of sleep duration, a condition shared with his brother. He has not tried melatonin supplements.  He experiences  high-pitched ringing in his ears for the past six months. He also has two spots on his head that feel like scabs without a cut.  He experiences occasional dull chest pain over the last three weeks, not radiating, and occurring more when stressed. He is on pantoprazole  and has nitroglycerin  from his cardiologist but does not use it. He is also on testosterone therapy monitored by a PA.       Most recent fall risk assessment:    03/16/2023   10:55 AM  Fall Risk   Falls in the past year? 0  Number falls in past yr: 0  Injury with Fall? 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   Cervical disc disease 02/24/2019   Elevated coronary artery calcium  score  02/24/2019   History of hyperthyroidism 02/24/2019   Numbness of face 01/02/2019   Situational stress 09/19/2018   Neck pain 09/19/2018   G6PD deficiency Mild 09/19/2018   Non-restorative sleep 01/03/2018   Other chronic pain 01/03/2018   Retrognathia 01/03/2018   Loud snoring 01/03/2018   Myalgia 01/03/2018   Chronic fatigue 01/03/2018   Hypertension 06/23/2017   Anxiety    Depression    Past Medical History:  Diagnosis Date   Allergy    Possible Penicillin, Pollen   Anal fissure    Anxiety    Arthritis    Depression    G6P deficiency (glucose-6-phosphatase deficiency) (HCC)    Hx of adenomatous polyp of colon 09/13/2021   Hypertension    Mild  hyperlipidemia    Pneumonia    Past Surgical History:  Procedure Laterality Date   APPENDECTOMY     CORONARY STENT INTERVENTION N/A 05/11/2023   Procedure: CORONARY STENT INTERVENTION;  Surgeon: Ladona Heinz, MD;  Location: [facility];  Service: Cardiovascular;  Laterality: N/A;   CYSTECTOMY  2008   cyst removal from left armpit   LEFT HEART CATH AND CORONARY ANGIOGRAPHY N/A 05/11/2023   Procedure: LEFT HEART CATH AND CORONARY ANGIOGRAPHY;  Surgeon: Ladona Heinz, MD;  Location: [facility];  Service: Cardiovascular;  Laterality: N/A;   Social History   Tobacco Use   Smoking status: Former    Current packs/day: 0.00    Average packs/day: 1.5 packs/day for 15.0 years (22.5 ttl pk-yrs)    Types: Cigarettes    Start date: 02/08/1996    Quit date: 02/08/2011    Years since quitting: 12.5   Smokeless tobacco: Never  Vaping Use   Vaping status: Never Used  Substance Use Topics   Alcohol use: Not Currently    Comment: Social once per quarter drinking.   Drug use: No      Patient Care Team: Lowella Benton CROME, [place] as PCP - General (Physician Assistant) Ladona Heinz, MD as PCP - Cardiology (Cardiology) Zulema Lorrene HERO, [facility] as Counselor [facility])   Outpatient Medications Prior to Visit  Medication Sig   amLODipine  (NORVASC ) 10 MG tablet Take 1 tablet (10 mg total) by mouth daily.   anastrozole (ARIMIDEX) 1 MG tablet Take 1 mg by mouth 2 (two) times a week.   aspirin  EC 81 MG tablet Take 1 tablet (81 mg total) by mouth daily. Swallow whole.   clopidogrel  (PLAVIX ) 75 MG tablet Take 1 tablet (75 mg total) by mouth daily.   Coenzyme Q10 (COQ10 PO) Take by mouth daily.   ezetimibe -simvastatin  (VYTORIN ) 10-40 MG tablet Take 1 tablet by mouth at bedtime.   furosemide (LASIX) 40 MG tablet Take 40 mg by mouth daily as needed for edema.   lisinopril -hydrochlorothiazide  (ZESTORETIC ) 20-12.5 MG tablet Take 1 tablet by mouth every morning.   metoprolol  tartrate (LOPRESSOR ) 100 MG  tablet Take 1 tablet (100 mg total) by mouth 2 (two) times daily.   pantoprazole  (PROTONIX ) 40 MG tablet Take 1 tablet (40 mg total) by mouth daily as needed (Heart burn).   testosterone enanthate (DELATESTRYL) 200 MG/ML injection Inject into the muscle once a week. For IM use only   [DISCONTINUED] DULoxetine  (CYMBALTA ) 30 MG capsule Take 1 capsule (30 mg total) by mouth daily.   [DISCONTINUED] nitroGLYCERIN  (NITROSTAT ) 0.4 MG SL tablet Place 1 tablet (0.4 mg total) under the tongue every 5 (five) minutes as needed.   No facility-administered medications prior to visit.    ROS  Complete 12 point ROS performed with all pertinent positives listed in HPI      Objective:     BP 131/80 (BP Location: Left Arm, Patient Position: Sitting, Cuff Size: Large)   Pulse 72   Resp 18   Ht 6' 2 (1.88 m)   Wt 270 lb (122.5 kg)   SpO2 98%   BMI 34.67 kg/m  BP Readings from Last 3 Encounters:  09/01/23 131/80  07/26/23 115/79  06/12/23 128/83   Wt Readings from Last 3 Encounters:  09/01/23 270 lb (122.5 kg)  07/26/23 271 lb 12 oz (123.3 kg)  06/12/23 266 lb (120.7 kg)      Physical Exam Vitals and nursing note reviewed.  Constitutional:      General: He is not in acute distress.    Appearance: Normal appearance. He is not ill-appearing, toxic-appearing or diaphoretic.  HENT:     Head: Normocephalic and atraumatic.     Right Ear: Tympanic membrane, ear canal and external ear normal. There is no impacted cerumen.     Left Ear: Tympanic membrane, ear canal and external ear normal. There is impacted cerumen.     Nose: Nose normal.     Mouth/Throat:     Mouth: Mucous membranes are moist.     Pharynx: Oropharynx is clear. No oropharyngeal exudate or posterior oropharyngeal erythema.  Eyes:     General: No scleral icterus.       Right eye: No discharge.        Left eye: No discharge.     Extraocular Movements: Extraocular movements intact.     Pupils: Pupils are equal, round, and reactive  to light.  Cardiovascular:     Rate and Rhythm: Normal rate and regular rhythm.     Pulses: Normal pulses.     Heart sounds: No murmur heard. Pulmonary:     Effort: Pulmonary effort is normal. No respiratory distress.     Breath sounds: Normal breath sounds. No stridor. No wheezing or rhonchi.  Abdominal:     General: Abdomen is flat. Bowel sounds are normal. There is no distension.     Palpations: Abdomen is soft. There is no mass.     Tenderness: There is no abdominal tenderness. There is no right CVA tenderness, left CVA tenderness, guarding or rebound.     Hernia: No hernia is present.     Comments: Percussion tympanic throughout  Musculoskeletal:     Cervical back: Normal range of motion and neck supple. No rigidity or tenderness.     Right lower leg: No edema.     Left lower leg: No edema.  Lymphadenopathy:     Cervical: No cervical adenopathy.  Skin:    General: Skin is warm and dry.     Coloration: Skin is not jaundiced.     Findings: Lesion (single seborrheic keratosis to R parietal region scalp) present. No bruising, erythema or rash.  Neurological:     General: No focal deficit present.     Mental Status: He is alert and oriented to person, place, and time.     Cranial Nerves: No cranial nerve deficit.     Sensory: No sensory deficit.     Motor: No weakness.     Gait: Gait normal.  Psychiatric:        Mood and Affect: Mood normal.        Behavior: Behavior normal.       Last CBC Lab Results  Component Value Date   WBC 7.8  05/09/2023   HGB 16.3 05/09/2023   HCT 48.0 05/09/2023   MCV 90 05/09/2023   MCH 30.6 05/09/2023   RDW 12.3 05/09/2023   PLT 218 05/09/2023   Last metabolic panel Lab Results  Component Value Date   GLUCOSE 79 05/09/2023   NA 140 05/09/2023   K 4.4 05/09/2023   CL 99 05/09/2023   CO2 26 05/09/2023   BUN 13 05/09/2023   CREATININE 1.12 05/09/2023   EGFR 82 05/09/2023   CALCIUM  9.7 05/09/2023   PROT 7.1 09/01/2023   ALBUMIN 4.9  09/01/2023   LABGLOB 2.2 09/02/2022   AGRATIO 2.2 09/28/2021   BILITOT 1.3 (H) 09/01/2023   ALKPHOS 90 09/01/2023   AST 25 09/01/2023   ALT 43 09/01/2023   ANIONGAP 7 08/16/2022   Last lipids Lab Results  Component Value Date   CHOL 123 07/19/2023   HDL 38 (L) 07/19/2023   LDLCALC 61 07/19/2023   TRIG 139 07/19/2023   CHOLHDL 3.2 07/19/2023    Last vitamin D  Lab Results  Component Value Date   VD25OH 75 05/16/2022   Last vitamin B12 and Folate Lab Results  Component Value Date   VITAMINB12 >2,000 (H) 05/16/2022   FOLATE 9.3 07/09/2021        Assessment & Plan:    Routine Health Maintenance and Physical Exam  Immunization History  Administered Date(s) Administered   Influenza Inj Mdck Quad Pf 11/03/2018   Influenza, Quadrivalent, Recombinant, Inj, Pf 11/11/2017   Influenza, Seasonal, Injecte, Preservative Fre 12/01/2013, 11/15/2015, 11/11/2017, 11/13/2019   Influenza,inj,Quad PF,6+ Mos 12/01/2013, 11/15/2015, 11/13/2019   Influenza,inj,quad, With Preservative 11/15/2016   Influenza-Unspecified 11/15/2016, 01/26/2021   PFIZER(Purple Top)SARS-COV-2 Vaccination 01/08/2020, 01/30/2020   Tdap 09/19/2018    Health Maintenance  Topic Date Due   Pneumococcal Vaccine 61-47 Years old (1 of 2 - PCV) Never done   Hepatitis B Vaccines (1 of 3 - 19+ 3-dose series) Never done   COVID-19 Vaccine (3 - Pfizer risk series) 02/27/2020   INFLUENZA VACCINE  09/08/2023   Colonoscopy  09/06/2028   DTaP/Tdap/Td (2 - Td or Tdap) 09/18/2028   Hepatitis C Screening  Completed   HIV Screening  Completed   HPV VACCINES  Aged Out   Meningococcal B Vaccine  Aged Out    Discussed health benefits of physical activity, and encouraged him to engage in regular exercise appropriate for his age and condition.  Problem List Items Addressed This Visit   None Visit Diagnoses       Adult wellness visit    -  Primary   Relevant Orders   Hemoglobin A1c (Completed)   TSH (Completed)   PSA  (Completed)   HIV Antibody (routine testing w rflx) (Completed)   Hepatitis C Antibody (Completed)   Hepatic function panel (Completed)     Encounter for screening for HIV       Relevant Orders   HIV Antibody (routine testing w rflx) (Completed)     Need for hepatitis C screening test       Relevant Orders   Hepatitis C Antibody (Completed)     Abnormal CT scan, pelvis       Relevant Orders   PSA (Completed)   CT ABDOMEN PELVIS WO CONTRAST     Screening for skin cancer       Relevant Orders   Ambulatory referral to Dermatology     Seborrheic keratosis of scalp       Relevant Orders   Ambulatory referral to Dermatology  Return in about 4 weeks (around 09/29/2023).    Assessment and Plan    Bloating and Abdominal Discomfort Negative H. pylori and stool tests. CT showed bladder wall thickening, no urinary symptoms. Slight prostate prominence. Possible anxiety-related symptoms. - Order repeat imaging for prostate and bladder area. - Order CT of abdomen and pelvis.  Scalp Lesions Scab-like lesions, similar to seborrheic keratosis. Dermatology referral requested. - Placed referral for dermatology.  Tinnitus High-pitched ringing, less than six months. Hard earwax against eardrum noted. - Recommend over-the-counter earwax softening drops for four days. - Advise to flush ears after using drops.  Lipoma Nodule on rib cage, painful with stress or exertion. Suspected lipoma. - Order CT of abdomen and pelvis to evaluate the area.  Annual Physical Examination Routine exam. Lipid panel satisfactory. A1c, thyroid , and prostate screenings due. Enlarged spleen resolved. - Order A1c, thyroid , and PSA screenings. - Perform HIV and hepatitis C screenings.  General Health Maintenance Up to date on colonoscopy and tetanus. Minimal physical activity. Discussed lifestyle modifications. - Encourage daily physical activity for at least 15 minutes. - Discuss potential benefits of  melatonin for sleep quality. - Encourage finding a physical activity that is enjoyable.  Follow-up Plans for screenings and imaging studies discussed. - Schedule CT of abdomen and pelvis. - Follow up with dermatology after referral. - Return for follow-up if symptoms persist after resuming Cymbalta .       Benton LITTIE Gave, PA

## 2023-09-01 NOTE — Progress Notes (Signed)
 1

## 2023-09-01 NOTE — Patient Instructions (Signed)
 We completed your annual physical today. Please read the attached handout.  Please start the cymbalta . Take one daily.  I have ordered a CT Scan of the abdomen and pelvis - please go to [address] to get this schedule.  Labs updated today.  Please schedule a follow up visit in 4 weeks to address your other concerns.  Dermatology referral have been placed for your scalp lesions.

## 2023-09-02 LAB — HEPATIC FUNCTION PANEL
ALT: 43 IU/L (ref 0–44)
AST: 25 IU/L (ref 0–40)
Albumin: 4.9 g/dL (ref 4.1–5.1)
Alkaline Phosphatase: 90 IU/L (ref 44–121)
Bilirubin Total: 1.3 mg/dL — ABNORMAL HIGH (ref 0.0–1.2)
Bilirubin, Direct: 0.4 mg/dL (ref 0.00–0.40)
Total Protein: 7.1 g/dL (ref 6.0–8.5)

## 2023-09-02 LAB — PSA: Prostate Specific Ag, Serum: 0.7 ng/mL (ref 0.0–4.0)

## 2023-09-02 LAB — HEMOGLOBIN A1C
Est. average glucose Bld gHb Est-mCnc: 91 mg/dL
Hgb A1c MFr Bld: 4.8 % (ref 4.8–5.6)

## 2023-09-02 LAB — HIV ANTIBODY (ROUTINE TESTING W REFLEX): HIV Screen 4th Generation wRfx: NONREACTIVE

## 2023-09-02 LAB — HEPATITIS C ANTIBODY: Hep C Virus Ab: NONREACTIVE

## 2023-09-02 LAB — TSH: TSH: 0.464 u[IU]/mL (ref 0.450–4.500)

## 2023-09-04 ENCOUNTER — Ambulatory Visit: Payer: Self-pay | Admitting: Urgent Care

## 2023-09-04 DIAGNOSIS — R0781 Pleurodynia: Secondary | ICD-10-CM

## 2023-09-04 DIAGNOSIS — R17 Unspecified jaundice: Secondary | ICD-10-CM

## 2023-09-04 DIAGNOSIS — I872 Venous insufficiency (chronic) (peripheral): Secondary | ICD-10-CM

## 2023-09-04 DIAGNOSIS — R1084 Generalized abdominal pain: Secondary | ICD-10-CM

## 2023-09-04 NOTE — Telephone Encounter (Signed)
 Whitney, please see message sent by pt about recent lab results and advise.

## 2023-09-05 ENCOUNTER — Other Ambulatory Visit

## 2023-09-11 ENCOUNTER — Ambulatory Visit

## 2023-09-11 DIAGNOSIS — R935 Abnormal findings on diagnostic imaging of other abdominal regions, including retroperitoneum: Secondary | ICD-10-CM

## 2023-09-19 NOTE — Telephone Encounter (Signed)
Yes that would be great. Thanks!

## 2023-09-19 NOTE — Telephone Encounter (Signed)
 Spoke with radiology reading room and they will change CT to STAT read=kph

## 2023-09-20 ENCOUNTER — Encounter (HOSPITAL_COMMUNITY): Payer: Self-pay

## 2023-09-28 ENCOUNTER — Ambulatory Visit (HOSPITAL_COMMUNITY)
Admission: RE | Admit: 2023-09-28 | Discharge: 2023-09-28 | Disposition: A | Discharge status: Home / Self Care | Source: Ambulatory Visit | Attending: Urgent Care | Admitting: Urgent Care

## 2023-09-28 DIAGNOSIS — N429 Disorder of prostate, unspecified: Secondary | ICD-10-CM | POA: Insufficient documentation

## 2023-09-28 MED ORDER — GADOBUTROL 1 MMOL/ML IV SOLN
10.0000 mL | Freq: Once | INTRAVENOUS | Status: AC | PRN
  Administered 2023-09-28: 10 mL via INTRAVENOUS

## 2023-09-29 ENCOUNTER — Ambulatory Visit: Admitting: Urgent Care

## 2023-10-05 ENCOUNTER — Ambulatory Visit: Admitting: Urgent Care

## 2023-10-10 ENCOUNTER — Ambulatory Visit: Payer: Self-pay | Admitting: Urgent Care

## 2023-10-12 ENCOUNTER — Ambulatory Visit (INDEPENDENT_AMBULATORY_CARE_PROVIDER_SITE_OTHER): Admitting: Urgent Care

## 2023-10-12 VITALS — BP 103/69 | HR 71 | Resp 20 | Ht 74.0 in | Wt 268.0 lb

## 2023-10-12 DIAGNOSIS — R140 Abdominal distension (gaseous): Secondary | ICD-10-CM | POA: Diagnosis not present

## 2023-10-12 DIAGNOSIS — R1012 Left upper quadrant pain: Secondary | ICD-10-CM | POA: Diagnosis not present

## 2023-10-12 DIAGNOSIS — R17 Unspecified jaundice: Secondary | ICD-10-CM

## 2023-10-12 DIAGNOSIS — R1084 Generalized abdominal pain: Secondary | ICD-10-CM

## 2023-10-12 DIAGNOSIS — R790 Abnormal level of blood mineral: Secondary | ICD-10-CM

## 2023-10-12 MED ORDER — RIFAXIMIN 550 MG PO TABS: 550.0000 mg | ORAL_TABLET | Freq: Three times a day (TID) | ORAL | 0 refills | Status: AC

## 2023-10-12 NOTE — Patient Instructions (Addendum)
 Please call [facility] endoscopy lab to schedule a hydrogen breath test.  Labs completed today. Please also complete stool test.  Start xifaxan  as prescribed. If too expensive, have pharmacy reach out for an alternative.  For now, lets hold off on the HIDA scan.

## 2023-10-12 NOTE — Progress Notes (Unsigned)
 Established Patient Office Visit  Subjective:  Patient ID: Paul Freeman, male    DOB: 10/16/1975  Age: 48 y.o. MRN: [id number]  Chief Complaint  Patient presents with   Results    HPI  Discussed the use of AI scribe software for clinical note transcription with the patient, who gave verbal consent to proceed.  History of Present Illness   Paul Freeman is a 48 year old male who presents with persistent left-sided abdominal pain and bloating.  He experiences persistent left-sided abdominal pain described as a constant pressure, sometimes causing shortness of breath. The pain is exacerbated by stress and is somewhat relieved when lying on his back. He mentions a 'nodule' in the area, which he attributes to frequent touching over the years.  He experiences significant bloating and gas throughout the day, with his abdomen becoming progressively larger and harder by evening. He wakes up with a flatter abdomen, which expands as the day progresses. He experiences frequent burping, unrelated to food intake, and describes himself as 'full of gas' all day. No diarrhea, but occasional loose stools.  He has been taking omeprazole 40 mg daily, and an endoscopy in November of the previous year revealed gastric ulcerations. He underwent a colonoscopy less than five years ago and an endoscopy in November of the previous year, which revealed gastric ulcerations. He has not followed up with the gastroenterologist due to scheduling conflicts.  In 2005, he experienced extremely elevated liver enzymes, over 1000, which resolved spontaneously after three to four months. Extensive testing, including hepatitis panels, revealed no cause. His bilirubin levels have been consistently on the high side of normal, but recent tests show they are almost back to normal. He has a history of fatty liver, identified as diffuse hepatic steatosis on imaging, but no longer has an enlarged spleen as per recent imaging. He  underwent a bone marrow biopsy in the past due to concerns about his spleen, which returned normal results. A CT scan showed dystrophic calcification of the prostate, but his PSA levels are normal.  He reports a family history of cirrhosis, with his grandmother dying from the condition at age 55, despite not consuming alcohol.       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   Cervical disc disease 02/24/2019   Elevated coronary artery calcium  score 02/24/2019   History of hyperthyroidism 02/24/2019   Numbness of face 01/02/2019   Situational stress 09/19/2018   Neck pain 09/19/2018   G6PD deficiency Mild 09/19/2018   Non-restorative sleep 01/03/2018   Other chronic pain 01/03/2018   Retrognathia 01/03/2018   Loud snoring 01/03/2018   Myalgia 01/03/2018   Chronic fatigue 01/03/2018   Hypertension 06/23/2017   Anxiety    Depression    Past Medical History:  Diagnosis Date   Allergy    Possible Penicillin, Pollen   Anal fissure    Anxiety    Arthritis    Depression    G6P deficiency (glucose-6-phosphatase deficiency) (HCC)    Hx of adenomatous polyp of colon 09/13/2021   Hypertension    Mild hyperlipidemia    Pneumonia    Past Surgical History:  Procedure Laterality Date   APPENDECTOMY     CORONARY STENT INTERVENTION N/A 05/11/2023  Procedure: CORONARY STENT INTERVENTION;  Surgeon: Ladona Heinz, MD;  Location: [facility];  Service: Cardiovascular;  Laterality: N/A;   CYSTECTOMY  2008   cyst removal from left armpit   LEFT HEART CATH AND CORONARY ANGIOGRAPHY N/A 05/11/2023   Procedure: LEFT HEART CATH AND CORONARY ANGIOGRAPHY;  Surgeon: Ladona Heinz, MD;  Location: [facility];  Service: Cardiovascular;  Laterality:  N/A;   Social History   Tobacco Use   Smoking status: Former    Current packs/day: 0.00    Average packs/day: 1.5 packs/day for 15.0 years (22.5 ttl pk-yrs)    Types: Cigarettes    Start date: 02/08/1996    Quit date: 02/08/2011    Years since quitting: 12.6   Smokeless tobacco: Never  Vaping Use   Vaping status: Never Used  Substance Use Topics   Alcohol use: Not Currently    Comment: Social once per quarter drinking.   Drug use: No      ROS: as noted in HPI  Objective:     BP 103/69 (BP Location: Right Arm, Patient Position: Sitting, Cuff Size: Normal)   Pulse 71   Resp 20   Ht 6' 2 (1.88 m)   Wt 268 lb (121.6 kg)   SpO2 98%   BMI 34.41 kg/m  BP Readings from Last 3 Encounters:  10/12/23 103/69  09/01/23 131/80  07/26/23 115/79   Wt Readings from Last 3 Encounters:  10/12/23 268 lb (121.6 kg)  09/01/23 270 lb (122.5 kg)  07/26/23 271 lb 12 oz (123.3 kg)      Physical Exam   No results found for any visits on 10/12/23.  Last CBC Lab Results  Component Value Date   WBC 7.8 05/09/2023   HGB 16.3 05/09/2023   HCT 48.0 05/09/2023   MCV 90 05/09/2023   MCH 30.6 05/09/2023   RDW 12.3 05/09/2023   PLT 218 05/09/2023   Last lipids Lab Results  Component Value Date   CHOL 123 07/19/2023   HDL 38 (L) 07/19/2023   LDLCALC 61 07/19/2023   TRIG 139 07/19/2023   CHOLHDL 3.2 07/19/2023      The ASCVD Risk score (Arnett DK, et al., 2019) failed to calculate for the following reasons:   The valid total cholesterol range is 130 to 320 mg/dL  Assessment & Plan:  High total bilirubin -     CMP14+EGFR  Generalized abdominal pain -     rifAXIMin ; Take 1 tablet (550 mg total) by mouth 3 (three) times daily for 14 days.  Dispense: 42 tablet; Refill: 0 -     Pancreatic elastase, fecal  Abdominal bloating -     Breath hydrogen test; Future -     rifAXIMin ; Take 1 tablet (550 mg total) by mouth 3 (three) times daily for 14 days.  Dispense: 42 tablet; Refill:  0 -     CMP14+EGFR -     Pancreatic elastase, fecal  Left upper quadrant abdominal pain -     rifAXIMin ; Take 1 tablet (550 mg total) by mouth 3 (three) times daily for 14 days.  Dispense: 42 tablet; Refill: 0 -     Amylase -     Lipase -     CMP14+EGFR  Low serum phosphorus for age -     Phosphorus   Assessment and Plan    Chronic left upper quadrant abdominal pain and bloating Chronic pain and bloating with gas and pressure, worsens with stress. Differential includes exocrine pancreatic insufficiency, splenic  flexure syndrome, and SIBO. Imaging showed diffuse hepatic steatosis, atypical for gallbladder issues. - Order hydrogen breath test for SIBO at [facility]. - Order fecal pancreatic elastase test for exocrine pancreatic insufficiency. - Prescribe Xifaxan  550 mg pending insurance approval for SIBO. - Consider alternative medication if Xifaxan  not covered. - Recheck liver function tests. - Consider antispasmodic trial if symptoms persist.  Fatty liver (hepatic steatosis) Diffuse hepatic steatosis noted on imaging. Common condition, not immediately concerning unless progressing to cirrhosis. - Monitor liver function tests.  Gastric ulcerations Gastric ulcerations identified during endoscopy. Follow-up with gastroenterology missed. - Continue omeprazole 40 mg daily. - Encourage gastroenterology follow-up.  Mildly elevated bilirubin Mildly elevated bilirubin improving, historically high normal. No significant liver dysfunction. - Monitor bilirubin levels with liver function tests.        No follow-ups on file.   Benton LITTIE Gave, PA

## 2023-10-13 ENCOUNTER — Ambulatory Visit: Payer: Self-pay | Admitting: Urgent Care

## 2023-10-13 ENCOUNTER — Ambulatory Visit (INDEPENDENT_AMBULATORY_CARE_PROVIDER_SITE_OTHER)

## 2023-10-13 ENCOUNTER — Encounter: Payer: Self-pay | Admitting: Urgent Care

## 2023-10-13 DIAGNOSIS — M7989 Other specified soft tissue disorders: Secondary | ICD-10-CM | POA: Diagnosis not present

## 2023-10-13 DIAGNOSIS — I872 Venous insufficiency (chronic) (peripheral): Secondary | ICD-10-CM

## 2023-10-13 LAB — CMP14+EGFR
ALT: 34 IU/L (ref 0–44)
AST: 24 IU/L (ref 0–40)
Albumin: 5.1 g/dL (ref 4.1–5.1)
Alkaline Phosphatase: 92 IU/L (ref 44–121)
BUN/Creatinine Ratio: 10 (ref 9–20)
BUN: 11 mg/dL (ref 6–24)
Bilirubin Total: 1.7 mg/dL — ABNORMAL HIGH (ref 0.0–1.2)
CO2: 24 mmol/L (ref 20–29)
Calcium: 10 mg/dL (ref 8.7–10.2)
Chloride: 99 mmol/L (ref 96–106)
Creatinine, Ser: 1.15 mg/dL (ref 0.76–1.27)
Globulin, Total: 2.2 g/dL (ref 1.5–4.5)
Glucose: 89 mg/dL (ref 70–99)
Potassium: 4.4 mmol/L (ref 3.5–5.2)
Sodium: 140 mmol/L (ref 134–144)
Total Protein: 7.3 g/dL (ref 6.0–8.5)
eGFR: 79 mL/min/1.73 (ref >59)

## 2023-10-13 LAB — PHOSPHORUS: Phosphorus: 3.1 mg/dL (ref 2.8–4.1)

## 2023-10-13 LAB — AMYLASE: Amylase: 63 U/L (ref 31–110)

## 2023-10-13 LAB — LIPASE: Lipase: 67 U/L (ref 13–78)

## 2023-10-18 ENCOUNTER — Encounter: Payer: Self-pay | Admitting: Cardiology

## 2023-10-30 NOTE — Telephone Encounter (Signed)
 See her note below she encouraged him to reach back out to his GI for this so I am not sure what he is asking for?

## 2023-10-31 NOTE — Telephone Encounter (Signed)
 Patient will reach out to GI  to discuss this further. Given contact information for digestive specialist [place].

## 2023-11-07 ENCOUNTER — Encounter (INDEPENDENT_AMBULATORY_CARE_PROVIDER_SITE_OTHER): Admitting: Urgent Care

## 2023-11-07 DIAGNOSIS — F411 Generalized anxiety disorder: Secondary | ICD-10-CM

## 2023-11-08 MED ORDER — FLUOXETINE HCL 20 MG PO CAPS: 20.0000 mg | ORAL_CAPSULE | Freq: Every day | ORAL | 3 refills | Status: DC

## 2023-11-08 NOTE — Telephone Encounter (Signed)

## 2023-11-16 ENCOUNTER — Other Ambulatory Visit: Payer: Self-pay | Admitting: Cardiology

## 2023-11-16 DIAGNOSIS — I10 Essential (primary) hypertension: Secondary | ICD-10-CM

## 2023-11-16 MED ORDER — EZETIMIBE-SIMVASTATIN 10-40 MG PO TABS: 1.0000 | ORAL_TABLET | Freq: Every day | ORAL | 2 refills | Status: DC

## 2023-11-16 MED ORDER — PANTOPRAZOLE SODIUM 40 MG PO TBEC: 40.0000 mg | DELAYED_RELEASE_TABLET | Freq: Every day | ORAL | 1 refills | Status: AC | PRN

## 2023-11-17 ENCOUNTER — Other Ambulatory Visit (HOSPITAL_COMMUNITY): Payer: Self-pay

## 2023-11-17 ENCOUNTER — Telehealth: Payer: Self-pay | Admitting: Pharmacy Technician

## 2023-11-17 NOTE — Telephone Encounter (Signed)
   Pharmacy Patient Advocate Encounter   Received notification from Onbase that prior authorization for ezetimibe /simvastatin  is required/requested.   Insurance verification completed.   The patient is insured through [facility].   Per test claim: PA required; PA submitted to above mentioned insurance via Latent Key/confirmation #/EOC [id number] Status is pending

## 2023-11-17 NOTE — Telephone Encounter (Signed)
 Pharmacy Patient Advocate Encounter  Received notification from [employer] that Prior Authorization for ezetimibe /simvastatin  has been APPROVED from 11/17/23 to 11/16/24   PA #/Case ID/Reference #: [id number]

## 2023-11-22 ENCOUNTER — Telehealth: Payer: Self-pay | Admitting: Cardiology

## 2023-11-22 NOTE — Telephone Encounter (Signed)
  Per MyChart scheduling message:   Pt c/o of Chest Pain: STAT if active (IN THIS MOMENT) CP, including tightness, pressure, jaw pain, shoulder/upper arm/back pain, SOB, nausea, and vomiting.  1. Are you having chest pain right now (tightness, pressure, or discomfort)? No   2. Are you experiencing any other symptoms (ex. Shortness of breath, nausea, vomiting, sweating)? Not really.    3. How long have you been experiencing chest pain? Few weeks.    4. Is your chest pain continuous or coming and going? Comes and goes.    5. Have you taken Nitroglycerin ? No.

## 2023-11-22 NOTE — Telephone Encounter (Signed)
 Called patient regarding appt request. Has no chest pain at rest. He does not have chest pain with exertion all the time, states he could run 10 blocks and go on bike ride with no symptoms. States random, fluttering pain. When he has pain, lasts several hours. No SOB/DOE. No NTG use - gives him instant headache.   Offered 10/21 with MD -- declined Offered 10/23 with MD -- accepted -- 10:40am  Routed to Dr. Ladona as [facility]  ED precautions provided.

## 2023-11-29 ENCOUNTER — Ambulatory Visit: Admitting: Urgent Care

## 2023-11-30 ENCOUNTER — Encounter: Payer: Self-pay | Admitting: Urgent Care

## 2023-11-30 ENCOUNTER — Encounter (HOSPITAL_COMMUNITY): Payer: Self-pay | Admitting: *Deleted

## 2023-11-30 ENCOUNTER — Ambulatory Visit: Attending: Internal Medicine | Admitting: Cardiology

## 2023-11-30 ENCOUNTER — Ambulatory Visit (INDEPENDENT_AMBULATORY_CARE_PROVIDER_SITE_OTHER): Admitting: Urgent Care

## 2023-11-30 ENCOUNTER — Encounter: Payer: Self-pay | Admitting: Cardiology

## 2023-11-30 VITALS — BP 125/76 | HR 66 | Resp 16 | Ht 74.0 in | Wt 271.6 lb

## 2023-11-30 VITALS — BP 115/75 | HR 70 | Ht 74.0 in | Wt 270.0 lb

## 2023-11-30 DIAGNOSIS — I25118 Atherosclerotic heart disease of native coronary artery with other forms of angina pectoris: Secondary | ICD-10-CM

## 2023-11-30 DIAGNOSIS — E7800 Pure hypercholesterolemia, unspecified: Secondary | ICD-10-CM

## 2023-11-30 DIAGNOSIS — R3914 Feeling of incomplete bladder emptying: Secondary | ICD-10-CM

## 2023-11-30 DIAGNOSIS — N401 Enlarged prostate with lower urinary tract symptoms: Secondary | ICD-10-CM | POA: Diagnosis not present

## 2023-11-30 DIAGNOSIS — R300 Dysuria: Secondary | ICD-10-CM

## 2023-11-30 DIAGNOSIS — I10 Essential (primary) hypertension: Secondary | ICD-10-CM | POA: Diagnosis not present

## 2023-11-30 DIAGNOSIS — D75A Glucose-6-phosphate dehydrogenase (G6PD) deficiency without anemia: Secondary | ICD-10-CM

## 2023-11-30 LAB — POCT URINALYSIS DIP (CLINITEK)
Bilirubin, UA: NEGATIVE
Blood, UA: NEGATIVE
Glucose, UA: NEGATIVE mg/dL
Ketones, POC UA: NEGATIVE mg/dL
Leukocytes, UA: NEGATIVE
Nitrite, UA: NEGATIVE
POC PROTEIN,UA: 30 — AB
Spec Grav, UA: 1.015 (ref 1.010–1.025)
Urobilinogen, UA: 0.2 U/dL
pH, UA: 7.5 (ref 5.0–8.0)

## 2023-11-30 MED ORDER — EZETIMIBE-SIMVASTATIN 10-40 MG PO TABS: 1.0000 | ORAL_TABLET | Freq: Every day | ORAL | 3 refills | Status: AC

## 2023-11-30 MED ORDER — TAMSULOSIN HCL 0.4 MG PO CAPS
0.4000 mg | ORAL_CAPSULE | Freq: Every day | ORAL | 3 refills | Status: AC
Start: 2023-11-30 — End: ?

## 2023-11-30 MED ORDER — FOSFOMYCIN TROMETHAMINE 3 G PO PACK: 3.0000 g | PACK | Freq: Every day | ORAL | 0 refills | Status: AC

## 2023-11-30 NOTE — Progress Notes (Signed)
 Established Patient Office Visit  Subjective:  Patient ID: Paul Freeman, male    DOB: [birth year]  Age: 48 y.o. MRN: [id number]  Chief Complaint  Patient presents with   Dysuria    Off and on x 1 week with frequency and urgency    HPI  Discussed the use of AI scribe software for clinical note transcription with the patient, who gave verbal consent to proceed.  History of Present Illness   Paul Freeman is a 48 year old male with benign prostatic hyperplasia who presents with urinary symptoms and dysuria.  He experiences increased urinary frequency, particularly nocturia, waking up two to three times at night compared to once previously. He has difficulty initiating urination, often requiring manual pressure to start the flow, and a persistent sensation of needing to urinate even after voiding. A burning sensation during urination began approximately a week and a half ago, described as intermittent with some periods of relief. Increasing water intake has provided some relief, but the burning persists.  He has a history of flank pain, for which a CT scan of the abdomen was performed, and an MRI of the prostate revealed a slightly enlarged prostate with early changes of benign prostatic hyperplasia. He recalls a normal PSA test in July and has no known history of elevated PSA levels.  His family history includes his father having similar urinary issues starting in his forties and a history of prostate cancer. He also has a known diagnosis of glucose-6-phosphate dehydrogenase deficiency, which limits his antibiotic options. No fever, severe discomfort, or constipation is present, but he mentions occasional gassiness, which he does not believe is related to his current urinary symptoms.      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   Cervical disc disease 02/24/2019   Elevated coronary artery calcium  score 02/24/2019   History of hyperthyroidism 02/24/2019   Numbness of face 01/02/2019   Situational stress 09/19/2018   Neck pain 09/19/2018   G6PD deficiency Mild 09/19/2018   Non-restorative sleep 01/03/2018   Other chronic pain 01/03/2018   Retrognathia 01/03/2018   Loud snoring 01/03/2018   Myalgia 01/03/2018   Chronic fatigue 01/03/2018   Hypertension 06/23/2017   Anxiety    Depression    Past Medical History:  Diagnosis Date   Allergy    Possible Penicillin, Pollen   Anal fissure    Anxiety    Arthritis    Depression    G6P deficiency (glucose-6-phosphatase deficiency) (HCC)    Hx of adenomatous polyp of colon 09/13/2021   Hypertension    Mild hyperlipidemia    Pneumonia    Past Surgical History:  Procedure Laterality Date   APPENDECTOMY     CORONARY STENT INTERVENTION N/A 05/11/2023   Procedure: CORONARY STENT INTERVENTION;  Surgeon: Ladona Heinz, MD;  Location: [facility];  Service: Cardiovascular;  Laterality: N/A;   CYSTECTOMY  2008   cyst removal from left armpit   LEFT HEART CATH AND CORONARY ANGIOGRAPHY N/A 05/11/2023   Procedure: LEFT HEART CATH AND CORONARY ANGIOGRAPHY;  Surgeon: Ladona Heinz, MD;  Location: [facility];  Service: Cardiovascular;  Laterality: N/A;   Social History   Tobacco Use   Smoking status: Former  Current packs/day: 0.00    Average packs/day: 1.5 packs/day for 15.0 years (22.5 ttl pk-yrs)    Types: Cigarettes    Start date: 02/08/1996    Quit date: 02/08/2011    Years since quitting: 12.8   Smokeless tobacco: Never  Vaping Use   Vaping status: Never Used  Substance Use Topics   Alcohol use: Not Currently    Comment: Social once per quarter drinking.   Drug use: No      ROS: as noted in HPI  Objective:     BP 115/75   Pulse 70   Ht 6' 2  (1.88 m)   Wt 270 lb (122.5 kg)   SpO2 97%   BMI 34.67 kg/m  BP Readings from Last 3 Encounters:  11/30/23 115/75  11/30/23 125/76  10/12/23 103/69   Wt Readings from Last 3 Encounters:  11/30/23 270 lb (122.5 kg)  11/30/23 271 lb 9.6 oz (123.2 kg)  10/12/23 268 lb (121.6 kg)      Physical Exam Vitals and nursing note reviewed.  Constitutional:      General: He is not in acute distress.    Appearance: Normal appearance. He is not ill-appearing, toxic-appearing or diaphoretic.  HENT:     Head: Normocephalic and atraumatic.     Right Ear: External ear normal.     Left Ear: External ear normal.     Nose: Nose normal.  Eyes:     General: No scleral icterus.    Pupils: Pupils are equal, round, and reactive to light.  Cardiovascular:     Rate and Rhythm: Normal rate.  Pulmonary:     Effort: Pulmonary effort is normal. No respiratory distress.     Breath sounds: Normal breath sounds.  Abdominal:     General: Abdomen is flat. There is no distension.     Palpations: Abdomen is soft.     Tenderness: There is no abdominal tenderness. There is no right CVA tenderness, left CVA tenderness, guarding or rebound.  Skin:    General: Skin is warm and dry.     Findings: No erythema or rash.  Neurological:     General: No focal deficit present.     Mental Status: He is alert and oriented to person, place, and time.     Gait: Gait normal.  Psychiatric:        Mood and Affect: Mood normal.        Behavior: Behavior normal.      Results for orders placed or performed in visit on 11/30/23  POCT URINALYSIS DIP (CLINITEK)  Result Value Ref Range   Color, UA yellow yellow   Clarity, UA clear clear   Glucose, UA negative negative mg/dL   Bilirubin, UA negative negative   Ketones, POC UA negative negative mg/dL   Spec Grav, UA 8.984 8.989 - 1.025   Blood, UA negative negative   pH, UA 7.5 5.0 - 8.0   POC PROTEIN,UA =30 (A) negative, trace   Urobilinogen, UA 0.2 0.2 or 1.0 E.U./dL    Nitrite, UA Negative Negative   Leukocytes, UA Negative Negative    Lab Results  Component Value Date   PSA1 0.7 09/01/2023   PSA 0.4 09/06/2019   PSA 0.4 02/22/2019   Last CBC Lab Results  Component Value Date   WBC 7.8 05/09/2023   HGB 16.3 05/09/2023   HCT 48.0 05/09/2023   MCV 90 05/09/2023   MCH 30.6 05/09/2023   RDW 12.3 05/09/2023   PLT 218 05/09/2023  Last metabolic panel Lab Results  Component Value Date   GLUCOSE 89 10/12/2023   NA 140 10/12/2023   K 4.4 10/12/2023   CL 99 10/12/2023   CO2 24 10/12/2023   BUN 11 10/12/2023   CREATININE 1.15 10/12/2023   EGFR 79 10/12/2023   CALCIUM  10.0 10/12/2023   PHOS 3.1 10/12/2023   PROT 7.3 10/12/2023   ALBUMIN 5.1 10/12/2023   LABGLOB 2.2 10/12/2023   AGRATIO 2.2 09/28/2021   BILITOT 1.7 (H) 10/12/2023   ALKPHOS 92 10/12/2023   AST 24 10/12/2023   ALT 34 10/12/2023   ANIONGAP 7 08/16/2022   Last lipids Lab Results  Component Value Date   CHOL 123 07/19/2023   HDL 38 (L) 07/19/2023   LDLCALC 61 07/19/2023   TRIG 139 07/19/2023   CHOLHDL 3.2 07/19/2023   Last hemoglobin A1c Lab Results  Component Value Date   HGBA1C 4.8 09/01/2023      The ASCVD Risk score (Arnett DK, et al., 2019) failed to calculate for the following reasons:   The valid total cholesterol range is 130 to 320 mg/dL  Assessment & Plan:  Dysuria -     POCT URINALYSIS DIP (CLINITEK) -     Urine Culture -     Fosfomycin Tromethamine ; Take 3 g by mouth daily.  Dispense: 7 each; Refill: 0 -     Tamsulosin HCl; Take 1 capsule (0.4 mg total) by mouth daily.  Dispense: 30 capsule; Refill: 3 -     Ambulatory referral to Urology  Benign prostatic hyperplasia with incomplete bladder emptying -     Urine Culture -     Fosfomycin Tromethamine ; Take 3 g by mouth daily.  Dispense: 7 each; Refill: 0 -     Tamsulosin HCl; Take 1 capsule (0.4 mg total) by mouth daily.  Dispense: 30 capsule; Refill: 3 -     Ambulatory referral to  Urology  G6PD deficiency  Assessment and Plan    Chronic prostatitis and benign prostatic hyperplasia with dysuria Symptoms include nocturia, hesitancy, and dysuria. MRI indicated slight prostate enlargement with early BPH changes. PSA normal. G6PD deficiency limits antibiotic options. - Prescribed fosfomycin for prostatitis, considering G6PD deficiency. - Prescribed Flomax for BPH symptoms. - Sent urine for culture to rule out UTI. - Referred to urology for further evaluation.  G6PD deficiency G6PD deficiency restricts antibiotic options for prostatitis treatment. He is informed about medication implications. - Avoid Bactrim and Cipro. - Ensure fosfomycin safety with G6PD deficiency.         No follow-ups on file.   Benton LITTIE Gave, PA

## 2023-11-30 NOTE — Patient Instructions (Signed)
 Medication Instructions:  Your physician has recommended you make the following change in your medication:   ** Stop Aspirin   *If you need a refill on your cardiac medications before your next appointment, please call your pharmacy*  Lab Work: None ordered.  If you have labs (blood work) drawn today and your tests are completely normal, you will receive your results only by: MyChart Message (if you have MyChart) OR A paper copy in the mail If you have any lab test that is abnormal or we need to change your treatment, we will call you to review the results.  Testing/Procedures: Your physician has requested that you have an exercise tolerance test. For further information please visit [email or web address] Please also follow instruction sheet, as given.   Follow-Up: At [facility], you and your health needs are our priority.  As part of our continuing mission to provide you with exceptional heart care, our providers are all part of one team.  This team includes your primary Cardiologist (physician) and Advanced Practice Providers or APPs (Physician Assistants and Nurse Practitioners) who all work together to provide you with the care you need, when you need it.  Your next appointment:   6 months with Dr Ganji

## 2023-11-30 NOTE — Progress Notes (Signed)
 " Cardiology Office Note:  .   Date:  11/30/2023  ID:  Paul Freeman, DOB [birth year], MRN [id number] PCP: Paul Benton CROME, PA  [facility] Providers Cardiologist:  Paul Bergamo, MD   History of Present Illness: .   Paul Freeman is a 48 y.o. Caucasian male with hypertension, extreme anxiety, mild G6PD deficiency, who has responded well to blood pressure control with lisinopril /HCT, HCT utilized in spite of G6PD deficiency at a low dose, hypercholesterolemia, anxiety and stress, coronary calcium  score in the 97 percentile in 2023 underwent coronary CT angiogram on 05/05/2023 revealing a hemodynamically significant mid LAD to proximal LAD stenosis and recommended cardiac catheterization.  Cardiac catheterization on 05/11/2023 revealed a proximal to mid LAD 80% stenosis for which he underwent successful stenting with a 4.0 x 20 mm Synergy XD DES.   He made an appointment in view of chest pain. CP symptoms are getting better over the past 1 week. Symptoms are atypical.  Cardiac Studies relevent.    CARDIAC CATHETERIZATION 05/11/2023  Mid LAD with implantation of a 4.0 x 20 mm Synergy XD DES, 80% stenosis reduced to 0%       Discussed the use of AI scribe software for clinical note transcription with the patient, who gave verbal consent to proceed.  History of Present Illness He experiences chest pain described as a 'fluttering' or 'weak, throbbing feeling' on the side, lasting 5 to 10 minutes, sometimes making it hard to breathe. Stress and aggravation worsen the symptoms, but there is no pain upon breathing.  He has a previous stent placement with no other blockages identified. Current medications include metoprolol  tartrate 100 mg twice a day, lisinopril  HCT 20/12.5 mg once a day, Vytorin  10/40 mg once a day, and Plavix  75 mg once a day. Cholesterol levels are well-controlled, with a total cholesterol of 129 and LDL of 61.  He experiences inconsistent exertional dyspnea, sometimes able  to run up and down stairs without issue, but at other times feels winded after minimal exertion. Stress from work-related staffing issues has worsened this symptom.  Labs   Lab Results  Component Value Date   CHOL 123 07/19/2023   HDL 38 (L) 07/19/2023   LDLCALC 61 07/19/2023   TRIG 139 07/19/2023   CHOLHDL 3.2 07/19/2023   No results found for: LIPOA  Recent Labs    03/15/23 1259 05/09/23 1201 10/12/23 1123  NA 142 140 140  K 3.9 4.4 4.4  CL 98 99 99  CO2 25 26 24   GLUCOSE 89 79 89  BUN 11 13 11   CREATININE 1.04 1.12 1.15  CALCIUM  9.7 9.7 10.0    Lab Results  Component Value Date   ALT 34 10/12/2023   AST 24 10/12/2023   ALKPHOS 92 10/12/2023   BILITOT 1.7 (H) 10/12/2023      Latest Ref Rng & Units 05/09/2023   12:01 PM 10/13/2022    8:09 AM 08/16/2022   12:30 PM  CBC  WBC 3.4 - 10.8 x10E3/uL 7.8  6.2    Hemoglobin 13.0 - 17.7 g/dL 83.6  83.1  82.4   Hematocrit 37.5 - 51.0 % 48.0  49.3    Platelets 150 - 450 x10E3/uL 218  173     Lab Results  Component Value Date   HGBA1C 4.8 09/01/2023    Lab Results  Component Value Date   TSH 0.464 09/01/2023     ROS  Review of Systems  Cardiovascular:  Positive for chest pain. Negative for  dyspnea on exertion and leg swelling.   Physical Exam:   VS:  BP 125/76 (BP Location: Left Arm, Patient Position: Sitting, Cuff Size: Large)   Pulse 66   Resp 16   Ht 6' 2 (1.88 m)   Wt 271 lb 9.6 oz (123.2 kg)   SpO2 97%   BMI 34.87 kg/m    Wt Readings from Last 3 Encounters:  11/30/23 271 lb 9.6 oz (123.2 kg)  10/12/23 268 lb (121.6 kg)  09/01/23 270 lb (122.5 kg)    BP Readings from Last 3 Encounters:  11/30/23 125/76  10/12/23 103/69  09/01/23 131/80   Physical Exam Constitutional:      Appearance: He is obese.  Neck:     Vascular: No carotid bruit or JVD.  Cardiovascular:     Rate and Rhythm: Normal rate and regular rhythm.     Pulses: Intact distal pulses.     Heart sounds: Normal heart sounds. No murmur  heard.    No gallop.  Pulmonary:     Effort: Pulmonary effort is normal.     Breath sounds: Normal breath sounds.  Abdominal:     General: Bowel sounds are normal.     Palpations: Abdomen is soft.  Musculoskeletal:     Right lower leg: No edema.     Left lower leg: No edema.    EKG:    EKG Interpretation Date/Time:  Thursday November 30 2023 11:06:46 EDT Ventricular Rate:  71 PR Interval:  158 QRS Duration:  96 QT Interval:  370 QTC Calculation: 402 R Axis:   62  Text Interpretation: EKG 11/30/2023: Normal sinus rhythm at rate of 71 bpm, early repolarization.  Compared to 06/12/2023, no change. Confirmed by Treyton Slimp, Jagadeesh [phone]) on 11/30/2023 11:11:25 AM    ASSESSMENT AND PLAN: .      ICD-10-CM   1. Coronary artery disease of native artery of native heart with stable angina pectoris  I25.118 EKG 12-Lead    ezetimibe -simvastatin  (VYTORIN ) 10-40 MG tablet    Cardiac Stress Test: Informed Consent Details: Physician/Practitioner Attestation; Transcribe to consent form and obtain patient signature    2. Primary hypertension  I10     3. Hypercholesteremia  E78.00 ezetimibe -simvastatin  (VYTORIN ) 10-40 MG tablet     Assessment & Plan Atherosclerotic heart disease of native coronary artery with angina pectoris Intermittent chest pain with a weak, throbbing feeling and occasional shortness of breath. Symptoms have improved since last week. Stress and aggravation exacerbate symptoms. Previous angiogram showed only one blockage, treated with a stent. Current EKG and physical exam are normal. Low likelihood of restenosis given well-controlled cholesterol and blood pressure. Occasional chest pain may be due to coronary artery spasm triggered by stress or exertion. Restenosis risk is very low, approximately 0.1%, due to well-controlled risk factors. - Order treadmill stress test to evaluate cardiac function under exertion. - Continue Plavix  75 mg once daily. - Discontinue aspirin  due to  lack of benefit and potential harm, including risk of stomach ulcer and exacerbation of G6PD deficiency.  Precordial pain Chest pain is not consistent and does not occur with every exertion. Symptoms are not indicative of constant or severe angina. Possible coronary artery spasm due to stress or adrenaline release. - Order treadmill stress test to assess for any cardiac issues during exertion.  Essential hypertension Blood pressure is well controlled with current medication regimen. - Continue current antihypertensive medications: metoprolol  tartrate 100 mg twice daily and lisinopril  HCT 20/12.5 mg once daily.  Hyperlipidemia Cholesterol levels are  well controlled with current medication regimen. Recent cholesterol levels were low, indicating effective management. - Continue Vytorin  10/40 mg once daily for cholesterol management.  G6PD deficiency Aspirin  is contraindicated due to G6PD deficiency, as it can exacerbate the condition, however he did tolerate this without adverse effects. As he has completed 6 months with DAPT, discontinue aspirin  to avoid potential complications related to G6PD deficiency.   Follow up: 6 months  Signed,  Paul Bergamo, MD, [facility] 11/30/2023, 11:38 AM [facility] [address] [place], [place] [address] Phone: [phone]. Fax:  [phone]  "

## 2023-11-30 NOTE — Patient Instructions (Addendum)
 [facility] Address: [address], [place], [place] [address] Phone: [phone]  Call the above location to schedule follow up. Start flomax once daily. Start fosfomycin once daily - ask pharmacy how to mix it.

## 2023-12-02 LAB — URINE CULTURE: Organism ID, Bacteria: NO GROWTH

## 2023-12-03 ENCOUNTER — Ambulatory Visit: Payer: Self-pay | Admitting: Urgent Care

## 2023-12-11 ENCOUNTER — Ambulatory Visit (HOSPITAL_COMMUNITY): Admission: RE | Admit: 2023-12-11 | Source: Ambulatory Visit | Attending: Cardiology | Admitting: Cardiology

## 2023-12-15 ENCOUNTER — Telehealth (HOSPITAL_COMMUNITY): Payer: Self-pay | Admitting: Cardiology

## 2023-12-15 NOTE — Telephone Encounter (Signed)
 Patient NO SHOWED GXT on 12/11/23. We will no reach out to reschedule patient due to his very high NO SHOW RATE of 21%. If patient calls to reschedule we will do so. Order will be removed from the active echo WQ. Thank you.

## 2024-01-02 ENCOUNTER — Ambulatory Visit: Admitting: Urgent Care

## 2024-01-09 ENCOUNTER — Ambulatory Visit: Admitting: Physician Assistant

## 2024-01-09 ENCOUNTER — Encounter: Payer: Self-pay | Admitting: Physician Assistant

## 2024-01-09 VITALS — BP 131/87 | HR 71

## 2024-01-09 DIAGNOSIS — D229 Melanocytic nevi, unspecified: Secondary | ICD-10-CM

## 2024-01-09 DIAGNOSIS — L821 Other seborrheic keratosis: Secondary | ICD-10-CM

## 2024-01-09 DIAGNOSIS — Z1283 Encounter for screening for malignant neoplasm of skin: Secondary | ICD-10-CM

## 2024-01-09 DIAGNOSIS — L578 Other skin changes due to chronic exposure to nonionizing radiation: Secondary | ICD-10-CM | POA: Diagnosis not present

## 2024-01-09 DIAGNOSIS — W908XXA Exposure to other nonionizing radiation, initial encounter: Secondary | ICD-10-CM

## 2024-01-09 DIAGNOSIS — D489 Neoplasm of uncertain behavior, unspecified: Secondary | ICD-10-CM | POA: Diagnosis not present

## 2024-01-09 DIAGNOSIS — D225 Melanocytic nevi of trunk: Secondary | ICD-10-CM | POA: Diagnosis not present

## 2024-01-09 DIAGNOSIS — L814 Other melanin hyperpigmentation: Secondary | ICD-10-CM

## 2024-01-09 DIAGNOSIS — D1801 Hemangioma of skin and subcutaneous tissue: Secondary | ICD-10-CM

## 2024-01-09 NOTE — Patient Instructions (Signed)

## 2024-01-09 NOTE — Progress Notes (Signed)
 New Patient Visit   Subjective  Paul Freeman is a 48 y.o. male NEW PATIENT who presents for the following:  Total Body Skin Exam (TBSE)  Patient present today for new patient visit for TBSE.The patient reports he  has spots, moles and lesions to be evaluated on his scalp, some may be new or changing and the patient may have concern these could be cancer. Patient has previously seen a dermatologist. Patient reports he  does not have hx of bx. Patient admits to family history of skin cancers, patient states his mother but he is not sure of what kind. Patient reports throughout his lifetime has had minimal sun exposure. Currently, patient reports if he  has excessive sun exposure, he  does apply sunscreen and/or wears protective coverings.  The following portions of the chart were reviewed this encounter and updated as appropriate: medications, allergies, medical history  Review of Systems:  No other skin or systemic complaints except as noted in HPI or Assessment and Plan.  Objective  Well appearing patient in no apparent distress; mood and affect are within normal limits.  A full examination was performed including scalp, head, eyes, ears, nose, lips, neck, chest, axillae, abdomen, back, buttocks, bilateral upper extremities, bilateral lower extremities, hands, feet, fingers, toes, fingernails, and toenails. All findings within normal limits unless otherwise noted below.     Relevant exam findings are noted in the Assessment and Plan.  Left Breast 0.6 irregular brown macule     Assessment & Plan   LENTIGINES, SEBORRHEIC KERATOSES, HEMANGIOMAS - Benign normal skin lesions - Benign-appearing - Call for any changes  MELANOCYTIC NEVI - Tan-brown and/or pink-flesh-colored symmetric macules and papules - Benign appearing on exam today - Observation - Call clinic for new or changing moles - Recommend daily use of broad spectrum spf 30+ sunscreen to sun-exposed areas.   ACTINIC  DAMAGE - Chronic condition, secondary to cumulative UV/sun exposure - diffuse scaly erythematous macules with underlying dyspigmentation - Recommend daily broad spectrum sunscreen SPF 30+ to sun-exposed areas, reapply every 2 hours as needed.  - Staying in the shade or wearing long sleeves, sun glasses (UVA+UVB protection) and wide brim hats (4-inch brim around the entire circumference of the hat) are also recommended for sun protection.  - Call for new or changing lesions.  SKIN CANCER SCREENING PERFORMED TODAY NEOPLASM OF UNCERTAIN BEHAVIOR Left Breast Epidermal / dermal shaving  Lesion diameter (cm):  0.6 Informed consent: discussed and consent obtained   Timeout: patient name, date of birth, surgical site, and procedure verified   Procedure prep:  Patient was prepped and draped in usual sterile fashion Prep type:  Isopropyl alcohol Anesthesia: the lesion was anesthetized in a standard fashion   Anesthetic:  1% lidocaine  w/ epinephrine 1-100,000 buffered w/ 8.4% NaHCO3 Instrument used: flexible razor blade   Hemostasis achieved with: pressure and aluminum chloride   Outcome: patient tolerated procedure well   Post-procedure details: sterile dressing applied and wound care instructions given   Dressing type: bandage and petrolatum    Specimen 1 - Surgical pathology Differential Diagnosis:0.6 cm  DN vs MM  Check Margins: No LENTIGINES   ACTINIC SKIN DAMAGE   CHERRY ANGIOMA   MULTIPLE BENIGN NEVI   SEBORRHEIC KERATOSIS   SKIN CANCER SCREENING    Return in about 1 year (around 01/08/2025) for TBSE FOLLOW UP .  I, Doyce Pan, CMA, am acting as scribe for [employer], PA-C.   Documentation: I have reviewed the above documentation for  accuracy and completeness, and I agree with the above.  Cythnia Osmun K, PA-C

## 2024-01-11 LAB — SURGICAL PATHOLOGY

## 2024-01-14 ENCOUNTER — Ambulatory Visit: Payer: Self-pay | Admitting: Physician Assistant

## 2024-02-13 ENCOUNTER — Ambulatory Visit: Admitting: Urgent Care

## 2024-02-13 VITALS — BP 120/65 | HR 72 | Ht 74.0 in | Wt 276.0 lb

## 2024-02-13 DIAGNOSIS — G5701 Lesion of sciatic nerve, right lower limb: Secondary | ICD-10-CM | POA: Diagnosis not present

## 2024-02-13 DIAGNOSIS — M25871 Other specified joint disorders, right ankle and foot: Secondary | ICD-10-CM

## 2024-02-13 DIAGNOSIS — M7671 Peroneal tendinitis, right leg: Secondary | ICD-10-CM | POA: Diagnosis not present

## 2024-02-13 NOTE — Patient Instructions (Signed)
 I have attached two separate PT forms. Please complete both of these.  Use the dancers pads as described to help with your sesamoiditis. Let me know if you continue to have symptoms after two weeks for possible imaging.

## 2024-02-13 NOTE — Progress Notes (Unsigned)
" ° °  Established Patient Office Visit  Subjective:  Patient ID: Paul Freeman, male    DOB: 1975-03-05  Age: 49 y.o. MRN: [id number]  Chief Complaint  Patient presents with   Foot Pain    Right ball of foot feels numb and goes up the leg    HPI  {History (Optional):23778}  ROS: as noted in HPI  Objective:     BP 120/65   Pulse 72   Ht 6' 2 (1.88 m)   Wt 276 lb (125.2 kg)   SpO2 95%   BMI 35.44 kg/m  BP Readings from Last 3 Encounters:  02/13/24 120/65  01/09/24 131/87  11/30/23 115/75   Wt Readings from Last 3 Encounters:  02/13/24 276 lb (125.2 kg)  11/30/23 270 lb (122.5 kg)  11/30/23 271 lb 9.6 oz (123.2 kg)      Physical Exam   No results found for any visits on 02/13/24.  {Labs (Optional):23779}  The ASCVD Risk score (Arnett DK, et al., 2019) failed to calculate for the following reasons:   The valid total cholesterol range is 130 to 320 mg/dL  Assessment & Plan:  There are no diagnoses linked to this encounter.   No follow-ups on file.   Benton LITTIE Gave, PA "

## 2024-02-14 ENCOUNTER — Encounter: Payer: Self-pay | Admitting: Urgent Care

## 2024-02-27 ENCOUNTER — Other Ambulatory Visit: Payer: Self-pay | Admitting: Urgent Care

## 2024-02-27 DIAGNOSIS — M25871 Other specified joint disorders, right ankle and foot: Secondary | ICD-10-CM

## 2024-02-28 ENCOUNTER — Ambulatory Visit: Admitting: Urgent Care

## 2024-02-28 ENCOUNTER — Ambulatory Visit

## 2024-02-28 DIAGNOSIS — M79671 Pain in right foot: Secondary | ICD-10-CM

## 2024-02-28 DIAGNOSIS — M25871 Other specified joint disorders, right ankle and foot: Secondary | ICD-10-CM

## 2024-02-29 ENCOUNTER — Ambulatory Visit: Payer: Self-pay | Admitting: Urgent Care

## 2024-02-29 DIAGNOSIS — M7671 Peroneal tendinitis, right leg: Secondary | ICD-10-CM

## 2024-02-29 DIAGNOSIS — M5416 Radiculopathy, lumbar region: Secondary | ICD-10-CM | POA: Diagnosis not present

## 2024-02-29 DIAGNOSIS — M25871 Other specified joint disorders, right ankle and foot: Secondary | ICD-10-CM

## 2024-03-01 MED ORDER — BACLOFEN 10 MG PO TABS: 10.0000 mg | ORAL_TABLET | Freq: Three times a day (TID) | ORAL | 0 refills | Status: AC

## 2024-03-01 MED ORDER — PREDNISONE 10 MG (21) PO TBPK: ORAL_TABLET | Freq: Every day | ORAL | 0 refills | Status: AC

## 2024-03-01 NOTE — Telephone Encounter (Signed)

## 2024-03-04 NOTE — Telephone Encounter (Signed)
 Orders Placed This Encounter  Procedures   MR Lumbar Spine Wo Contrast    Standing Status:   Future    Expiration Date:   03/01/2025    What is the patient's sedation requirement?:   No Sedation    Does the patient have a pacemaker or implanted devices?:   No    Preferred imaging location?:   [facility] (table limit-500lbs)   Ambulatory referral to Physical Therapy    Referral Priority:   Routine    Referral Type:   Physical Medicine    Referral Reason:   Specialty Services Required    Requested Specialty:   Physical Therapy    Number of Visits Requested:   1   Ambulatory referral to Podiatry    Referral Priority:   Routine    Referral Type:   Consultation    Referral Reason:   Specialty Services Required    Requested Specialty:   Podiatry    Number of Visits Requested:   1   In regard to MR of foot and let will lave up to [place].  He may want to consult with Podiatry before additiona imaging.

## 2024-03-04 NOTE — Telephone Encounter (Signed)
 Forwarding message to Dr. Alvan covering Benton Gave Pended a referral for podiatry

## 2024-03-04 NOTE — Addendum Note (Signed)
 Addended by: Alexzandra Bilton P on: 03/04/2024 08:20 AM   Modules accepted: Orders

## 2024-03-04 NOTE — Addendum Note (Signed)
 Addended by: Taysean Wager D on: 03/04/2024 01:36 PM   Modules accepted: Orders

## 2024-03-10 ENCOUNTER — Other Ambulatory Visit

## 2024-03-14 ENCOUNTER — Encounter: Payer: Self-pay | Admitting: Podiatry

## 2024-03-14 ENCOUNTER — Ambulatory Visit: Admitting: Podiatry

## 2024-03-14 DIAGNOSIS — G5791 Unspecified mononeuropathy of right lower limb: Secondary | ICD-10-CM

## 2024-03-14 NOTE — Progress Notes (Signed)
 "  Subjective:  Patient ID: Paul Freeman, male    DOB: 06-01-1975,   MRN: [id number]  Chief Complaint  Patient presents with   Foot Pain    I got this weird sensation in my foot on the ball of my right foot.  Feels like there's gauze pads that have been stuffed in my shoe, it feels puffy.  My leg goes numb.  It seems to be related to posture too.  Dr. Lowella put me on Prednisone , it seemed to have helped 70%.    49 y.o. male presents for concern of weird sensation in the ball of his right foot.  He relates that has been ongoing for about a year.  He relates he feels like there is a sock that is bunched up underneath his big toe area.  He relates it feels and puffy at times.  He does relate his leg will go numb and will have pain in his left buttock at times as well.  He has discussed this with his primary care and is scheduled to get MRI of lumbar spine.  Here to have her foot checked out and make sure no other issues with this.  He does relate he was seen a [facility] and has been wearing inserts and does believe this is helping some with his feet.. Denies any other pedal complaints. Denies n/v/f/c.   Past Medical History:  Diagnosis Date   Allergy    Possible Penicillin, Pollen   Anal fissure    Anxiety    Arthritis    Depression    Dysplastic nevus 01/09/2024   Left breast - needs excision   G6P deficiency (glucose-6-phosphatase deficiency) (HCC)    Hx of adenomatous polyp of colon 09/13/2021   Hypertension    Mild hyperlipidemia    Pneumonia     Objective:  Physical Exam: Vascular: DP/PT pulses 2/4 bilateral. CFT <3 seconds. Normal hair growth on digits. No edema.  Skin. No lacerations or abrasions bilateral feet.  Musculoskeletal: MMT 5/5 bilateral lower extremities in DF, PF, Inversion and Eversion. Deceased ROM in DF of ankle joint.  No major deformities noted.  No significant pain to palpation.  No Mulder's click noted.  No pain with metatarsal squeeze.  No pain with  range of motion of the first MPJ ankle or subtalar joints.  Neutral arch noted. Neurological: Sensation intact to light touch.   Assessment:   1. Neuritis of right foot      Plan:  Patient was evaluated and treated and all questions answered. X-rays reviewed and discussed with patient.  No acute fractures or dislocations noted.  Some mild degenerative changes noted in the midfoot. Reviewed notes and imaging from PCP.  Discussed neuroma vs radiculopathy  and treatment options with patient.  Discussed not clear what could be causing these changes and further evaluation is needed and uncertain of prognosis. Discussed likely with symptoms he is having down his leg and that this is related to a lumbar issue.  He has no major signs of neuroma present.  Will try a metatarsal pad to see if this does provide any benefit. Advised to continue with the good feet inserts as these seem to be helping.  Did discuss that sometimes with a change in his arch height that could be affecting the lower back and supporting the arch can help with this so would continue with this. Discussed padding and offloading today.  Follow-up on lumbar MRI Discussed if pain does not improve and  MRI for lower back is negative would consider MRI of the foot.  Do not believe necessary at this time unless another etiology is not found. Patient to return as needed     Asberry Failing, DPM    "

## 2024-03-15 ENCOUNTER — Ambulatory Visit: Admitting: Urgent Care

## 2024-03-15 VITALS — BP 109/67 | HR 75 | Temp 97.9°F | Ht 74.0 in | Wt 276.0 lb

## 2024-03-15 DIAGNOSIS — J029 Acute pharyngitis, unspecified: Secondary | ICD-10-CM

## 2024-03-15 DIAGNOSIS — D75A Glucose-6-phosphate dehydrogenase (G6PD) deficiency without anemia: Secondary | ICD-10-CM

## 2024-03-15 DIAGNOSIS — R17 Unspecified jaundice: Secondary | ICD-10-CM

## 2024-03-15 DIAGNOSIS — R5382 Chronic fatigue, unspecified: Secondary | ICD-10-CM

## 2024-03-15 LAB — POCT RAPID STREP A (OFFICE): Rapid Strep A Screen: NEGATIVE

## 2024-03-15 NOTE — Progress Notes (Unsigned)
 "  Established Patient Office Visit  Subjective:  Patient ID: Paul Freeman, male    DOB: 1975/06/22  Age: 49 y.o. 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   Cervical disc disease 02/24/2019   Elevated coronary artery calcium  score 02/24/2019   History of hyperthyroidism 02/24/2019   Numbness of face 01/02/2019   Situational stress 09/19/2018   Neck pain 09/19/2018   G6PD deficiency Mild 09/19/2018   Non-restorative sleep 01/03/2018   Other chronic pain 01/03/2018   Retrognathia 01/03/2018   Loud snoring 01/03/2018   Myalgia 01/03/2018   Chronic fatigue 01/03/2018   Hypertension 06/23/2017   Anxiety    Depression    Past Medical History:  Diagnosis Date   Allergy    Possible Penicillin, Pollen   Anal fissure    Anxiety    Arthritis    Depression    Dysplastic nevus 01/09/2024   Left breast - needs excision   G6P deficiency (glucose-6-phosphatase deficiency) (HCC)    Hx of adenomatous polyp of colon 09/13/2021   Hypertension    Mild hyperlipidemia    Pneumonia    Past Surgical History:  Procedure Laterality Date   APPENDECTOMY     CORONARY STENT INTERVENTION N/A 05/11/2023   Procedure: CORONARY STENT INTERVENTION;  Surgeon: Ladona Heinz, MD;  Location: [facility];  Service: Cardiovascular;  Laterality: N/A;   CYSTECTOMY  2008   cyst removal from left armpit   LEFT HEART CATH AND CORONARY ANGIOGRAPHY N/A 05/11/2023   Procedure: LEFT HEART CATH AND CORONARY ANGIOGRAPHY;  Surgeon: Ladona Heinz, MD;  Location: [facility];  Service: Cardiovascular;  Laterality: N/A;   Social History[1]    ROS: as noted in  HPI  Objective:     BP 109/67   Pulse 75   Temp 97.9 F (36.6 C)   Ht 6' 2 (1.88 m)   Wt 276 lb (125.2 kg)   SpO2 95%   BMI 35.44 kg/m  BP Readings from Last 3 Encounters:  03/15/24 109/67  02/13/24 120/65  01/09/24 131/87   Wt Readings from Last 3 Encounters:  03/15/24 276 lb (125.2 kg)  02/13/24 276 lb (125.2 kg)  11/30/23 270 lb (122.5 kg)      Physical Exam   No results found for any visits on 03/15/24.  Last CBC Lab Results  Component Value Date   WBC 7.8 05/09/2023   HGB 16.3 05/09/2023   HCT 48.0 05/09/2023   MCV 90 05/09/2023   MCH 30.6 05/09/2023   RDW 12.3 05/09/2023   PLT 218 05/09/2023   Last metabolic panel Lab Results  Component Value Date   GLUCOSE 89 10/12/2023   NA 140 10/12/2023   K 4.4 10/12/2023   CL 99 10/12/2023   CO2 24 10/12/2023   BUN 11 10/12/2023   CREATININE 1.15 10/12/2023   EGFR 79 10/12/2023   CALCIUM  10.0 10/12/2023   PHOS 3.1 10/12/2023   PROT 7.3 10/12/2023   ALBUMIN 5.1 10/12/2023   LABGLOB 2.2 10/12/2023   AGRATIO 2.2 09/28/2021   BILITOT 1.7 (H) 10/12/2023   ALKPHOS 92 10/12/2023   AST  24 10/12/2023   ALT 34 10/12/2023   ANIONGAP 7 08/16/2022   Last lipids Lab Results  Component Value Date   CHOL 123 07/19/2023   HDL 38 (L) 07/19/2023   LDLCALC 61 07/19/2023   TRIG 139 07/19/2023   CHOLHDL 3.2 07/19/2023   Last hemoglobin A1c Lab Results  Component Value Date   HGBA1C 4.8 09/01/2023   Last thyroid  functions Lab Results  Component Value Date   TSH 0.464 09/01/2023   FREET4 1.3 07/09/2021   Last vitamin D  Lab Results  Component Value Date   VD25OH 75 05/16/2022   Last vitamin B12 and Folate Lab Results  Component Value Date   VITAMINB12 >2,000 (H) 05/16/2022   FOLATE 9.3 07/09/2021      The ASCVD Risk score (Arnett DK, et al., 2019) failed to calculate for the following reasons:   The valid total cholesterol range is 130 to 320 mg/dL  Assessment & Plan:  Sore throat     No  follow-ups on file.   Benton LITTIE Gave, PA    [1]  Social History Tobacco Use   Smoking status: Former    Current packs/day: 0.00    Average packs/day: 1.5 packs/day for 15.0 years (22.5 ttl pk-yrs)    Types: Cigarettes    Start date: 02/08/1996    Quit date: 02/08/2011    Years since quitting: 13.1   Smokeless tobacco: Never  Vaping Use   Vaping status: Never Used  Substance Use Topics   Alcohol use: Not Currently    Comment: Social once per quarter drinking.   Drug use: No   "

## 2024-03-15 NOTE — Patient Instructions (Addendum)
 Your strep test is negative. Please review the G6PD food guide, keep in mind OTC meds can worsen this condition. Tylenol  is preferred over advil. Do not take aspirin .  Please go to [address] to reschedule your MRI of your back.

## 2024-03-17 ENCOUNTER — Other Ambulatory Visit

## 2024-04-04 ENCOUNTER — Encounter: Admitting: Dermatology

## 2025-01-15 ENCOUNTER — Ambulatory Visit: Admitting: Physician Assistant
